# Patient Record
Sex: Female | Born: 1960 | Race: White | Hispanic: No | Marital: Married | Smoking: Never smoker
Health system: Southern US, Community
[De-identification: ages and names within clinical notes are randomized; demographics above are authoritative.]

## PROBLEM LIST (undated history)

## (undated) DIAGNOSIS — F32A Depression, unspecified: Secondary | ICD-10-CM

## (undated) DIAGNOSIS — M2040 Other hammer toe(s) (acquired), unspecified foot: Secondary | ICD-10-CM

## (undated) DIAGNOSIS — U071 COVID-19: Secondary | ICD-10-CM

## (undated) DIAGNOSIS — B019 Varicella without complication: Secondary | ICD-10-CM

## (undated) DIAGNOSIS — E119 Type 2 diabetes mellitus without complications: Secondary | ICD-10-CM

## (undated) DIAGNOSIS — Z973 Presence of spectacles and contact lenses: Secondary | ICD-10-CM

## (undated) DIAGNOSIS — F329 Major depressive disorder, single episode, unspecified: Secondary | ICD-10-CM

## (undated) DIAGNOSIS — Z8349 Family history of other endocrine, nutritional and metabolic diseases: Secondary | ICD-10-CM

## (undated) DIAGNOSIS — D649 Anemia, unspecified: Secondary | ICD-10-CM

## (undated) DIAGNOSIS — O24419 Gestational diabetes mellitus in pregnancy, unspecified control: Secondary | ICD-10-CM

## (undated) DIAGNOSIS — L439 Lichen planus, unspecified: Secondary | ICD-10-CM

## (undated) DIAGNOSIS — G5760 Lesion of plantar nerve, unspecified lower limb: Secondary | ICD-10-CM

## (undated) DIAGNOSIS — F419 Anxiety disorder, unspecified: Secondary | ICD-10-CM

## (undated) HISTORY — DX: Depression, unspecified: F32.A

## (undated) HISTORY — DX: Major depressive disorder, single episode, unspecified: F32.9

## (undated) HISTORY — DX: Lesion of plantar nerve, unspecified lower limb: G57.60

## (undated) HISTORY — DX: Anxiety disorder, unspecified: F41.9

## (undated) HISTORY — DX: Gestational diabetes mellitus in pregnancy, unspecified control: O24.419

## (undated) HISTORY — DX: Family history of other endocrine, nutritional and metabolic diseases: Z83.49

## (undated) HISTORY — DX: Varicella without complication: B01.9

## (undated) HISTORY — DX: Anemia, unspecified: D64.9

## (undated) HISTORY — DX: Other hammer toe(s) (acquired), unspecified foot: M20.40

## (undated) HISTORY — DX: Type 2 diabetes mellitus without complications: E11.9

---
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
## 1985-09-08 DIAGNOSIS — O24419 Gestational diabetes mellitus in pregnancy, unspecified control: Secondary | ICD-10-CM

## 1985-09-08 HISTORY — DX: Gestational diabetes mellitus in pregnancy, unspecified control: O24.419

## 2008-09-08 HISTORY — PX: ENDOMETRIAL ABLATION: SHX621

## 2010-10-10 LAB — HM COLONOSCOPY: HM Colonoscopy: NORMAL

## 2013-05-24 LAB — HEMOGLOBIN A1C: HEMOGLOBIN A1C: 5.6 % (ref 4.0–6.0)

## 2013-05-24 LAB — HEPATIC FUNCTION PANEL
ALT: 13 U/L (ref 7–35)
AST: 17 U/L (ref 13–35)
Alkaline Phosphatase: 57 U/L (ref 25–125)
BILIRUBIN, TOTAL: 0.4 mg/dL
Bilirubin, Direct: 0.12 mg/dL (ref 0.01–0.4)

## 2013-05-24 LAB — BASIC METABOLIC PANEL
BUN: 8 mg/dL (ref 4–21)
CREATININE: 0.6 mg/dL (ref 0.5–1.1)
Glucose: 90 mg/dL
POTASSIUM: 4.1 mmol/L (ref 3.4–5.3)
Sodium: 139 mmol/L (ref 137–147)

## 2013-05-24 LAB — LIPID PANEL
Cholesterol: 205 mg/dL — AB (ref 0–200)
HDL: 76 mg/dL — AB (ref 35–70)
LDL Cholesterol: 114 mg/dL
Triglycerides: 75 mg/dL (ref 40–160)

## 2013-05-24 LAB — CBC AND DIFFERENTIAL
HCT: 43 % (ref 36–46)
Hemoglobin: 14.2 g/dL (ref 12.0–16.0)
Platelets: 209 10*3/uL (ref 150–399)
WBC: 4.9 10*3/mL

## 2013-05-24 LAB — HM PAP SMEAR: HM Pap smear: NEGATIVE

## 2013-06-02 LAB — HM MAMMOGRAPHY: HM Mammogram: NORMAL

## 2015-04-03 ENCOUNTER — Encounter: Payer: Self-pay | Admitting: Family Medicine

## 2015-04-03 ENCOUNTER — Ambulatory Visit (INDEPENDENT_AMBULATORY_CARE_PROVIDER_SITE_OTHER): Payer: Managed Care, Other (non HMO) | Admitting: Family Medicine

## 2015-04-03 VITALS — BP 110/81 | HR 71 | Ht 60.0 in | Wt 178.0 lb

## 2015-04-03 DIAGNOSIS — M25551 Pain in right hip: Secondary | ICD-10-CM

## 2015-04-03 DIAGNOSIS — H9191 Unspecified hearing loss, right ear: Secondary | ICD-10-CM

## 2015-04-03 NOTE — Patient Instructions (Addendum)
Have record of last mammogram, colonoscopy and OBGYN records faxed to [place] at [phone]. I think it would also be helpful to have records from your time in [place]- ED visit and follow up after the ED.   We will call you within a week about your referral to ENT for right sided hearing loss and orthopedics for right hip pain. If you do not hear within 2 weeks, give [place] a call.   See me 3-6 months from now for a physical. Hopefully we will have records to review by then. Come in fasting and we will update your labs that day.   Happy to see you sooner if new issues arise

## 2015-04-03 NOTE — Progress Notes (Signed)
Brittany Reddish, MD Phone: [phone]  Subjective:  Patient presents today to establish care. Moved from [place] after living there last 1.5 years. Chief complaint-noted.   Hearing loss -Brother has otosclerosis. Patient states she has had an intermittent feeling of fullness or irritation in Left ear- has hard time explaining over 607 years but worse over last few months. R ear she notices if she is laying on the left- seems to have trouble hearing. Concerned about hearing loss in the right ear.  ROS- no headache, blurry vision  Right hip pain -1 year of difficulties, seems to be slightly worse. Issue primarily at night if lays on her right side. She tries to move around or try pillows between legs. Husband notices she moves in her sleep-may bewhen she is laying on that side. That leg twitches as if it is uncomfortable and wakes her husband up. Does also have left knee issues and history meniscal degenerative tear it sounds like.   She has pain in the daytime when she pushes on her lateral hip. No groin pain. She is still able to do yoga and gym 2-3x a week and walk or cycle another day of the week. Knows she is overweight and is workign on this.   ROS- no leg weakness, no shaking in daytime, can stop shaking if aware   The following were reviewed and entered/updated in epic: Past Medical History  Diagnosis Date  . Chicken pox   . Depression     Resolved. in 34s- meds and counseling.  . Gestational diabetes 1987    a1c q3 years  . Family history of hemochromatosis     father- screen iron  . Hammer toe    Patient Active Problem List   Diagnosis Date Noted  . Depression   . Gestational diabetes   . Family history of hemochromatosis    Past Surgical History  Procedure Laterality Date  . Endometrial ablation  2010    heavy menstrual bleeding  . Tonsillectomy and adenoidectomy  1965    Family History  Problem Relation Age of Onset  . Hemochromatosis Father   . Hearing  loss Brother     otosclerosis  . Stroke      grandparent  . Hypertension      grandparent    Medications- reviewed and updated No current outpatient prescriptions on file.   No current facility-administered medications for this visit.    Allergies-reviewed and updated Allergies  Allergen Reactions  . Sulfa Antibiotics   . Penicillins Rash    History   Social History  . Marital Status: Married    Spouse Name: N/A  . Number of Children: N/A  . Years of Education: N/A   Social History Main Topics  . Smoking status: Never Smoker   . Smokeless tobacco: Not on file  . Alcohol Use: 6.0 oz/week    10 Standard drinks or equivalent per week  . Drug Use: No  . Sexual Activity: Not on file   Other Topics Concern  . Not on file   Social History Narrative   Married. (husband [name] of Dr. Yong Channel). 2 children grown and married. Will be grandmother December 2016.    Has had 12 moves in 32 years, most recently [place] for 1.5 years, [place] 5 years prior, [place] prior      B.A. In nutrition. Masters in [employer]. In between jobs as Licensed conveyancer.           ROS--See HPI , otherwise full ROS was completed  and negative except as noted above  Objective: BP 110/81 mmHg  Pulse 71  Ht 5' (1.524 m)  Wt 178 lb (80.74 kg)  BMI 34.76 kg/m2 Gen: NAD, resting comfortably HEENT: Mucous membranes are moist. Oropharynx normal. TM normal. Eyes: sclera and lids normal, PERRLA Neck: no thyromegaly, no cervical lymphadenopathy CV: RRR no murmurs rubs or gallops Lungs: CTAB no crackles, wheeze, rhonchi Abdomen: soft/nontender/nondistended/normal bowel sounds. No rebound or guarding.  MSK: pain over right greater trochanter with deep palpation.  Ext: no edema Skin: warm, dry Neuro: 5/5 strength in upper and lower extremities, normal gait, normal reflexes   Assessment/Plan:  Hearing loss, right. No obvious abnormalities on exam but with left ear irritation, right ear  hearing loss reported, and family history of otosclerosis  - Plan: Ambulatory referral to ENT  Right hip pain - suspect right greater trochanteric bursitis. Patient is obese and discussed we could trial injection in office but thought better evaluated by orthopedics for potential injection and u/s guided if needed.   - Plan: Ambulatory referral to Orthopedic Surgery  Mammogram 06/02/13- patient to return for physical in next few months and we will get her set up.  She has endometrial ablation and not clear of menopause status (likely postmenopausal)- getting pap records though Workign on weight loss- has bachelor's in nutrition- knows what she needs to do just has to work towards it.   3-6 months for CPE  Orders Placed This Encounter  Procedures  . Ambulatory referral to Orthopedic Surgery    Referral Priority:  Routine    Referral Type:  Surgical    Referral Reason:  Specialty Services Required    Requested Specialty:  Orthopedic Surgery    Number of Visits Requested:  1  . Ambulatory referral to ENT    Referral Priority:  Routine    Referral Type:  Consultation    Referral Reason:  Specialty Services Required    Requested Specialty:  Otolaryngology    Number of Visits Requested:  1

## 2015-04-04 ENCOUNTER — Encounter: Payer: Self-pay | Admitting: Family Medicine

## 2015-04-04 DIAGNOSIS — F329 Major depressive disorder, single episode, unspecified: Secondary | ICD-10-CM | POA: Insufficient documentation

## 2015-04-04 DIAGNOSIS — Z8632 Personal history of gestational diabetes: Secondary | ICD-10-CM | POA: Insufficient documentation

## 2015-04-04 DIAGNOSIS — Z8349 Family history of other endocrine, nutritional and metabolic diseases: Secondary | ICD-10-CM | POA: Insufficient documentation

## 2015-04-04 DIAGNOSIS — F32A Depression, unspecified: Secondary | ICD-10-CM | POA: Insufficient documentation

## 2015-05-08 ENCOUNTER — Encounter: Payer: Self-pay | Admitting: Family Medicine

## 2015-06-20 ENCOUNTER — Telehealth: Payer: Self-pay | Admitting: Family Medicine

## 2015-06-20 NOTE — Telephone Encounter (Signed)
Pt said she spoke to you about her parents moving to [place] and becoming patients of yours. Your next available for Medicare patient is late Oct 2017. She said her parents will be moving to [facility] in Nov and is asking if you can see them sooner

## 2015-06-20 NOTE — Telephone Encounter (Signed)
See below

## 2015-06-21 NOTE — Telephone Encounter (Signed)
I dont remember the conversation but you can work them in within 6 months- does not have to be a Monday slot but prefer one of the 8:15, 8:45, 1:15, 1:45

## 2015-06-21 NOTE — Telephone Encounter (Signed)
See below

## 2015-06-21 NOTE — Telephone Encounter (Signed)
Daughter would like for them to come in this month but I will check your schedule and see what is available.

## 2015-07-10 ENCOUNTER — Encounter: Payer: Self-pay | Admitting: Family Medicine

## 2015-07-12 ENCOUNTER — Encounter: Payer: Self-pay | Admitting: Family Medicine

## 2015-07-31 ENCOUNTER — Other Ambulatory Visit (HOSPITAL_COMMUNITY)
Admission: RE | Admit: 2015-07-31 | Discharge: 2015-07-31 | Disposition: A | Discharge status: Home / Self Care | Payer: Managed Care, Other (non HMO) | Source: Ambulatory Visit | Attending: Family Medicine | Admitting: Family Medicine

## 2015-07-31 ENCOUNTER — Ambulatory Visit (INDEPENDENT_AMBULATORY_CARE_PROVIDER_SITE_OTHER): Payer: Managed Care, Other (non HMO) | Admitting: Family Medicine

## 2015-07-31 ENCOUNTER — Other Ambulatory Visit: Payer: Self-pay

## 2015-07-31 ENCOUNTER — Encounter: Payer: Self-pay | Admitting: Family Medicine

## 2015-07-31 VITALS — BP 120/82 | HR 87 | Temp 98.6°F | Ht 60.0 in | Wt 178.0 lb

## 2015-07-31 DIAGNOSIS — N898 Other specified noninflammatory disorders of vagina: Secondary | ICD-10-CM

## 2015-07-31 DIAGNOSIS — Z8632 Personal history of gestational diabetes: Secondary | ICD-10-CM

## 2015-07-31 DIAGNOSIS — Z0000 Encounter for general adult medical examination without abnormal findings: Secondary | ICD-10-CM

## 2015-07-31 DIAGNOSIS — Z1283 Encounter for screening for malignant neoplasm of skin: Secondary | ICD-10-CM

## 2015-07-31 DIAGNOSIS — Z01411 Encounter for gynecological examination (general) (routine) with abnormal findings: Secondary | ICD-10-CM | POA: Insufficient documentation

## 2015-07-31 DIAGNOSIS — N760 Acute vaginitis: Secondary | ICD-10-CM | POA: Insufficient documentation

## 2015-07-31 DIAGNOSIS — Z23 Encounter for immunization: Secondary | ICD-10-CM | POA: Diagnosis not present

## 2015-07-31 DIAGNOSIS — Z8349 Family history of other endocrine, nutritional and metabolic diseases: Secondary | ICD-10-CM

## 2015-07-31 DIAGNOSIS — Z1231 Encounter for screening mammogram for malignant neoplasm of breast: Secondary | ICD-10-CM

## 2015-07-31 LAB — HEMOGLOBIN A1C: HEMOGLOBIN A1C: 5.7 % (ref 4.6–6.5)

## 2015-07-31 LAB — COMPREHENSIVE METABOLIC PANEL
ALK PHOS: 55 U/L (ref 39–117)
ALT: 15 U/L (ref 0–35)
AST: 16 U/L (ref 0–37)
Albumin: 4.4 g/dL (ref 3.5–5.2)
BILIRUBIN TOTAL: 0.6 mg/dL (ref 0.2–1.2)
BUN: 12 mg/dL (ref 6–23)
CALCIUM: 9.3 mg/dL (ref 8.4–10.5)
CO2: 29 meq/L (ref 19–32)
Chloride: 100 mEq/L (ref 96–112)
Creatinine, Ser: 0.62 mg/dL (ref 0.40–1.20)
GFR: 106.12 mL/min (ref >60.00)
Glucose, Bld: 101 mg/dL — ABNORMAL HIGH (ref 70–99)
Potassium: 4.1 mEq/L (ref 3.5–5.1)
Sodium: 136 mEq/L (ref 135–145)
Total Protein: 6.9 g/dL (ref 6.0–8.3)

## 2015-07-31 LAB — CBC
HEMATOCRIT: 42.8 % (ref 36.0–46.0)
Hemoglobin: 14.3 g/dL (ref 12.0–15.0)
MCHC: 33.4 g/dL (ref 30.0–36.0)
MCV: 89.2 fl (ref 78.0–100.0)
Platelets: 175 10*3/uL (ref 150.0–400.0)
RBC: 4.8 Mil/uL (ref 3.87–5.11)
RDW: 13.6 % (ref 11.5–15.5)
WBC: 4.2 10*3/uL (ref 4.0–10.5)

## 2015-07-31 LAB — FERRITIN: Ferritin: 80.1 ng/mL (ref 10.0–291.0)

## 2015-07-31 LAB — LIPID PANEL
CHOLESTEROL: 230 mg/dL — AB (ref 0–200)
HDL: 83.2 mg/dL (ref >39.00)
LDL Cholesterol: 136 mg/dL — ABNORMAL HIGH (ref 0–99)
NONHDL: 147.13
Total CHOL/HDL Ratio: 3
Triglycerides: 54 mg/dL (ref 0.0–149.0)
VLDL: 10.8 mg/dL (ref 0.0–40.0)

## 2015-07-31 LAB — TSH: TSH: 2.41 u[IU]/mL (ref 0.35–4.50)

## 2015-07-31 NOTE — Progress Notes (Signed)
Brittany Reddish, MD Phone: [phone]  Subjective:  Patient presents today for their annual physical. Chief complaint-noted.   See problem oriented charting- ROS- full  review of systems was completed and negative including no depression, no blurry vision, shakiness, anxiety. No RUQ pain, chest pain, shortness of breath.   The following were reviewed and entered/updated in epic: Past Medical History  Diagnosis Date  . Chicken pox   . Depression     Resolved. in 62s- meds and counseling.  . Gestational diabetes 1987    a1c q3 years  . Family history of hemochromatosis     father- screen iron  . Hammer toe   . Morton's neuroma     per records   Patient Active Problem List   Diagnosis Date Noted  . Depression   . History of gestational diabetes   . Family history of hemochromatosis    Past Surgical History  Procedure Laterality Date  . Endometrial ablation  2010    heavy menstrual bleeding  . Tonsillectomy and adenoidectomy  1965    Family History  Problem Relation Age of Onset  . Hemochromatosis Father   . Hearing loss Brother     otosclerosis  . Stroke      grandparent  . Hypertension      grandparent    Medications- reviewed and updated No current outpatient prescriptions on file.   No current facility-administered medications for this visit.    Allergies-reviewed and updated Allergies  Allergen Reactions  . Sulfa Antibiotics   . Penicillins Rash    Social History   Social History  . Marital Status: Married    Spouse Name: N/A  . Number of Children: N/A  . Years of Education: N/A   Social History Main Topics  . Smoking status: Never Smoker   . Smokeless tobacco: None  . Alcohol Use: 6.0 oz/week    10 Standard drinks or equivalent per week  . Drug Use: No  . Sexual Activity: Not Asked   Other Topics Concern  . None   Social History Narrative   Married. (husband [name] of Dr. Yong Channel). 2 children grown and married. Will be  grandmother December 2016.    Has had 12 moves in 32 years, most recently [place] for 1.5 years, [place] 5 years prior, [place] prior      B.A. In nutrition. Masters in [employer]. In between jobs as Licensed conveyancer.           ROS--See HPI   Objective: BP 120/82 mmHg  Pulse 87  Temp(Src) 98.6 F (37 C)  Ht 5' (1.524 m)  Wt 178 lb (80.74 kg)  BMI 34.76 kg/m2 Gen: NAD, resting comfortably HEENT: Mucous membranes are moist. Oropharynx normal Physical Exam Breast exam normal GYN exam normal except some whitish discharge Neck: no thyromegaly CV: RRR no murmurs rubs or gallops Lungs: CTAB no crackles, wheeze, rhonchi Abdomen: soft/nontender/nondistended/normal bowel sounds. No rebound or guarding.  Ext: no edema Skin: warm, dry Neuro: grossly normal, moves all extremities, PERRLA   Assessment/Plan:  54 y.o. female presenting for annual physical.  Health Maintenance counseling: 1. Anticipatory guidance: Patient counseled regarding regular dental exams, eye exams, wearing seatbelts.  2. Risk factor reduction:  Advised patient of need for regular exercise and diet rich and fruits and vegetables to reduce risk of heart attack and stroke.  3. Immunizations/screenings/ancillary studies Health Maintenance Due  Topic Date Due  . Hepatitis C Screening - today 08[birth year]  . HIV Screening - - today 05/03/1975  .  TETANUS/TDAP - today  05/03/1979  . MAMMOGRAM - given handout 06/03/2015  4. Cervical cancer screening- pap smear today 5. Breast cancer screening-  breast exam today normal and mammogram needs to be updated 6. Colon cancer screening - 10/10/2010 with 10 year repeat 7. Skin cancer screening- would like a referral to dermatology [facility] Dr. Wilhemina Bonito  History gestational diabetes- repeat a1c today. Encouraged need for healthy eating, regular exercise, weight loss.  Lab Results  Component Value Date   HGBA1C 5.6 05/24/2013  Family history hemochromatosis Serum  transferrin saturation and ferritin along with testing for HFE gene-c282y and h63d mutation. Father is homozygous for h63d mutation  Vaginal discharge a few days- no STD testing requested. Will check BV, yeast through Pap  Orders Placed This Encounter  Procedures  . Flu Vaccine QUAD 36+ mos IM  . Tdap vaccine greater than or equal to 7yo IM  . CBC    [place]  . Comprehensive metabolic panel    [place]    Order Specific Question:  Has the patient fasted?    Answer:  No  . Lipid panel    [place]    Order Specific Question:  Has the patient fasted?    Answer:  No  . Hemoglobin A1c    [place]  . TSH    [place]  . Hemochromatosis DNA-PCR(c282y,h63d)  . Ferritin  . Transferrin Saturation  . Ambulatory referral to Dermatology    Referral Priority:  Routine    Referral Type:  Consultation    Referral Reason:  Specialty Services Required    Requested Specialty:  Dermatology    Number of Visits Requested:  1   Immunization History  Administered Date(s) Administered  . Influenza,inj,Quad PF,36+ Mos 07/31/2015  . Tdap 07/31/2015

## 2015-07-31 NOTE — Patient Instructions (Addendum)
Flu shot received today.  Labs today. Most labs will be back within 2 days. Some may take a few weeks- the hemochromatosis labs  We will call you within a week about your referral to dermatology. I vaguely remember he may be full and not accepting new patients- work around this if we have to If you do not hear within 2 weeks, give [place] a call.   Look forward to meeting your mom and dad as well as grandbaby through pictures!

## 2015-08-01 LAB — CYTOLOGY - PAP

## 2015-08-04 LAB — HEMOCHROMATOSIS DNA-PCR(C282Y,H63D)

## 2015-08-06 LAB — CERVICOVAGINAL ANCILLARY ONLY
Bacterial vaginitis: POSITIVE — AB
Candida vaginitis: NEGATIVE

## 2015-08-06 MED ORDER — METRONIDAZOLE 500 MG PO TABS: 500.0000 mg | ORAL_TABLET | Freq: Two times a day (BID) | ORAL | Status: DC

## 2015-08-06 NOTE — Addendum Note (Signed)
Addended by: Marin Olp on: 08/06/2015 05:27 PM   Modules accepted: Orders

## 2015-08-21 ENCOUNTER — Ambulatory Visit
Admission: RE | Admit: 2015-08-21 | Discharge: 2015-08-21 | Disposition: A | Discharge status: Home / Self Care | Payer: Managed Care, Other (non HMO) | Source: Ambulatory Visit

## 2015-08-21 DIAGNOSIS — Z1231 Encounter for screening mammogram for malignant neoplasm of breast: Secondary | ICD-10-CM

## 2015-10-10 ENCOUNTER — Ambulatory Visit (INDEPENDENT_AMBULATORY_CARE_PROVIDER_SITE_OTHER): Payer: Managed Care, Other (non HMO) | Admitting: Family Medicine

## 2015-10-10 ENCOUNTER — Encounter: Payer: Self-pay | Admitting: Family Medicine

## 2015-10-10 VITALS — BP 104/70 | HR 60 | Temp 98.5°F | Wt 178.0 lb

## 2015-10-10 DIAGNOSIS — J0100 Acute maxillary sinusitis, unspecified: Secondary | ICD-10-CM

## 2015-10-10 MED ORDER — AZITHROMYCIN 250 MG PO TABS: ORAL_TABLET | ORAL | Status: DC

## 2015-10-10 NOTE — Progress Notes (Signed)
PCP: Garret Reddish, MD  Subjective:  Brittany Barton is a 55 y.o. year old very pleasant female patient who presents with sinusitis symptoms including nasal congestion, sinus tenderness -other symptoms include: sore throat starting this morning, some headache. Ear pressure starting about 8 days ago -day of illness:5 -started: Ear pressure last Tuesday got worse after plane flight. On Saturday developed sinus pressure, congestion -Symptoms show no change -previous treatments: neti pot -sick contacts/travel/risks: denies flu exposure.  -Hx of: allergies  ROS-denies fever, SOB, NVD, tooth pain  Pertinent Past Medical History-  Patient Active Problem List   Diagnosis Date Noted  . Depression   . History of gestational diabetes   . Family history of hemochromatosis     Medications- reviewed , none prior to visit  Objective: BP 104/70 mmHg  Pulse 60  Temp(Src) 98.5 F (36.9 C)  Wt 178 lb (80.74 kg) Gen: NAD, resting comfortably HEENT: Turbinates erythematous with yellow drainage, TM normal, pharynx mildly erythematous with no tonsilar exudate or edema, minimal maxillary sinus tenderness CV: RRR no murmurs rubs or gallops Lungs: CTAB no crackles, wheeze, rhonchi Abdomen: soft/nontender/nondistended/normal bowel sounds. No rebound or guarding. obese Ext: no edema Skin: warm, dry, no rash Neuro: grossly normal, moves all extremities  Assessment/Plan:  Sinsusitis Viral based on <10 days, no double sickening, lack of severity of symptoms in first 3 days (fever above 101 and thick green drainage). We also discussed reasons why current illness does not meet criteria for bacterial illness and therefore no antibiotics indicated. Also educated on signs that bacterial infection may have developed on top of viral illness.   Treatment: -considered steroid: opted out due to history of gestational diabetes -other symptomatic care with plain mucinex may help -Antibiotic indicated: no, as I will  be out of town and to date symptoms have not improved, will write prescription for azithromycin to be filled on Monday if symptoms not at least 50% better  Finally, we reviewed reasons to return to care including if symptoms worsen or persist or new concerns arise.  Meds ordered this encounter  Medications  . azithromycin (ZITHROMAX) 250 MG tablet    Sig: Take 2 tabs on day 1, then 1 tab daily until finished    Dispense:  6 tablet    Refill:  0    May fill on 2/6 if symptoms not at least 50% or if febrile before that time or if symptoms improve then worsen

## 2015-10-10 NOTE — Patient Instructions (Signed)
Sinsusitis Viral based on <10 days, no double sickening, lack of severity of symptoms in first 3 days (fever above 101 and thick green drainage). We also discussed reasons why current illness does not meet criteria for bacterial illness and therefore no antibiotics indicated. Also educated on signs that bacterial infection may have developed on top of viral illness.   Treatment: -considered steroid: opted out due to history of gestational diabetes -other symptomatic care with plain mucinex may help -Antibiotic indicated: no, as I will be out of town and to date symptoms have not improved, will write prescription for azithromycin to be filled on Monday if symptoms not at least 50% better  Finally, we reviewed reasons to return to care including if symptoms worsen or persist or new concerns arise.  Meds ordered this encounter  Medications  . azithromycin (ZITHROMAX) 250 MG tablet    Sig: Take 2 tabs on day 1, then 1 tab daily until finished    Dispense:  6 tablet    Refill:  0    May fill on 2/6 if symptoms not at least 50% or if febrile before that time or if symptoms improve then worsen

## 2016-03-13 ENCOUNTER — Telehealth: Payer: Self-pay | Admitting: Family Medicine

## 2016-03-13 ENCOUNTER — Ambulatory Visit (INDEPENDENT_AMBULATORY_CARE_PROVIDER_SITE_OTHER): Payer: Managed Care, Other (non HMO) | Admitting: Family Medicine

## 2016-03-13 ENCOUNTER — Encounter: Payer: Self-pay | Admitting: Family Medicine

## 2016-03-13 VITALS — BP 132/86 | HR 60 | Temp 98.1°F | Ht 61.0 in | Wt 182.0 lb

## 2016-03-13 DIAGNOSIS — R0789 Other chest pain: Secondary | ICD-10-CM | POA: Diagnosis not present

## 2016-03-13 MED ORDER — MELOXICAM 15 MG PO TABS: 15.0000 mg | ORAL_TABLET | Freq: Every day | ORAL | Status: DC

## 2016-03-13 NOTE — Patient Instructions (Signed)
Seems you are tender at your xiphoid process- perhaps in the joint that connects it to the sternum- try to calm down with 7-10 days of antiinflammatory- can also ice area 3x a day for 15 minutes.   Follow up if continued or worsening symptoms

## 2016-03-13 NOTE — Telephone Encounter (Signed)
Patient Name: Brittany Barton  DOB: 1961/02/18    Initial Comment Caller has a pain where bra is in the center where the rib bones area. has a small lump.    Nurse Assessment  Nurse: Raphael Gibney, RN, Vanita Ingles Date/Time (Eastern Time): 03/13/2016 1:38:24 PM  Confirm and document reason for call. If symptomatic, describe symptoms. You must click the next button to save text entered. ---Caller states she has a small lump on her sternum.Marland Kitchen No pain. Has pain with lifting her grandchild. Not red. Noticed lump on Saturday when she lifted her granddaughter.  Has the patient traveled out of the country within the last 30 days? ---Not Applicable  Does the patient have any new or worsening symptoms? ---Yes  Will a triage be completed? ---Yes  Related visit to physician within the last 2 weeks? ---No  Does the PT have any chronic conditions? (i.e. diabetes, asthma, etc.) ---No  Is this a behavioral health or substance abuse call? ---No     Guidelines    Guideline Title Affirmed Question Affirmed Notes  Skin Lump or Localized Swelling [1] New-onset hernia suspected (reducible bulge in groin or abdomen; non-tender) AND [2] NO pain or vomiting    Final Disposition User   See PCP When Office is Open (within 3 days) Raphael Gibney, RN, Vanita Ingles    Comments  Appt scheduled for 4:15 pm 03/13/2016 with Dr. Garret Reddish   Referrals  REFERRED TO PCP OFFICE   Disagree/Comply: Comply

## 2016-03-13 NOTE — Progress Notes (Signed)
Subjective:  Brittany Barton is a 55 y.o. year old very pleasant female patient who presents for/with See problem oriented charting ROS- no shortness of breath, no nausea, no vomiting. Pain not worse after meals- always wtth movement.see any ROS included in HPI as well.   Past Medical History-  Patient Active Problem List   Diagnosis Date Noted  . Depression   . History of gestational diabetes   . Family history of hemochromatosis     Medications- reviewed and updated Current Outpatient Prescriptions  Medication Sig Dispense Refill  . Multiple Vitamins-Minerals (CENTRUM FLAVOR BURST ADULT) CHEW Chew by mouth daily.    . Omega-3 Fatty Acids (OMEGA 3 PO) Take by mouth daily.       Objective: BP 132/86 mmHg  Pulse 60  Temp(Src) 98.1 F (36.7 C) (Oral)  Ht 5\' 1"  (1.549 m)  Wt 182 lb (82.555 kg)  BMI 34.41 kg/m2 Gen: NAD, resting comfortably CV: RRR no murmurs rubs or gallops Lungs: CTAB no crackles, wheeze, rhonchi Abdomen: soft/nontender over entirety of abdomen with exception over xiphod process which patient eventually guards on repeat exam/nondistended/normal bowel sounds. No rebound or guarding otherwise Ext: no edema Skin: warm, dry, no rash over skin Neuro: grossly normal, moves all extremities  Assessment/Plan:  Lower chest pain S: Had some pain in upper abdomen, lower chest when lifted her grandbaby up and felt a pop. Perhaps vague discomfort for a few months including with sit ups- "feels weird there". Then worse starting on Saturday. Some mild intermittent pain- if she sits a certain way it bothers her A/P:on exam patient is very tender over xiphoid process- ? Mild inflammation at joint with sternum- will trial mobic for 7- 10 days and advised follow up if not improved. High suspicion this is MSK in origin. Consider more abdominal focused workup if above not effective vs sports medicine input. Also advised trial of ice. Doubt fracture but possible- could also consider  films  Meds ordered this encounter  Medications  . Multiple Vitamins-Minerals (CENTRUM FLAVOR BURST ADULT) CHEW    Sig: Chew by mouth daily.  . Omega-3 Fatty Acids (OMEGA 3 PO)    Sig: Take by mouth daily.  . meloxicam (MOBIC) 15 MG tablet    Sig: Take 1 tablet (15 mg total) by mouth daily.    Dispense:  10 tablet    Refill:  0    Return precautions advised.  Garret Reddish, MD

## 2016-05-19 IMAGING — MG MM DIGITAL SCREENING BILAT
4 series · 4 of 4 positions shown · non-contrast
Comparison: None.

CLINICAL DATA: Screening.

EXAM:
DIGITAL SCREENING BILATERAL MAMMOGRAM WITH CAD

[L CC]
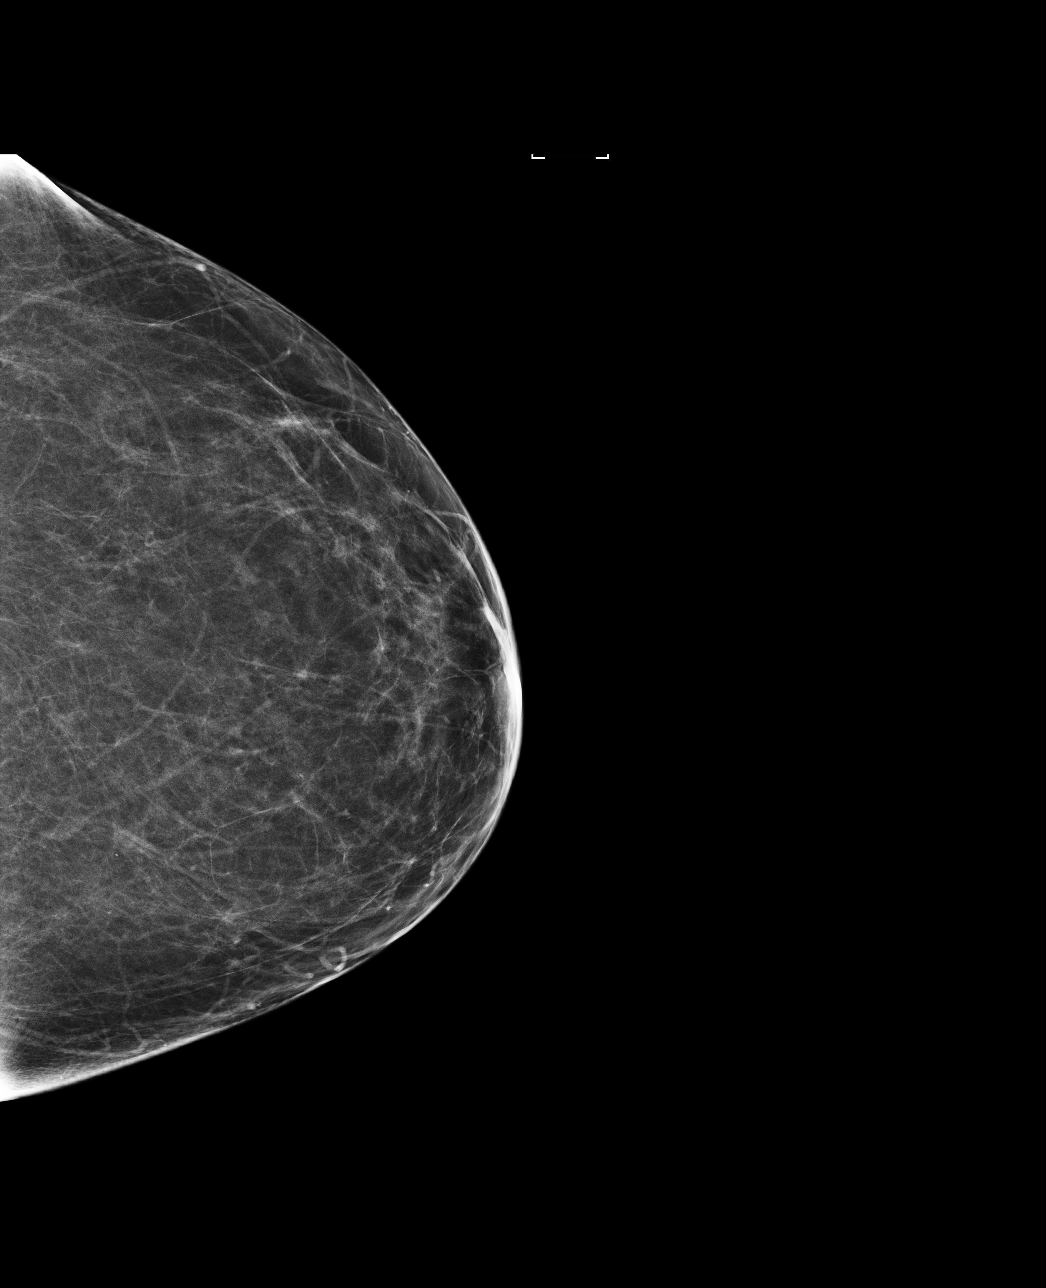

[R CC]
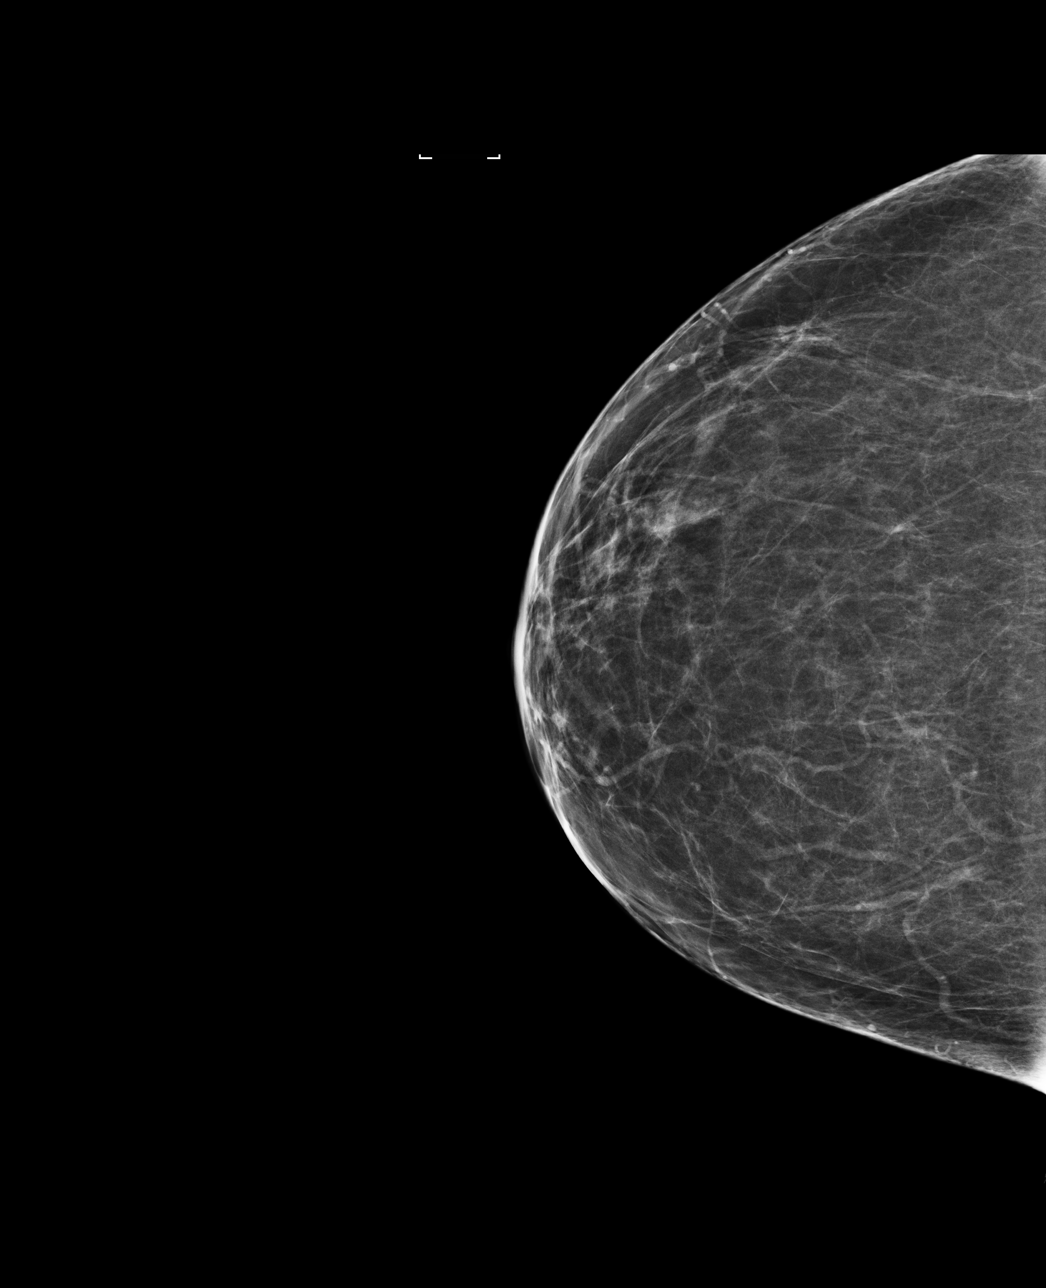

[R MLO]
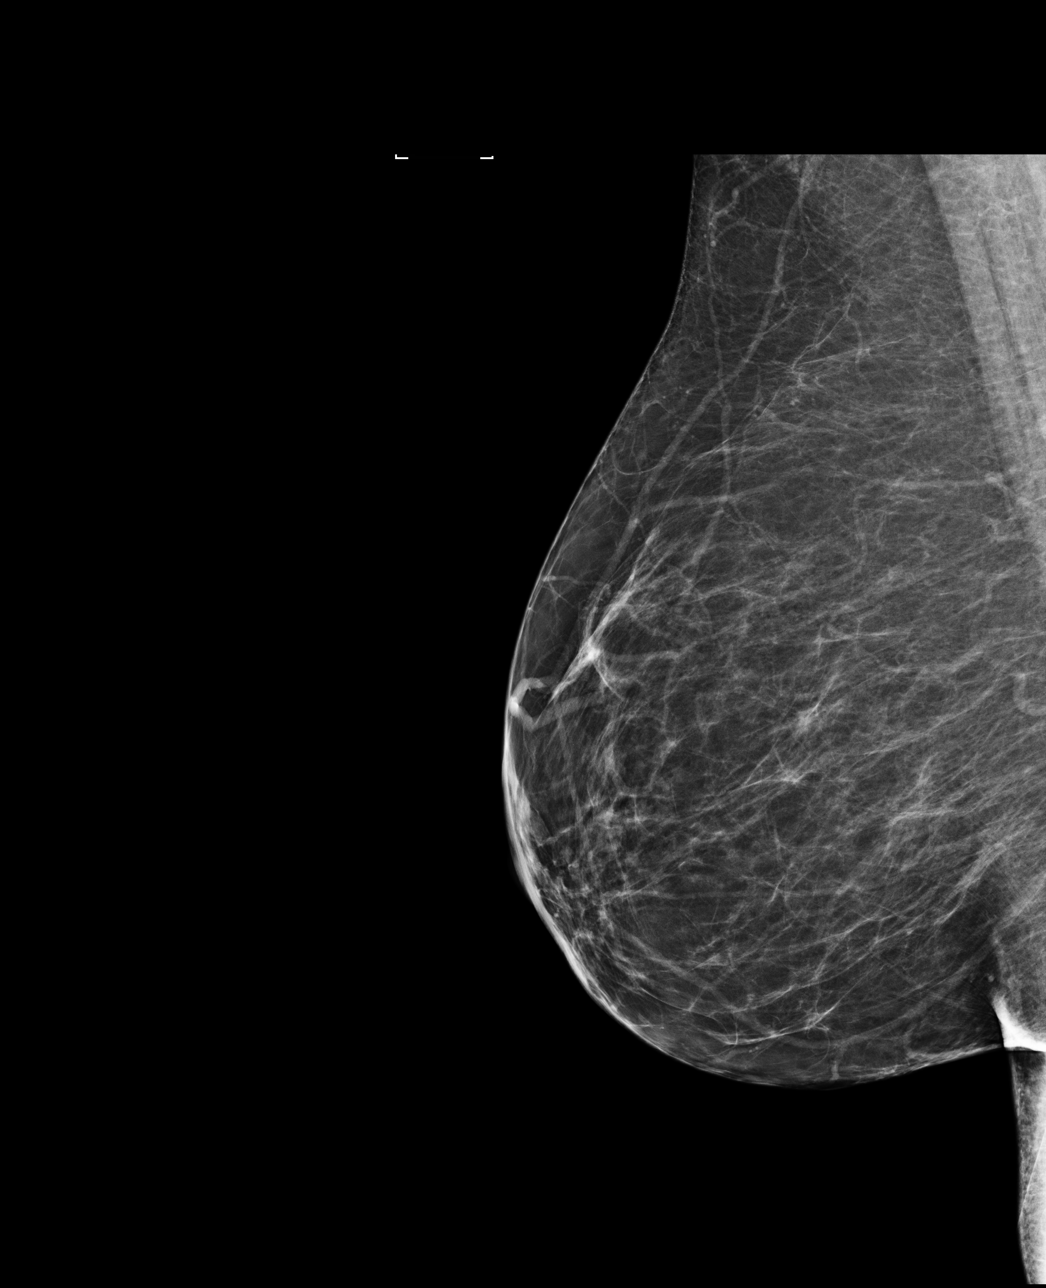

[L MLO]
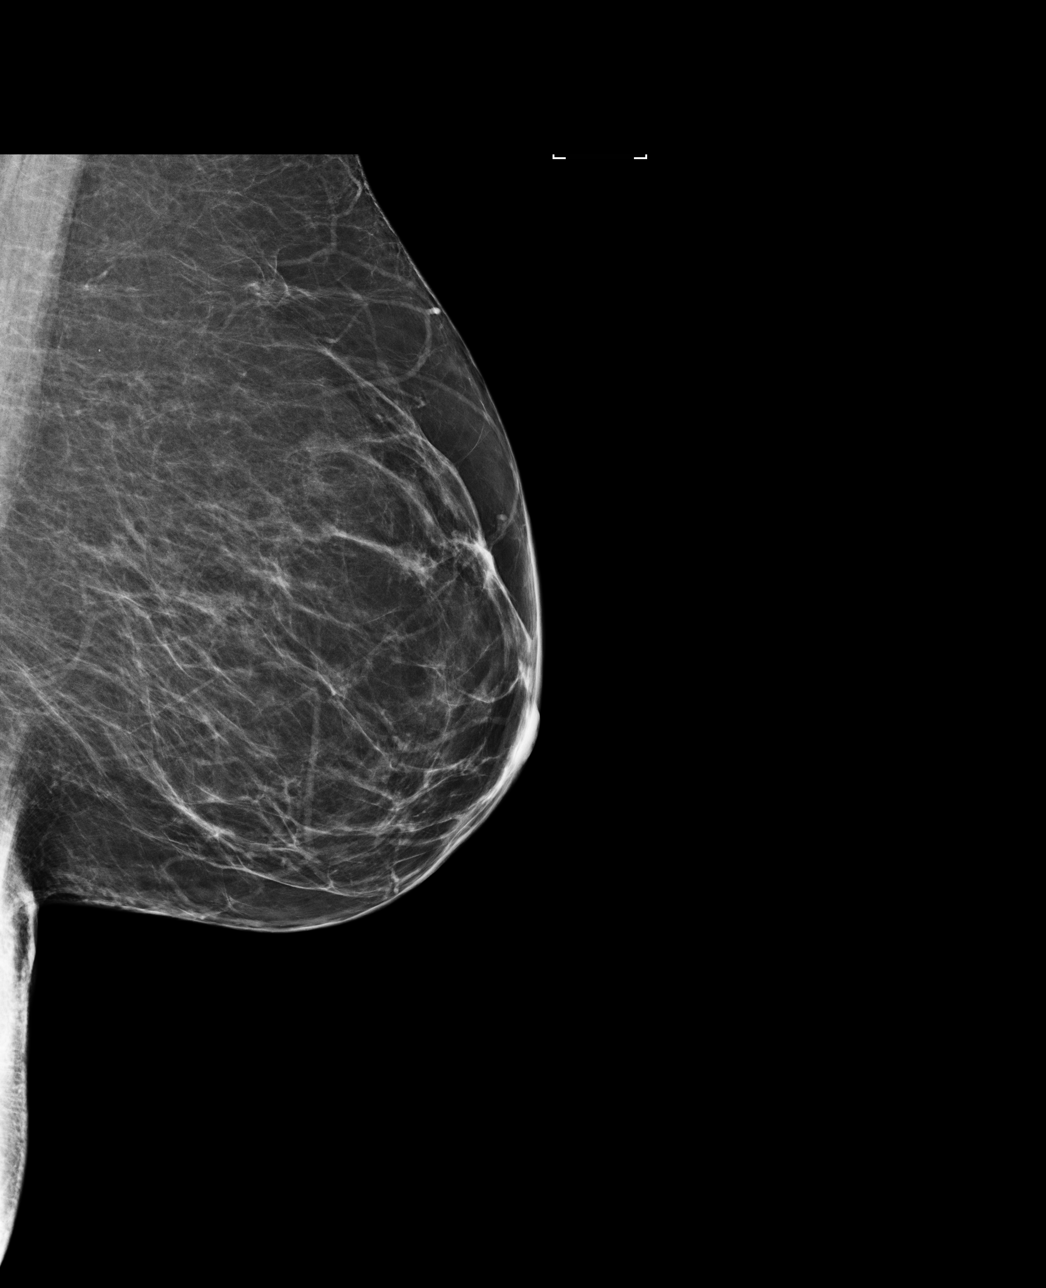

[4 of 4 positions shown; findings below may reference images not displayed]

ACR Breast Density Category b: There are scattered areas of
fibroglandular density.
FINDINGS: There are no findings suspicious for malignancy. Images were
processed with CAD.
IMPRESSION: No mammographic evidence of malignancy. A result letter of this
screening mammogram will be mailed directly to the patient.

RECOMMENDATION:
Screening mammogram in one year. (Code:[id number])

BI-RADS CATEGORY  1: Negative.

## 2016-07-08 LAB — HM DIABETES EYE EXAM

## 2016-07-29 ENCOUNTER — Other Ambulatory Visit (INDEPENDENT_AMBULATORY_CARE_PROVIDER_SITE_OTHER): Payer: Managed Care, Other (non HMO)

## 2016-07-29 DIAGNOSIS — Z0000 Encounter for general adult medical examination without abnormal findings: Secondary | ICD-10-CM | POA: Diagnosis not present

## 2016-07-29 LAB — POC URINALSYSI DIPSTICK (AUTOMATED)
Bilirubin, UA: NEGATIVE
Blood, UA: NEGATIVE
Glucose, UA: NEGATIVE
Ketones, UA: NEGATIVE
Nitrite, UA: NEGATIVE
PH UA: 7
PROTEIN UA: NEGATIVE
SPEC GRAV UA: 1.01
UROBILINOGEN UA: 0.2

## 2016-07-29 LAB — CBC WITH DIFFERENTIAL/PLATELET
BASOS PCT: 0.3 % (ref 0.0–3.0)
Basophils Absolute: 0 10*3/uL (ref 0.0–0.1)
EOS PCT: 1.9 % (ref 0.0–5.0)
Eosinophils Absolute: 0.1 10*3/uL (ref 0.0–0.7)
HEMATOCRIT: 42 % (ref 36.0–46.0)
HEMOGLOBIN: 14.6 g/dL (ref 12.0–15.0)
LYMPHS PCT: 33.4 % (ref 12.0–46.0)
Lymphs Abs: 1.4 10*3/uL (ref 0.7–4.0)
MCHC: 34.7 g/dL (ref 30.0–36.0)
MCV: 85.4 fl (ref 78.0–100.0)
MONOS PCT: 8 % (ref 3.0–12.0)
Monocytes Absolute: 0.3 10*3/uL (ref 0.1–1.0)
Neutro Abs: 2.4 10*3/uL (ref 1.4–7.7)
Neutrophils Relative %: 56.4 % (ref 43.0–77.0)
Platelets: 229 10*3/uL (ref 150.0–400.0)
RBC: 4.92 Mil/uL (ref 3.87–5.11)
RDW: 13 % (ref 11.5–15.5)
WBC: 4.2 10*3/uL (ref 4.0–10.5)

## 2016-07-29 LAB — BASIC METABOLIC PANEL
BUN: 6 mg/dL (ref 6–23)
CHLORIDE: 98 meq/L (ref 96–112)
CO2: 28 mEq/L (ref 19–32)
Calcium: 9.3 mg/dL (ref 8.4–10.5)
Creatinine, Ser: 0.59 mg/dL (ref 0.40–1.20)
GFR: 111.97 mL/min (ref >60.00)
Glucose, Bld: 131 mg/dL — ABNORMAL HIGH (ref 70–99)
POTASSIUM: 4.1 meq/L (ref 3.5–5.1)
Sodium: 134 mEq/L — ABNORMAL LOW (ref 135–145)

## 2016-07-29 LAB — HEPATIC FUNCTION PANEL
ALT: 11 U/L (ref 0–35)
AST: 13 U/L (ref 0–37)
Albumin: 4.4 g/dL (ref 3.5–5.2)
Alkaline Phosphatase: 63 U/L (ref 39–117)
Bilirubin, Direct: 0.1 mg/dL (ref 0.0–0.3)
TOTAL PROTEIN: 6.9 g/dL (ref 6.0–8.3)
Total Bilirubin: 0.6 mg/dL (ref 0.2–1.2)

## 2016-07-29 LAB — LIPID PANEL
CHOL/HDL RATIO: 3
CHOLESTEROL: 177 mg/dL (ref 0–200)
HDL: 61.9 mg/dL (ref >39.00)
LDL Cholesterol: 101 mg/dL — ABNORMAL HIGH (ref 0–99)
NonHDL: 115.56
TRIGLYCERIDES: 73 mg/dL (ref 0.0–149.0)
VLDL: 14.6 mg/dL (ref 0.0–40.0)

## 2016-07-29 LAB — TSH: TSH: 1.36 u[IU]/mL (ref 0.35–4.50)

## 2016-08-04 ENCOUNTER — Ambulatory Visit (INDEPENDENT_AMBULATORY_CARE_PROVIDER_SITE_OTHER): Payer: Managed Care, Other (non HMO) | Admitting: Family Medicine

## 2016-08-04 ENCOUNTER — Encounter: Payer: Self-pay | Admitting: Family Medicine

## 2016-08-04 VITALS — BP 108/78 | HR 66 | Temp 97.9°F | Ht 60.0 in | Wt 164.6 lb

## 2016-08-04 DIAGNOSIS — Z23 Encounter for immunization: Secondary | ICD-10-CM

## 2016-08-04 DIAGNOSIS — Z0000 Encounter for general adult medical examination without abnormal findings: Secondary | ICD-10-CM | POA: Diagnosis not present

## 2016-08-04 DIAGNOSIS — E785 Hyperlipidemia, unspecified: Secondary | ICD-10-CM | POA: Insufficient documentation

## 2016-08-04 DIAGNOSIS — R739 Hyperglycemia, unspecified: Secondary | ICD-10-CM

## 2016-08-04 DIAGNOSIS — B001 Herpesviral vesicular dermatitis: Secondary | ICD-10-CM | POA: Insufficient documentation

## 2016-08-04 LAB — POCT GLYCOSYLATED HEMOGLOBIN (HGB A1C): Hemoglobin A1C: 5.9

## 2016-08-04 MED ORDER — ACYCLOVIR 5 % EX OINT: 1.0000 "application " | TOPICAL_OINTMENT | CUTANEOUS | 1 refills | Status: DC

## 2016-08-04 NOTE — Progress Notes (Signed)
Phone: [phone]  Subjective:  Patient presents today for their annual physical. Chief complaint-noted.   See problem oriented charting- ROS- full  review of systems was completed and negative except for: occasional pain in epigastric area once every few months. No chest pain or shortness of breath with this. Weight loss but intentional. No night sweats.   The following were reviewed and entered/updated in epic: Past Medical History:  Diagnosis Date  . Chicken pox   . Depression    Resolved. in 59s- meds and counseling.  . Family history of hemochromatosis    father- screen iron  . Gestational diabetes 1987   a1c q3 years  . Hammer toe   . Morton's neuroma    per records   Patient Active Problem List   Diagnosis Date Noted  . History of gestational diabetes     Priority: Medium  . Fever blister 08/04/2016    Priority: Low  . Depression     Priority: Low  . Family history of hemochromatosis     Priority: Low  . Hyperlipidemia 08/04/2016   Past Surgical History:  Procedure Laterality Date  . ENDOMETRIAL ABLATION  2010   heavy menstrual bleeding  . TONSILLECTOMY AND ADENOIDECTOMY  1965    Family History  Problem Relation Age of Onset  . Hemochromatosis Father   . Hearing loss Brother     otosclerosis  . Stroke      grandparent  . Hypertension      grandparent    Medications- reviewed and updated Current Outpatient Prescriptions  Medication Sig Dispense Refill  . acyclovir ointment (ZOVIRAX) 5 % Apply 1 application topically every 3 (three) hours. 30 g 1   No current facility-administered medications for this visit.     Allergies-reviewed and updated Allergies  Allergen Reactions  . Sulfa Antibiotics   . Penicillins Rash    Social History   Social History  . Marital status: Married    Spouse name: N/A  . Number of children: N/A  . Years of education: N/A   Social History Main Topics  . Smoking status: Never Smoker  . Smokeless tobacco:  None  . Alcohol use 6.0 oz/week    10 Standard drinks or equivalent per week  . Drug use: No  . Sexual activity: Not Asked   Other Topics Concern  . None   Social History Narrative   Married. (husband [name] of Dr. Yong Channel). 2 children grown and married. Will be grandmother December 2016.    Has had 12 moves in 32 years, most recently [place] for 1.5 years, [place] 5 years prior, [place] prior      B.A. In nutrition. Masters in [employer]. In between jobs as Licensed conveyancer.           Objective: BP 108/78 (BP Location: Left Arm, Patient Position: Sitting, Cuff Size: Normal)   Pulse 66   Temp 97.9 F (36.6 C) (Oral)   Ht 5' (1.524 m)   Wt 164 lb 9.6 oz (74.7 kg)   SpO2 99%   BMI 32.15 kg/m  Gen: NAD, resting comfortably HEENT: Mucous membranes are moist. Oropharynx normal Neck: no thyromegaly CV: RRR no murmurs rubs or gallops Lungs: CTAB no crackles, wheeze, rhonchi Abdomen: soft/nontender/nondistended/normal bowel sounds. No rebound or guarding.  Ext: no edema Skin: warm, dry Neuro: grossly normal, moves all extremities, PERRLA  Assessment/Plan:  55 y.o. female presenting for annual physical.  Health Maintenance counseling: 1. Anticipatory guidance: Patient counseled regarding regular dental exams, eye exams, wearing seatbelts.  2. Risk factor reduction:  Advised patient of need for regular exercise and diet rich and fruits and vegetables to reduce risk of heart attack and stroke.  3. Immunizations/screenings/ancillary studies Immunization History  Administered Date(s) Administered  . Influenza,inj,Quad PF,36+ Mos 07/31/2015  . Tdap 07/31/2015   Health Maintenance Due  Topic Date Due  . Hepatitis C Screening - declined Nov 26, 1960  . HIV Screening -- declined 05/03/1975  . INFLUENZA VACCINE - today 04/08/2016   4. Cervical cancer screening- pap smear 2016- discussed every 3 year recommendation 5. Breast cancer screening-  breast exam (defers until  next gyn exam) and mammogram 08/21/15 normal 6. Colon cancer screening - 10/10/2010 with 10 year repeat 7. Skin cancer screening- saw dermatology last year but was told did not need regular follow up.   Status of chronic or acute concerns  fever blisters- used zovirax in past. About to run out- refilled today  History of gestational diabetes History gestational diabetes- a1c q3 years. Then a1c 5.7 in 2016 and cbg 131 2017 (may not have been fasting completely though).  Get a1c today. Has lost over 15 lbs by going to Essentially vegan since July 6th. Drastic improvement in lipids with bad cholesterol down to 101 from 136. Total down from 230 to 177. Good cholesterol down some as well. Down 17.5 pounds with lifestyle change. Less achy. Had been low on meats but now cut out cheese/all dairy and eggs which did a fair amount of.   Hyperlipidemia See hyperglycemia- weight down 15- primarily vegan.  Lipids look much better- continue current efforts alone- no meds for now  1 year physical  Meds ordered this encounter  Medications  . acyclovir ointment (ZOVIRAX) 5 %    Sig: Apply 1 application topically every 3 (three) hours.    Dispense:  30 g    Refill:  1   Return precautions advised.   Garret Reddish, MD

## 2016-08-04 NOTE — Patient Instructions (Addendum)
Flu shot today.   Great job with weight loss and healthier eating- cholesterol looks a lot better! Wonder if sugar was affected by communion because your a1c remains in at risk range at 5.9. Up slightly- likely would have been worse without your changes to lifestyle. Keep it up Lab Results  Component Value Date   HGBA1C 5.7 07/31/2015   Can check in 6 months if you would like for repeat

## 2016-08-04 NOTE — Assessment & Plan Note (Signed)
History gestational diabetes- a1c q3 years. Then a1c 5.7 in 2016 and cbg 131 2017 (may not have been fasting completely though).  Get a1c today. Has lost over 15 lbs by going to Essentially vegan since July 6th. Drastic improvement in lipids with bad cholesterol down to 101 from 136. Total down from 230 to 177. Good cholesterol down some as well. Down 17.5 pounds with lifestyle change. Less achy. Had been low on meats but now cut out cheese/all dairy and eggs which did a fair amount of.

## 2016-08-04 NOTE — Progress Notes (Signed)
Pre visit review using our clinic review tool, if applicable. No additional management support is needed unless otherwise documented below in the visit note. 

## 2016-08-04 NOTE — Assessment & Plan Note (Signed)
See hyperglycemia- weight down 15- primarily vegan.  Lipids look much better- continue current efforts alone- no meds for now

## 2017-08-05 ENCOUNTER — Encounter: Payer: Self-pay | Admitting: Family Medicine

## 2017-08-05 ENCOUNTER — Ambulatory Visit (INDEPENDENT_AMBULATORY_CARE_PROVIDER_SITE_OTHER): Payer: Managed Care, Other (non HMO) | Admitting: Family Medicine

## 2017-08-05 ENCOUNTER — Other Ambulatory Visit (HOSPITAL_COMMUNITY)
Admission: RE | Admit: 2017-08-05 | Discharge: 2017-08-05 | Disposition: A | Discharge status: Home / Self Care | Payer: Managed Care, Other (non HMO) | Source: Ambulatory Visit | Attending: Family Medicine | Admitting: Family Medicine

## 2017-08-05 VITALS — BP 94/76 | HR 62 | Temp 98.1°F | Ht 60.0 in | Wt 146.0 lb

## 2017-08-05 DIAGNOSIS — B9689 Other specified bacterial agents as the cause of diseases classified elsewhere: Secondary | ICD-10-CM | POA: Insufficient documentation

## 2017-08-05 DIAGNOSIS — Z0000 Encounter for general adult medical examination without abnormal findings: Secondary | ICD-10-CM

## 2017-08-05 DIAGNOSIS — R739 Hyperglycemia, unspecified: Secondary | ICD-10-CM

## 2017-08-05 DIAGNOSIS — E785 Hyperlipidemia, unspecified: Secondary | ICD-10-CM | POA: Diagnosis not present

## 2017-08-05 DIAGNOSIS — Z23 Encounter for immunization: Secondary | ICD-10-CM

## 2017-08-05 DIAGNOSIS — N898 Other specified noninflammatory disorders of vagina: Secondary | ICD-10-CM | POA: Diagnosis present

## 2017-08-05 DIAGNOSIS — Z124 Encounter for screening for malignant neoplasm of cervix: Secondary | ICD-10-CM

## 2017-08-05 MED ORDER — ACYCLOVIR 5 % EX OINT: 1.0000 "application " | TOPICAL_OINTMENT | CUTANEOUS | 1 refills | Status: AC

## 2017-08-05 NOTE — Patient Instructions (Addendum)
Schedule a lab visit at the check out desk within 2 weeks. Return for future fasting labs meaning nothing but water after midnight please. Ok to take your medications with water.   Great job with weight loss! Go spread your knowledge Wt Readings from Last 3 Encounters:  08/05/17 146 lb (66.2 kg)  08/04/16 164 lb 9.6 oz (74.7 kg)  03/13/16 182 lb (82.6 kg)   Update your mammogram

## 2017-08-05 NOTE — Progress Notes (Signed)
Phone: [phone]  Subjective:  Patient presents today for their annual physical. Chief complaint-noted.   See problem oriented charting- ROS- full  review of systems was completed and negative including No chest pain or shortness of breath. No headache or blurry vision. Does have one fever blister from around thanksgiving  The following were reviewed and entered/updated in epic: Past Medical History:  Diagnosis Date  . Chicken pox   . Depression    Resolved. in 23s- meds and counseling.  . Family history of hemochromatosis    father- screen iron  . Gestational diabetes 1987   a1c q3 years  . Hammer toe   . Morton's neuroma    per records   Patient Active Problem List   Diagnosis Date Noted  . History of gestational diabetes     Priority: Medium  . Fever blister 08/04/2016    Priority: Low  . Depression     Priority: Low  . Family history of hemochromatosis     Priority: Low  . Hyperlipidemia 08/04/2016   Past Surgical History:  Procedure Laterality Date  . ENDOMETRIAL ABLATION  2010   heavy menstrual bleeding  . TONSILLECTOMY AND ADENOIDECTOMY  1965    Family History  Problem Relation Age of Onset  . Hemochromatosis Father   . Hearing loss Brother        otosclerosis  . Stroke Unknown        grandparent  . Hypertension Unknown        grandparent    Medications- reviewed and updated Current Outpatient Medications  Medication Sig Dispense Refill  . acyclovir ointment (ZOVIRAX) 5 % Apply 1 application topically every 3 (three) hours. 30 g 1   No current facility-administered medications for this visit.     Allergies-reviewed and updated Allergies  Allergen Reactions  . Sulfa Antibiotics   . Penicillins Rash    Social History   Socioeconomic History  . Marital status: Married    Spouse name: None  . Number of children: None  . Years of education: None  . Highest education level: None  Social Needs  . Financial resource strain: None  . Food  insecurity - worry: None  . Food insecurity - inability: None  . Transportation needs - medical: None  . Transportation needs - non-medical: None  Occupational History  . None  Tobacco Use  . Smoking status: Never Smoker  . Smokeless tobacco: Never Used  Substance and Sexual Activity  . Alcohol use: Yes    Alcohol/week: 6.0 oz    Types: 10 Standard drinks or equivalent per week  . Drug use: No  . Sexual activity: Yes  Other Topics Concern  . None  Social History Narrative   Married. (husband [name] of Dr. Yong Channel). 2 children grown and married. Will be grandmother December 2016.    Has had 12 moves in 32 years, most recently [place] for 1.5 years, [place] 5 years prior, [place] prior      B.A. In nutrition. Masters in [employer]. In between jobs as Licensed conveyancer.        Objective: BP 94/76 (BP Location: Left Arm, Patient Position: Sitting, Cuff Size: Large)   Pulse 62   Temp 98.1 F (36.7 C) (Oral)   Ht 5' (1.524 m)   Wt 146 lb (66.2 kg)   SpO2 97%   BMI 28.51 kg/m  Gen: NAD, resting comfortably HEENT: Mucous membranes are moist. Oropharynx normal Neck: no thyromegaly CV: RRR no murmurs rubs or gallops Lungs: CTAB no  crackles, wheeze, rhonchi Abdomen: soft/nontender/nondistended/normal bowel sounds. No rebound or guarding.  Ext: no edema Skin: warm, dry Neuro: grossly normal, moves all extremities, PERRLA  Breasts: normal appearance, no masses or tenderness Pelvic: cervix normal in appearance, external genitalia normal, no adnexal masses or tenderness, no cervical motion tenderness, uterus normal size, shape, and consistency and vagina normal without discharge   Assessment/Plan:  56 y.o. female presenting for annual physical.  Health Maintenance counseling: 1. Anticipatory guidance: Patient counseled regarding regular dental exams q6 months, eye exams , wearing seatbelts.  2. Risk factor reduction:  Advised patient of need for regular exercise and  diet rich and fruits and vegetables to reduce risk of heart attack and stroke. Exercise- regular exercise. Diet-primarily plant based nonprocessed foods.  Wt Readings from Last 3 Encounters:  08/05/17 146 lb (66.2 kg)  08/04/16 164 lb 9.6 oz (74.7 kg)  03/13/16 182 lb (82.6 kg)   3. Immunizations/screenings/ancillary studies. Discuss shingrix next year if we have available.  Immunization History  Administered Date(s) Administered  . Influenza,inj,Quad PF,6+ Mos 07/31/2015, 08/04/2016, 08/05/2017  . Tdap 07/31/2015   4. Cervical cancer screening- 07/31/15 last pap smear, wants repeat this year 5. Breast cancer screening-  breast exam today normal and mammogram  08/21/15 on record- she will get repeat this year. May move to every year scheduling 6. Colon cancer screening - 10/2010 with 10 year repeat 7. Skin cancer screening- advised regular sunscreen use. Denies worrisome, changing, or new skin lesions. Looked at one area on thigh - benign on right thigh 8. Osteoporosis screening at 24- will plan on that  Status of chronic or acute concerns   Zovirax prn for fever blisters  Hyperglycemia last year- weight was 164 at that time and has lost an additional 18 lbs in addition to 18 lbs last year. Discussed option of repeating a1c - Brother diagnosed with diabetes and she wants to check Lab Results  Component Value Date   HGBA1C 5.9 08/04/2016   Hyperlipidemia- drastic improvement last year- repeat again now. Primarily vegan diet- plant based non processed foods.   1 year CPE  Orders Placed This Encounter  Procedures  . Flu Vaccine QUAD 36+ mos IM  . Comprehensive metabolic panel    [place]    Standing Status:   Future    Standing Expiration Date:   08/05/2018  . CBC    Standing Status:   Future    Standing Expiration Date:   08/05/2018  . Lipid panel    Standing Status:   Future    Standing Expiration Date:   08/05/2018  . Hemoglobin A1c    [place]    Standing Status:   Future      Standing Expiration Date:   08/05/2018    Meds ordered this encounter  Medications  . acyclovir ointment (ZOVIRAX) 5 %    Sig: Apply 1 application topically every 3 (three) hours.    Dispense:  30 g    Refill:  1    Return precautions advised.  Garret Reddish, MD

## 2017-08-06 ENCOUNTER — Other Ambulatory Visit (INDEPENDENT_AMBULATORY_CARE_PROVIDER_SITE_OTHER): Payer: Managed Care, Other (non HMO)

## 2017-08-06 DIAGNOSIS — E785 Hyperlipidemia, unspecified: Secondary | ICD-10-CM

## 2017-08-06 DIAGNOSIS — Z0000 Encounter for general adult medical examination without abnormal findings: Secondary | ICD-10-CM | POA: Diagnosis not present

## 2017-08-06 DIAGNOSIS — R739 Hyperglycemia, unspecified: Secondary | ICD-10-CM | POA: Diagnosis not present

## 2017-08-06 LAB — COMPREHENSIVE METABOLIC PANEL
ALT: 10 U/L (ref 0–35)
AST: 15 U/L (ref 0–37)
Albumin: 4.3 g/dL (ref 3.5–5.2)
Alkaline Phosphatase: 63 U/L (ref 39–117)
BUN: 5 mg/dL — AB (ref 6–23)
CO2: 29 mEq/L (ref 19–32)
CREATININE: 0.54 mg/dL (ref 0.40–1.20)
Calcium: 9.3 mg/dL (ref 8.4–10.5)
Chloride: 100 mEq/L (ref 96–112)
GFR: 123.56 mL/min (ref >60.00)
GLUCOSE: 145 mg/dL — AB (ref 70–99)
POTASSIUM: 4 meq/L (ref 3.5–5.1)
SODIUM: 134 meq/L — AB (ref 135–145)
Total Bilirubin: 0.7 mg/dL (ref 0.2–1.2)
Total Protein: 6.9 g/dL (ref 6.0–8.3)

## 2017-08-06 LAB — LIPID PANEL
CHOLESTEROL: 167 mg/dL (ref 0–200)
HDL: 63.9 mg/dL (ref >39.00)
LDL Cholesterol: 91 mg/dL (ref 0–99)
NONHDL: 103.53
Total CHOL/HDL Ratio: 3
Triglycerides: 63 mg/dL (ref 0.0–149.0)
VLDL: 12.6 mg/dL (ref 0.0–40.0)

## 2017-08-06 LAB — CYTOLOGY - PAP: Diagnosis: NEGATIVE

## 2017-08-06 LAB — CBC
HCT: 42.5 % (ref 36.0–46.0)
HEMOGLOBIN: 14.3 g/dL (ref 12.0–15.0)
MCHC: 33.7 g/dL (ref 30.0–36.0)
MCV: 89.7 fl (ref 78.0–100.0)
Platelets: 170 10*3/uL (ref 150.0–400.0)
RBC: 4.74 Mil/uL (ref 3.87–5.11)
RDW: 12.8 % (ref 11.5–15.5)
WBC: 4.3 10*3/uL (ref 4.0–10.5)

## 2017-08-06 LAB — HEMOGLOBIN A1C: Hgb A1c MFr Bld: 6.6 % — ABNORMAL HIGH (ref 4.6–6.5)

## 2017-12-03 ENCOUNTER — Encounter: Payer: Self-pay | Admitting: Family Medicine

## 2017-12-03 ENCOUNTER — Ambulatory Visit (INDEPENDENT_AMBULATORY_CARE_PROVIDER_SITE_OTHER): Payer: Managed Care, Other (non HMO) | Admitting: Family Medicine

## 2017-12-03 VITALS — BP 92/64 | HR 58 | Temp 98.4°F | Ht 60.0 in | Wt 132.2 lb

## 2017-12-03 DIAGNOSIS — E1165 Type 2 diabetes mellitus with hyperglycemia: Secondary | ICD-10-CM

## 2017-12-03 DIAGNOSIS — R739 Hyperglycemia, unspecified: Secondary | ICD-10-CM

## 2017-12-03 LAB — POCT GLYCOSYLATED HEMOGLOBIN (HGB A1C): Hemoglobin A1C: 7.6

## 2017-12-03 NOTE — Patient Instructions (Addendum)
Lets try 30 minutes of daily exercise  Try smaller more frequent meals  Check blood sugar every other morning with goal 70-130.  If you happen to check 2 hours after meals would like for blood sugars to be below 180.   If you are seeing #s above this and not coming down over time- call us and I will call in metformin. Could start out even with just 567m once a day (goes up to [id number] a day)  Check blood sugar anytime you think it may be low as well.   See me beginning of July (preferably not July 5th as I often take that day off)     Hypoglycemia Hypoglycemia is when the sugar (glucose) level in the blood is too low. Symptoms of low blood sugar may include:  Feeling: ? Hungry. ? Worried or nervous (anxious). ? Sweaty and clammy. ? Confused. ? Dizzy. ? Sleepy. ? Sick to your stomach (nauseous).  Having: ? A fast heartbeat. ? A headache. ? A change in your vision. ? Jerky movements that you cannot control (seizure). ? Nightmares. ? Tingling or no feeling (numbness) around the mouth, lips, or tongue.  Having trouble with: ? Talking. ? Paying attention (concentrating). ? Moving (coordination). ? Sleeping.  Shaking.  Passing out (fainting).  Getting upset easily (irritability).  Low blood sugar can happen to people who have diabetes and people who do not have diabetes. Low blood sugar can happen quickly, and it can be an emergency. Treating Low Blood Sugar Low blood sugar is often treated by eating or drinking something sugary right away. If you can think clearly and swallow safely, follow the 15:15 rule:  Take 15 grams of a fast-acting carb (carbohydrate). Some fast-acting carbs are: ? 1 tube of glucose gel. ? 3 sugar tablets (glucose pills). ? 6-8 pieces of hard candy. ? 4 oz (120 mL) of fruit juice. ? 4 oz (120 mL) of regular (not diet) soda.  Check your blood sugar 15 minutes after you take the carb.  If your blood sugar is still at or below 70 mg/dL  (3.9 mmol/L), take 15 grams of a carb again.  If your blood sugar does not go above 70 mg/dL (3.9 mmol/L) after 3 tries, get help right away.  After your blood sugar goes back to normal, eat a meal or a snack within 1 hour.  Treating Very Low Blood Sugar If your blood sugar is at or below 54 mg/dL (3 mmol/L), you have very low blood sugar (severe hypoglycemia). This is an emergency. Do not wait to see if the symptoms will go away. Get medical help right away. Call your local emergency services (911 in the [place] Do not drive yourself to the hospital. If you have very low blood sugar and you cannot eat or drink, you may need a glucagon shot (injection). A family member or friend should learn how to check your blood sugar and how to give you a glucagon shot. Ask your doctor if you need to have a glucagon shot kit at home. Follow these instructions at home: General instructions  Avoid any diets that cause you to not eat enough food. Talk with your doctor before you start any new diet.  Take over-the-counter and prescription medicines only as told by your doctor.  Limit alcohol to no more than 1 drink per day for nonpregnant women and 2 drinks per day for men. One drink equals 12 oz of beer, 5 oz of wine, or 1 oz  of hard liquor.  Keep all follow-up visits as told by your doctor. This is important. If You Have Diabetes:   Make sure you know the symptoms of low blood sugar.  Always keep a source of sugar with you, such as: ? Sugar. ? Sugar tablets. ? Glucose gel. ? Fruit juice. ? Regular soda (not diet soda). ? Milk. ? Hard candy. ? Honey.  Take your medicines as told.  Follow your exercise and meal plan. ? Eat on time. Do not skip meals. ? Follow your sick day plan when you cannot eat or drink normally. Make this plan ahead of time with your doctor.  Check your blood sugar as often as told by your doctor. Always check before and after exercise.  Share your diabetes care plan  with: ? Your work or school. ? People you live with.  Check your pee (urine) for ketones: ? When you are sick. ? As told by your doctor.  Carry a card or wear jewelry that says you have diabetes. If You Have Low Blood Sugar From Other Causes:   Check your blood sugar as often as told by your doctor.  Follow instructions from your doctor about what you cannot eat or drink. Contact a doctor if:  You have trouble keeping your blood sugar in your target range.  You have low blood sugar often. Get help right away if:  You still have symptoms after you eat or drink something sugary.  Your blood sugar is at or below 54 mg/dL (3 mmol/L).  You have jerky movements that you cannot control.  You pass out. These symptoms may be an emergency. Do not wait to see if the symptoms will go away. Get medical help right away. Call your local emergency services (911 in the [place] Do not drive yourself to the hospital. This information is not intended to replace advice given to you by your health care provider. Make sure you discuss any questions you have with your health care provider. Document Released: 11/19/2009 Document Revised: 01/31/2016 Document Reviewed: 09/28/2015 Elsevier Interactive Patient Education  [employer].

## 2017-12-03 NOTE — Progress Notes (Signed)
Subjective:  Brittany Barton is a 57 y.o. year old very pleasant female patient who presents for/with See problem oriented charting ROS- occasionally feels slightly shaky after long walks. Some polyuria if eats really sugary foods- rare. No shortness of breath. Occasional cold sores/fever blisters   Past Medical History-  Patient Active Problem List   Diagnosis Date Noted  . Poorly controlled type 2 diabetes mellitus ([place]) 12/03/2017    Priority: High  . Hyperlipidemia 08/04/2016    Priority: Medium  . History of gestational diabetes     Priority: Medium  . Fever blister 08/04/2016    Priority: Low  . Depression     Priority: Low  . Family history of hemochromatosis     Priority: Low    Medications- reviewed and updated Current Outpatient Medications  Medication Sig Dispense Refill  . acyclovir ointment (ZOVIRAX) 5 % Apply 1 application topically every 3 (three) hours. 30 g 1  . Cholecalciferol (D3 HIGH POTENCY) 2000 units CAPS Take 1 capsule by mouth daily.    . Omega-3 Fatty Acids (OMEGA-3 CF PO) Take 250 mg by mouth daily.    . vitamin B-12 (CYANOCOBALAMIN) 250 MCG tablet Take 250 mcg by mouth daily.     No current facility-administered medications for this visit.     Objective: BP 92/64 (BP Location: Left Arm, Patient Position: Sitting, Cuff Size: Normal)   Pulse (!) 58   Temp 98.4 F (36.9 C) (Oral)   Ht 5' (1.524 m)   Wt 132 lb 3.2 oz (60 kg)   SpO2 98%   BMI 25.82 kg/m  Gen: NAD, resting comfortably. She is upset about the diabetes diagnosis which is understandable.   Assessment/Plan:  Poorly controlled type 2 diabetes mellitus ([place]) S: hyperglycemia history in 2017 with weight at 164. She lost 36 lbs over 16 month period! Primarily vegan diet- plant based non processed foods.  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 She was concerned because brother has diabetes.   Even with this drastic changes- she had a fasting CBG of 145 and a1c of 6.6. She was going to further  tighten diet and present for follow up. We discussed metformin option at that time which she declined .  Lab Results  Component Value Date   HGBA1C 7.6 12/03/2017  A/P: Patient now down 50 lbs from peak and a1c still trending up. I told her I was concerned about "pancreas burn out". Therefore asked her to start checking sugars and if trending up would start metformin- she prefers to not start at this time. Follow up 3 months and 1 day  From avs "Lets try 30 minutes of daily exercise  Try smaller more frequent meals  Check blood sugar every other morning with goal 70-130.  If you happen to check 2 hours after meals would like for blood sugars to be below 180.   If you are seeing #s above this and not coming down over time- call us and I will call in metformin. Could start out even with just 500mg  once a day (goes up to 1000mg  twice a day)  Check blood sugar anytime you think it may be low as well.   See me beginning of July (preferably not July 5th as I often take that day off) "  3 months and 1 day Lab/Order associations: Poorly controlled type 2 diabetes mellitus ([place])  Hyperglycemia - Plan: POCT glycosylated hemoglobin (Hb A1C)  Time Stamp The duration of face-to-face time during this visit was greater than 15  minutes. Greater than 50% of this time was spent in counseling, explanation of diagnosis, planning of further management, and/or coordination of care including discussion of new diabetes diagnosis, blood sguar levels and correlation with a1c, options for diabetes control.    Return precautions advised.  Garret Reddish, MD

## 2017-12-03 NOTE — Assessment & Plan Note (Signed)
S: hyperglycemia history in 2017 with weight at 164. She lost 36 lbs over 16 month period! Primarily vegan diet- plant based non processed foods.  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 She was concerned because brother has diabetes.   Even with this drastic changes- she had a fasting CBG of 145 and a1c of 6.6. She was going to further tighten diet and present for follow up. We discussed metformin option at that time which she declined .  Lab Results  Component Value Date   HGBA1C 7.6 12/03/2017  A/P: Patient now down 50 lbs from peak and a1c still trending up. I told her I was concerned about "pancreas burn out". Therefore asked her to start checking sugars and if trending up would start metformin- she prefers to not start at this time. Follow up 3 months and 1 day  From avs "Lets try 30 minutes of daily exercise  Try smaller more frequent meals  Check blood sugar every other morning with goal 70-130.  If you happen to check 2 hours after meals would like for blood sugars to be below 180.   If you are seeing #s above this and not coming down over time- call us and I will call in metformin. Could start out even with just 500mg  once a day (goes up to 1000mg  twice a day)  Check blood sugar anytime you think it may be low as well.   See me beginning of July (preferably not July 5th as I often take that day off) "

## 2017-12-07 ENCOUNTER — Encounter: Payer: Self-pay | Admitting: Family Medicine

## 2017-12-07 ENCOUNTER — Other Ambulatory Visit: Payer: Self-pay

## 2017-12-07 DIAGNOSIS — R739 Hyperglycemia, unspecified: Secondary | ICD-10-CM

## 2017-12-07 MED ORDER — GLUCOSE BLOOD VI STRP: ORAL_STRIP | 3 refills | Status: DC

## 2017-12-07 MED ORDER — ACCU-CHEK SOFT TOUCH LANCETS MISC
3 refills | Status: DC
Start: 2017-12-07 — End: 2020-10-16

## 2018-03-09 ENCOUNTER — Ambulatory Visit (INDEPENDENT_AMBULATORY_CARE_PROVIDER_SITE_OTHER): Payer: Managed Care, Other (non HMO) | Admitting: Family Medicine

## 2018-03-09 ENCOUNTER — Encounter: Payer: Self-pay | Admitting: Family Medicine

## 2018-03-09 VITALS — BP 98/70 | HR 50 | Temp 97.8°F | Ht 60.0 in | Wt 119.2 lb

## 2018-03-09 DIAGNOSIS — E785 Hyperlipidemia, unspecified: Secondary | ICD-10-CM | POA: Diagnosis not present

## 2018-03-09 DIAGNOSIS — Z23 Encounter for immunization: Secondary | ICD-10-CM | POA: Diagnosis not present

## 2018-03-09 DIAGNOSIS — E119 Type 2 diabetes mellitus without complications: Secondary | ICD-10-CM

## 2018-03-09 LAB — POCT GLYCOSYLATED HEMOGLOBIN (HGB A1C): HEMOGLOBIN A1C: 5.4 % (ref 4.0–5.6)

## 2018-03-09 NOTE — Patient Instructions (Addendum)
Health Maintenance Due  Topic Date Due  . PNEUMOCOCCAL POLYSACCHARIDE VACCINE (1)-completed today 05/02/1962  . FOOT EXAM -completed today 05/02/1970  . OPHTHALMOLOGY EXAM -please call and schedule 05/02/1970  . URINE MICROALBUMIN -completed today 05/02/1970  . MAMMOGRAM -please call and schedule 08/20/2017   Please stop by lab before you go  See you in December- cant wait to see the path God leads you down to help others

## 2018-03-09 NOTE — Assessment & Plan Note (Signed)
S: suspect improved control from last lipids- has lost significant weight and eating plant based diet Lab Results  Component Value Date   CHOL 167 08/06/2017   HDL 63.90 08/06/2017   LDLCALC 91 08/06/2017   TRIG 63.0 08/06/2017   CHOLHDL 3 08/06/2017   A/P: will update lipids before next visit- suspect we will see improvements. Unless LDL over 100 would not consider statin even though diabetic given how well a1c is now controlled and optimal lifestyle she is living. With that being said- would love to see LDL under 70

## 2018-03-09 NOTE — Progress Notes (Signed)
Subjective:  Brittany Barton is a 57 y.o. year old very pleasant female patient who presents for/with See problem oriented charting ROS- No chest pain or shortness of breath. No headache or blurry vision.  Has had intentional weight loss- she feels great overall   Past Medical History-  Patient Active Problem List   Diagnosis Date Noted  . Poorly controlled type 2 diabetes mellitus ([place]) 12/03/2017    Priority: High  . Hyperlipidemia 08/04/2016    Priority: Medium  . History of gestational diabetes     Priority: Medium  . Fever blister 08/04/2016    Priority: Low  . Depression     Priority: Low  . Family history of hemochromatosis     Priority: Low    Medications- reviewed and updated Current Outpatient Medications  Medication Sig Dispense Refill  . acyclovir ointment (ZOVIRAX) 5 % Apply 1 application topically every 3 (three) hours. 30 g 1  . Cholecalciferol (D3 HIGH POTENCY) 2000 units CAPS Take 1 capsule by mouth daily.    Marland Kitchen glucose blood test strip Use to test blood sugar daily 100 each 3  . Lancets (ACCU-CHEK SOFT TOUCH) lancets Use to test blood sugar daily 100 each 3  . Omega-3 Fatty Acids (OMEGA-3 CF PO) Take 250 mg by mouth daily.    . vitamin B-12 (CYANOCOBALAMIN) 250 MCG tablet Take 250 mcg by mouth daily.     No current facility-administered medications for this visit.     Objective: BP 98/70 (BP Location: Left Arm, Patient Position: Sitting, Cuff Size: Normal)   Pulse (!) 50   Temp 97.8 F (36.6 C) (Oral)   Ht 5' (1.524 m)   Wt 119 lb 3.2 oz (54.1 kg)   SpO2 97%   BMI 23.28 kg/m  Gen: NAD, resting comfortably Notable weight loss  Assessment/Plan:  Weight down 63 lbs from her peak a year ago!!!  Poorly controlled type 2 diabetes mellitus ([place]) S: VERY well controlled on no rx- weight now down to ideal BMI and a1c outside of diabetes range. She is down another 13 lbs since last viist.   3 big things- DAILY  exercise as prescription in her mind, strong  avoidance of high glycemic foods using whole food plant based, portion control Lab Results  Component Value Date   HGBA1C 5.4 03/09/2018   HGBA1C 7.6 12/03/2017   HGBA1C 6.6 (H) 08/06/2017   A/P: thrilled with patient improvement as she is. Continue lifestyle changes.   Patient has done such a phenomenal job and seems passionate about helping others. I encouraged her to consider finding a way to help others such as a blog or perhaps finishing her RD and then doing diabetes education.  Hyperlipidemia S: suspect improved control from last lipids- has lost significant weight and eating plant based diet Lab Results  Component Value Date   CHOL 167 08/06/2017   HDL 63.90 08/06/2017   LDLCALC 91 08/06/2017   TRIG 63.0 08/06/2017   CHOLHDL 3 08/06/2017   A/P: will update lipids before next visit- suspect we will see improvements. Unless LDL over 100 would not consider statin even though diabetic given how well a1c is now controlled and optimal lifestyle she is living. With that being said- would love to see LDL under 70     Future Appointments  Date Time Provider [place]  08/04/2018  8:15 AM [facility]  08/11/2018  8:30 AM Marin Olp, MD [facility]   Lab/Order associations: Type II diabetes mellitus, well  controlled ([place]) - Plan: POCT glycosylated hemoglobin (Hb A1C), Hemoglobin A1c, CBC, Comprehensive metabolic panel, Lipid panel, Urinalysis, Microalbumin / creatinine urine ratio, CANCELED: Microalbumin / creatinine urine ratio  Need for prophylactic vaccination against Streptococcus pneumoniae (pneumococcus) - Plan: Pneumococcal polysaccharide vaccine 23-valent greater than or equal to 2yo subcutaneous/IM  Need for prophylactic vaccination and inoculation against varicella - Plan: Varicella-zoster vaccine IM  Hyperlipidemia, unspecified hyperlipidemia type - Plan: CBC, Comprehensive metabolic panel, Lipid panel  Return precautions advised.  Garret Reddish, MD

## 2018-03-09 NOTE — Assessment & Plan Note (Signed)
S: VERY well controlled on no rx- weight now down to ideal BMI and a1c outside of diabetes range. She is down another 13 lbs since last viist.   3 big things- DAILY  exercise as prescription in her mind, strong avoidance of high glycemic foods using whole food plant based, portion control Lab Results  Component Value Date   HGBA1C 5.4 03/09/2018   HGBA1C 7.6 12/03/2017   HGBA1C 6.6 (H) 08/06/2017   A/P: thrilled with patient improvement as she is. Continue lifestyle changes.   Patient has done such a phenomenal job and seems passionate about helping others. I encouraged her to consider finding a way to help others such as a blog or perhaps finishing her RD and then doing diabetes education.

## 2018-03-30 LAB — HM DIABETES EYE EXAM

## 2018-08-04 ENCOUNTER — Other Ambulatory Visit (INDEPENDENT_AMBULATORY_CARE_PROVIDER_SITE_OTHER): Payer: Managed Care, Other (non HMO)

## 2018-08-04 DIAGNOSIS — E119 Type 2 diabetes mellitus without complications: Secondary | ICD-10-CM

## 2018-08-04 DIAGNOSIS — E785 Hyperlipidemia, unspecified: Secondary | ICD-10-CM

## 2018-08-04 LAB — COMPREHENSIVE METABOLIC PANEL
ALBUMIN: 4.5 g/dL (ref 3.5–5.2)
ALT: 13 U/L (ref 0–35)
AST: 16 U/L (ref 0–37)
Alkaline Phosphatase: 56 U/L (ref 39–117)
BILIRUBIN TOTAL: 0.8 mg/dL (ref 0.2–1.2)
BUN: 8 mg/dL (ref 6–23)
CALCIUM: 8.9 mg/dL (ref 8.4–10.5)
CHLORIDE: 95 meq/L — AB (ref 96–112)
CO2: 28 mEq/L (ref 19–32)
Creatinine, Ser: 0.5 mg/dL (ref 0.40–1.20)
GFR: 134.57 mL/min (ref >60.00)
Glucose, Bld: 114 mg/dL — ABNORMAL HIGH (ref 70–99)
Potassium: 3.8 mEq/L (ref 3.5–5.1)
SODIUM: 129 meq/L — AB (ref 135–145)
Total Protein: 6.4 g/dL (ref 6.0–8.3)

## 2018-08-04 LAB — LIPID PANEL
CHOL/HDL RATIO: 2
Cholesterol: 149 mg/dL (ref 0–200)
HDL: 71.7 mg/dL (ref >39.00)
LDL Cholesterol: 69 mg/dL (ref 0–99)
NONHDL: 76.92
Triglycerides: 38 mg/dL (ref 0.0–149.0)
VLDL: 7.6 mg/dL (ref 0.0–40.0)

## 2018-08-04 LAB — MICROALBUMIN / CREATININE URINE RATIO
CREATININE, U: 22.7 mg/dL
MICROALB/CREAT RATIO: 3.1 mg/g (ref 0.0–30.0)
Microalb, Ur: <0.7 mg/dL (ref 0.0–1.9)

## 2018-08-04 LAB — URINALYSIS, ROUTINE W REFLEX MICROSCOPIC
Bilirubin Urine: NEGATIVE
Ketones, ur: NEGATIVE
NITRITE: NEGATIVE
RBC / HPF: NONE SEEN (ref 0–?)
Specific Gravity, Urine: <=1.005 — AB (ref 1.000–1.030)
Total Protein, Urine: NEGATIVE
Urine Glucose: NEGATIVE
Urobilinogen, UA: 0.2 (ref 0.0–1.0)
pH: 7.5 (ref 5.0–8.0)

## 2018-08-04 LAB — CBC
HCT: 40.8 % (ref 36.0–46.0)
Hemoglobin: 14.1 g/dL (ref 12.0–15.0)
MCHC: 34.4 g/dL (ref 30.0–36.0)
MCV: 89.4 fl (ref 78.0–100.0)
PLATELETS: 157 10*3/uL (ref 150.0–400.0)
RBC: 4.57 Mil/uL (ref 3.87–5.11)
RDW: 13.2 % (ref 11.5–15.5)
WBC: 2.5 10*3/uL — ABNORMAL LOW (ref 4.0–10.5)

## 2018-08-04 LAB — HEMOGLOBIN A1C: Hgb A1c MFr Bld: 6 % (ref 4.6–6.5)

## 2018-08-11 ENCOUNTER — Encounter: Payer: Self-pay | Admitting: Obstetrics and Gynecology

## 2018-08-11 ENCOUNTER — Ambulatory Visit (INDEPENDENT_AMBULATORY_CARE_PROVIDER_SITE_OTHER): Payer: Managed Care, Other (non HMO) | Admitting: Family Medicine

## 2018-08-11 ENCOUNTER — Encounter: Payer: Self-pay | Admitting: Family Medicine

## 2018-08-11 ENCOUNTER — Other Ambulatory Visit: Payer: Self-pay | Admitting: Family Medicine

## 2018-08-11 ENCOUNTER — Ambulatory Visit
Admission: RE | Admit: 2018-08-11 | Discharge: 2018-08-11 | Disposition: A | Discharge status: Home / Self Care | Payer: Managed Care, Other (non HMO) | Source: Ambulatory Visit | Attending: Family Medicine | Admitting: Family Medicine

## 2018-08-11 VITALS — BP 98/70 | HR 57 | Temp 98.2°F | Ht 60.0 in | Wt 112.8 lb

## 2018-08-11 DIAGNOSIS — Z23 Encounter for immunization: Secondary | ICD-10-CM | POA: Diagnosis not present

## 2018-08-11 DIAGNOSIS — F325 Major depressive disorder, single episode, in full remission: Secondary | ICD-10-CM

## 2018-08-11 DIAGNOSIS — Z0000 Encounter for general adult medical examination without abnormal findings: Secondary | ICD-10-CM | POA: Diagnosis not present

## 2018-08-11 DIAGNOSIS — Z1231 Encounter for screening mammogram for malignant neoplasm of breast: Secondary | ICD-10-CM

## 2018-08-11 DIAGNOSIS — E119 Type 2 diabetes mellitus without complications: Secondary | ICD-10-CM | POA: Diagnosis not present

## 2018-08-11 DIAGNOSIS — N814 Uterovaginal prolapse, unspecified: Secondary | ICD-10-CM | POA: Diagnosis not present

## 2018-08-11 NOTE — Progress Notes (Signed)
Phone: [phone]  Subjective:  Patient presents today for their annual physical. Chief complaint-noted.   See problem oriented charting- ROS- full  review of systems was completed and negative except for: prolapsed tissue through vagina- able to reduce this  The following were reviewed and entered/updated in epic: Past Medical History:  Diagnosis Date  . Chicken pox   . Depression    Resolved. in 50s- meds and counseling.  . Family history of hemochromatosis    father- screen iron  . Gestational diabetes 1987   a1c q3 years  . Hammer toe   . Morton's neuroma    per records   Patient Active Problem List   Diagnosis Date Noted  . Diabetes mellitus type II, controlled ([place]) 12/03/2017    Priority: High  . Hyperlipidemia 08/04/2016    Priority: Medium  . History of gestational diabetes     Priority: Medium  . Fever blister 08/04/2016    Priority: Low  . Depression     Priority: Low  . Family history of hemochromatosis     Priority: Low   Past Surgical History:  Procedure Laterality Date  . ENDOMETRIAL ABLATION  2010   heavy menstrual bleeding  . TONSILLECTOMY AND ADENOIDECTOMY  1965    Family History  Problem Relation Age of Onset  . Hemochromatosis Father   . Alcoholism Father   . Hearing loss Brother        otosclerosis  . Hypertension Brother   . Hyperlipidemia Brother   . Stroke Unknown        grandparent  . Hypertension Unknown        grandparent  . Osteoporosis Mother   . Depression Mother        dealing with some memory issues  . Glaucoma Mother   . Diabetes Brother        ? PE as well- she will verify  . Hyperlipidemia Brother     Medications- reviewed and updated Current Outpatient Medications  Medication Sig Dispense Refill  . acyclovir ointment (ZOVIRAX) 5 % Apply 1 application topically every 3 (three) hours. 30 g 1  . Cholecalciferol (D3 HIGH POTENCY) 2000 units CAPS Take 1 capsule by mouth daily.    Marland Kitchen glucose blood test strip Use to  test blood sugar daily 100 each 3  . Lancets (ACCU-CHEK SOFT TOUCH) lancets Use to test blood sugar daily 100 each 3  . Omega-3 Fatty Acids (OMEGA-3 CF PO) Take 250 mg by mouth daily.    . vitamin B-12 (CYANOCOBALAMIN) 250 MCG tablet Take 250 mcg by mouth daily.     No current facility-administered medications for this visit.     Allergies-reviewed and updated Allergies  Allergen Reactions  . Sulfa Antibiotics   . Penicillins Rash    Social History   Social History Narrative   Married. (husband [name] of Dr. Yong Channel). 2 children grown and married. Will be grandmother December 2016.    Has had 12 moves in 32 years, most recently [place] for 1.5 years, [place] 5 years prior, [place] prior      B.A. In nutrition. Masters in [employer]. In between jobs as Licensed conveyancer.        Objective: BP 98/70 (BP Location: Left Arm, Patient Position: Sitting, Cuff Size: Large)   Pulse (!) 57   Temp 98.2 F (36.8 C) (Oral)   Ht 5' (1.524 m)   Wt 112 lb 12.8 oz (51.2 kg)   SpO2 96%   BMI 22.03 kg/m  Gen: NAD, resting  comfortably HEENT: Mucous membranes are moist. Oropharynx normal Neck: no thyromegaly CV: RRR no murmurs rubs or gallops Lungs: CTAB no crackles, wheeze, rhonchi Abdomen: soft/nontender/nondistended/normal bowel sounds. No rebound or guarding.  Ext: no edema Skin: warm, dry Neuro: grossly normal, moves all extremities, PERRLA  Diabetic Foot Exam - Simple   Simple Foot Form Diabetic Foot exam was performed with the following findings:  Yes 08/11/2018  8:56 AM  Visual Inspection No deformities, no ulcerations, no other skin breakdown bilaterally:  Yes Sensation Testing Intact to touch and monofilament testing bilaterally:  Yes Pulse Check Posterior Tibialis and Dorsalis pulse intact bilaterally:  Yes Comments    Assessment/Plan:  57 y.o. female presenting for annual physical.  Health Maintenance counseling: 1. Anticipatory guidance: Patient counseled  regarding regular dental exams -q6 months, eye exams - yearly,  avoiding smoking and second hand smoke , limiting alcohol to 1 beverage per day .   2. Risk factor reduction:  Advised patient of need for regular exercise and diet rich and fruits and vegetables to reduce risk of heart attack and stroke. Exercise- almost every day. Diet-last visit patient's weight was down 63 pounds from her peak at 89- has lost 7 more lbs- not trying to lose any more weight- just trying to eat better and exercise.  Wt Readings from Last 3 Encounters:  08/11/18 112 lb 12.8 oz (51.2 kg)  03/09/18 119 lb 3.2 oz (54.1 kg)  12/03/17 132 lb 3.2 oz (60 kg)  3. Immunizations/screenings/ancillary studies-flu shot and final Shingrix today  Immunization History  Administered Date(s) Administered  . Influenza,inj,Quad PF,6+ Mos 07/31/2015, 08/04/2016, 08/05/2017  . Pneumococcal Polysaccharide-23 03/09/2018  . Tdap 07/31/2015  . Zoster Recombinat (Shingrix) 03/09/2018   Health Maintenance Due  Topic Date Due  . OPHTHALMOLOGY EXAM  05/02/1970  . MAMMOGRAM  08/20/2017  . INFLUENZA VACCINE  04/08/2018   4. Cervical cancer screening- repeat Pap smear 2018 reassuring-3-year repeat.  5. Breast cancer screening-  breast exam today - declined- and mammogram -last on file from 2016- she agrees again this year to get this done 6. Colon cancer screening - 10/2010 with 10 year repeat planned 7. Skin cancer screening-sees Dr. Wilhemina Bonito- sees yearly. advised regular sunscreen use. Denies worrisome, changing, or new skin lesions.   8. Birth control/STD check- postmenopausal/monogomous 9. Osteoporosis screening at 79- we will plan on this 10.  Never smoker  Status of chronic or acute concerns   Diabetes has been very well controlled without medication- she continues her daily exercise, strong avoidance of high glycemic foods and using plant-based diet as well as portion control -Need copy of most recent diabetic eye exam - Foot exam  updated today  Lab Results  Component Value Date   HGBA1C 6.0 08/04/2018    Hyperlipidemia- patient is diabetic and we discussed role of statin and diabetes even if just once a week-her numbers honestly have looked excellent with LDL under 70-she declined statin Lab Results  Component Value Date   CHOL 149 08/04/2018   HDL 71.70 08/04/2018   LDLCALC 69 08/04/2018   TRIG 38.0 08/04/2018   CHOLHDL 2 08/04/2018   Zovirax-PRN for fever blisters. Generic hasnt been as helpful- will send in DAW next year  Leukopenia on last labs- offered t CBC with differential today to make sure not trending down.  We will also get repeat BMP given low sodium-presume overhydration related--she would like to defer both of these until 23-month visit  Possible uterine prolapse Practicing Kegel's but feeling tissue  dropping down - when pushes back up no urinary urge. Wonders if its her uterus- wants gynecology referral. Worse since amazing weight loss  Depression remains in full remission without medication-has stressors including mom who is not doing as well with her memory and father who is drinking alcohol heavily  Return in about 6 months (around 02/10/2019) for follow up- or sooner if needed.  Lab/Order associations:Already had labs for physical- problem-oriented labs ordered Preventative health care  Uterine prolapse - Plan: Ambulatory referral to Gynecology  Controlled type 2 diabetes mellitus without complication, without long-term current use of insulin ([place]) - Plan: Hemoglobin B3A, Basic metabolic panel, CBC with Differential/Platelet  Return precautions advised.  Garret Reddish, MD

## 2018-08-11 NOTE — Patient Instructions (Addendum)
Health Maintenance Due  Topic Date Due  . FOOT EXAM -completed today 05/02/1970  . OPHTHALMOLOGY EXAM -called to get report faxed 05/02/1970  . MAMMOGRAM - please get the mammogram done 08/20/2017  . INFLUENZA VACCINE -completed today 04/08/2018   We will call you within two weeks about your referral to gynecology. If you do not hear within 3 weeks, give [place] a call.   Schedule labs a few days before your 6 month visit

## 2018-08-11 NOTE — Addendum Note (Signed)
Addended by: Lyndle Herrlich on: 08/11/2018 09:31 AM   Modules accepted: Orders

## 2018-08-19 ENCOUNTER — Other Ambulatory Visit: Payer: Self-pay

## 2018-08-19 ENCOUNTER — Encounter: Payer: Self-pay | Admitting: Obstetrics and Gynecology

## 2018-08-19 ENCOUNTER — Other Ambulatory Visit (HOSPITAL_COMMUNITY)
Admission: RE | Admit: 2018-08-19 | Discharge: 2018-08-19 | Disposition: A | Discharge status: Home / Self Care | Payer: Managed Care, Other (non HMO) | Source: Ambulatory Visit | Attending: Obstetrics and Gynecology | Admitting: Obstetrics and Gynecology

## 2018-08-19 ENCOUNTER — Ambulatory Visit (INDEPENDENT_AMBULATORY_CARE_PROVIDER_SITE_OTHER): Payer: Managed Care, Other (non HMO) | Admitting: Obstetrics and Gynecology

## 2018-08-19 VITALS — BP 104/70 | HR 72 | Resp 14 | Ht 60.0 in | Wt 112.0 lb

## 2018-08-19 DIAGNOSIS — Z01419 Encounter for gynecological examination (general) (routine) without abnormal findings: Secondary | ICD-10-CM | POA: Diagnosis present

## 2018-08-19 DIAGNOSIS — N812 Incomplete uterovaginal prolapse: Secondary | ICD-10-CM

## 2018-08-19 DIAGNOSIS — N816 Rectocele: Secondary | ICD-10-CM | POA: Insufficient documentation

## 2018-08-19 NOTE — Patient Instructions (Signed)

## 2018-08-19 NOTE — Progress Notes (Signed)
57 y.o. G2P2 Married Caucasian female here for new patient exam.    Lost 70 pounds in the last 2 years to try to prevent diabetes. She is controlling through diet and exercise.   Feels something coming out of the vagina for about one year, and she pushes it back up.  Does Kegel's. Some urgency, starting today.  States she can hold her urine.  DF - Voids every 3 - 4 hours.  Up twice a night to void.  No leak with cough, sneeze, or laugh.  States she does not feel she empties well. Denies constipation.  Eats a lot of fiber.  No fecal soiling.   2 vaginal deliveries.  No operative delivery.  Largest - 9 pounds, 4 ounces. Had GDM.   Patient is vegan.  She is trained as a Automotive engineer.   Cares for grandchildren and has twin grandchildren on the way.   Referred by her PCP.   PCP:   Garret Reddish, MD   No LMP recorded. Patient is postmenopausal.           Sexually active: Yes.    The current method of family planning is post menopausal status.    Exercising: Yes.    walking, stairs, spin, weight resistance, stretching Smoker:  no  Health Maintenance: Pap:  08-05-17 negative           11-22016 negative  History of abnormal Pap:  no MMG:  08-11-18 density B/BIRADS 1 negative  Colonoscopy:  10-10-10 normal, f/u 10 years  BMD:   n/a  Result  n/a TDaP:  07-31-15  Gardasil:   n/a HIV: donated blood in the past Hep C: donated blood in the past  Screening Labs:  Hb today: PCP, Urine today: unable to void at this time Flu vaccine:  Done.  Shingrix:  Completed.    reports that she has never smoked. She has never used smokeless tobacco. She reports current alcohol use of about 10.0 standard drinks of alcohol per week. She reports that she does not use drugs.  Past Medical History:  Diagnosis Date  . Chicken pox   . Depression    Resolved. in 16s- meds and counseling.  . Family history of hemochromatosis    father- screen iron  . Gestational diabetes 1987   a1c q3 years  .  Hammer toe   . Morton's neuroma    per records    Past Surgical History:  Procedure Laterality Date  . ENDOMETRIAL ABLATION  2010   heavy menstrual bleeding  . TONSILLECTOMY AND ADENOIDECTOMY  1965    Current Outpatient Medications  Medication Sig Dispense Refill  . acyclovir ointment (ZOVIRAX) 5 % Apply 1 application topically every 3 (three) hours. 30 g 1  . Cholecalciferol (D3 HIGH POTENCY) 2000 units CAPS Take 1 capsule by mouth daily.    Marland Kitchen glucose blood test strip Use to test blood sugar daily 100 each 3  . Lancets (ACCU-CHEK SOFT TOUCH) lancets Use to test blood sugar daily 100 each 3  . Omega-3 Fatty Acids (OMEGA-3 CF PO) Take 250 mg by mouth daily.    . vitamin B-12 (CYANOCOBALAMIN) 250 MCG tablet Take 250 mcg by mouth daily.     No current facility-administered medications for this visit.     Family History  Problem Relation Age of Onset  . Hemochromatosis Father   . Alcoholism Father   . Hearing loss Brother        otosclerosis  . Hypertension Brother   . Hyperlipidemia  Brother   . Stroke Other        grandparent  . Hypertension Other        grandparent  . Osteoporosis Mother   . Depression Mother        dealing with some memory issues  . Glaucoma Mother   . Diabetes Brother        ? PE as well- she will verify  . Hyperlipidemia Brother     Review of Systems  Constitutional: Negative.   HENT: Negative.   Eyes: Negative.   Respiratory: Negative.   Cardiovascular: Negative.   Gastrointestinal: Negative.   Endocrine: Negative.   Genitourinary:       Prolapse  Musculoskeletal: Negative.   Skin: Negative.   Allergic/Immunologic: Negative.   Neurological: Negative.   Hematological: Negative.   Psychiatric/Behavioral: Negative.     Exam:   BP 104/70 (BP Location: Right Arm, Patient Position: Sitting, Cuff Size: Normal)   Pulse 72   Resp 14   Ht 5' (1.524 m)   Wt 112 lb (50.8 kg)   BMI 21.87 kg/m     General appearance: alert, cooperative and  appears stated age Head: Normocephalic, without obvious abnormality, atraumatic Neck: no adenopathy, supple, symmetrical, trachea midline and thyroid normal to inspection and palpation Lungs: clear to auscultation bilaterally Breasts: normal appearance, no masses or tenderness, No nipple retraction or dimpling, No nipple discharge or bleeding, No axillary or supraclavicular adenopathy Heart: regular rate and rhythm Abdomen: soft, non-tender; no masses, no organomegaly Extremities: extremities normal, atraumatic, no cyanosis or edema Skin: Skin color, texture, turgor normal. No rashes or lesions Lymph nodes: Cervical, supraclavicular, and axillary nodes normal. No abnormal inguinal nodes palpated Neurologic: Grossly normal  Pelvic: External genitalia:  no lesions              Urethra:  normal appearing urethra with no masses, tenderness or lesions              Bartholins and Skenes: normal                 Vagina: normal appearing vagina with normal color and discharge, no lesions.  Third degree cystocele, second degree uterine prolapse, and almost first degree rectocele.  Patient examined in lithotomy and standing positions.              Cervix: no lesions              Pap taken: Yes.   Bimanual Exam:  Uterus:  normal size, contour, position, consistency, mobility, non-tender              Adnexa: no mass, fullness, tenderness              Rectal exam: Yes.  .  Confirms.              Anus:  normal sphincter tone, no lesions  Chaperone was present for exam.  Assessment:   Well woman visit with normal exam. Hx endometrial ablation 2010. Pelvic organ prolapse.  Incomplete uterovaginal prolapse. Hx anemia.   Plan: Mammogram screening. Recommended self breast awareness. Pap and HR HPV as above. Guidelines for Calcium, Vitamin D, regular exercise program including cardiovascular and weight bearing exercise.   Follow up annually and prn.   Additional counseling given.  Yes.   . _30______ minutes face to face time of which over 50% was spent in counseling.  I have had a comprehensive discussion with the patient regarding pelvic organ prolapse including risk factors, treatment option  and recurrence rates following surgical treatment.   I have provided reading materials from ACOG regarding prolapse and incontinence in general as well as medical and surgical treatment for these conditions.   We talked about observational management also.  Medical treatments may include physical therapy, pessary use.  We discussed surgical care options for hysterectomy combined with an anterior and posterior colporrhaphy, TVT Exact midurethral sling and cystoscopy.  I have discussed surgical expectations regarding the procedures and success rates, outcomes, and recovery.     I would proceed with urodynamic testing with reduction of the prolapse prior to doing surgery.   She wants to consider her options at this time and may return for further discussion.   After visit summary provided.

## 2018-08-23 ENCOUNTER — Telehealth: Payer: Self-pay | Admitting: Obstetrics and Gynecology

## 2018-08-23 NOTE — Telephone Encounter (Signed)
Patient says after thinking over treatment options, she would like to proceed with a pessary fitting.

## 2018-08-23 NOTE — Telephone Encounter (Signed)
Pessary fitting scheduled with Dr. Quincy Simmonds.  Patient agreeable.  Encounter closed.

## 2018-08-24 LAB — CYTOLOGY - PAP
Diagnosis: NEGATIVE
HPV: NOT DETECTED

## 2018-08-26 NOTE — Progress Notes (Signed)
GYNECOLOGY  VISIT   HPI: 57 y.o.   Married  Caucasian  female   G2P0 with Patient's last menstrual period was 09/09/2007 (approximate).   here for pessary fitting.     Third degree cystocele, second degree uterine prolapse, and almost first degree rectocele.   GYNECOLOGIC HISTORY: Patient's last menstrual period was 09/09/2007 (approximate). Contraception: Postmenopausal Menopausal hormone therapy: none Last mammogram: 08-11-18 density B/BIRADS 1 negative  Last pap smear: 08-19-18 Neg:Neg HR HPV                             08-06-27 Neg         OB History    Gravida  2   Para      Term      Preterm      AB      Living  2     SAB      TAB      Ectopic      Multiple      Live Births                 Patient Active Problem List   Diagnosis Date Noted  . Diabetes mellitus type II, controlled ([place]) 12/03/2017  . Fever blister 08/04/2016  . Hyperlipidemia 08/04/2016  . Depression   . History of gestational diabetes   . Family history of hemochromatosis     Past Medical History:  Diagnosis Date  . Chicken pox   . Depression    Resolved. in 76s- meds and counseling.  . Family history of hemochromatosis    father- screen iron  . Gestational diabetes 1987   a1c q3 years  . Hammer toe   . Morton's neuroma    per records    Past Surgical History:  Procedure Laterality Date  . ENDOMETRIAL ABLATION  2010   heavy menstrual bleeding  . TONSILLECTOMY AND ADENOIDECTOMY  1965    Current Outpatient Medications  Medication Sig Dispense Refill  . acyclovir ointment (ZOVIRAX) 5 % Apply 1 application topically every 3 (three) hours. 30 g 1  . Cholecalciferol (D3 HIGH POTENCY) 2000 units CAPS Take 1 capsule by mouth daily.    Marland Kitchen glucose blood test strip Use to test blood sugar daily 100 each 3  . Lancets (ACCU-CHEK SOFT TOUCH) lancets Use to test blood sugar daily 100 each 3  . Omega-3 Fatty Acids (OMEGA-3 CF PO) Take 250 mg by mouth daily.    . vitamin B-12  (CYANOCOBALAMIN) 250 MCG tablet Take 250 mcg by mouth daily.     No current facility-administered medications for this visit.      ALLERGIES: Sulfa antibiotics and Penicillins  Family History  Problem Relation Age of Onset  . Hemochromatosis Father   . Alcoholism Father   . Hearing loss Brother        otosclerosis  . Hypertension Brother   . Hyperlipidemia Brother   . Stroke Other        grandparent  . Hypertension Other        grandparent  . Osteoporosis Mother   . Depression Mother        dealing with some memory issues  . Glaucoma Mother   . Diabetes Brother        ? PE as well- she will verify  . Hyperlipidemia Brother     Social History   Socioeconomic History  . Marital status: Married    Spouse name: Not  on file  . Number of children: Not on file  . Years of education: Not on file  . Highest education level: Not on file  Occupational History  . Not on file  Social Needs  . Financial resource strain: Not on file  . Food insecurity:    Worry: Not on file    Inability: Not on file  . Transportation needs:    Medical: Not on file    Non-medical: Not on file  Tobacco Use  . Smoking status: Never Smoker  . Smokeless tobacco: Never Used  Substance and Sexual Activity  . Alcohol use: Yes    Alcohol/week: 10.0 standard drinks    Types: 10 Standard drinks or equivalent per week  . Drug use: No  . Sexual activity: Yes  Lifestyle  . Physical activity:    Days per week: Not on file    Minutes per session: Not on file  . Stress: Not on file  Relationships  . Social connections:    Talks on phone: Not on file    Gets together: Not on file    Attends religious service: Not on file    Active member of club or organization: Not on file    Attends meetings of clubs or organizations: Not on file    Relationship status: Not on file  . Intimate partner violence:    Fear of current or ex partner: Not on file    Emotionally abused: Not on file    Physically  abused: Not on file    Forced sexual activity: Not on file  Other Topics Concern  . Not on file  Social History Narrative   Married. (husband [name] of Dr. Yong Channel). 2 children grown and married. Will be grandmother December 2016.    Has had 12 moves in 32 years, most recently [place] for 1.5 years, [place] 5 years prior, [place] prior      B.A. In nutrition. Masters in [employer]. In between jobs as Licensed conveyancer.        Review of Systems  All other systems reviewed and are negative.   PHYSICAL EXAMINATION:    BP 118/64 (BP Location: Right Arm, Patient Position: Sitting, Cuff Size: Normal)   Pulse 60   Ht 5' (1.524 m)   Wt 112 lb (50.8 kg)   LMP 09/09/2007 (Approximate) Comment: this was with her endometrial ablation  BMI 21.87 kg/m     General appearance: alert, cooperative and appears stated age    Pelvic: External genitalia:  no lesions              Urethra:  normal appearing urethra with no masses, tenderness or lesions              Bartholin's glands:  Normal.                Bimanual Exam:  Uterus:  normal size, contour, position, consistency, mobility, non-tender              Adnexa: no mass, fullness, tenderness   Pessary ring with support - #4 - lot number [id number], exp -  03/08/2027 to patient.  Able to do maneuvers and maintain pessary.  Pessary is comfortable. She is able to place and remove pessary.   #5 ring with support not as comfortable for the patient.   Chaperone was present for exam.  ASSESSMENT  Incomplete uterovaginal prolapse.   PLAN  Pessary fitting and care reviewed with the patient.  FU  1 - 2 weeks  for pessary check.    An After Visit Summary was printed and given to the patient.  _15_____ minutes face to face time of which over 50% was spent in counseling.

## 2018-08-27 ENCOUNTER — Ambulatory Visit (INDEPENDENT_AMBULATORY_CARE_PROVIDER_SITE_OTHER): Payer: Managed Care, Other (non HMO) | Admitting: Obstetrics and Gynecology

## 2018-08-27 ENCOUNTER — Encounter: Payer: Self-pay | Admitting: Obstetrics and Gynecology

## 2018-08-27 ENCOUNTER — Other Ambulatory Visit: Payer: Self-pay

## 2018-08-27 VITALS — BP 118/64 | HR 60 | Ht 60.0 in | Wt 112.0 lb

## 2018-08-27 DIAGNOSIS — N812 Incomplete uterovaginal prolapse: Secondary | ICD-10-CM | POA: Diagnosis not present

## 2018-08-27 NOTE — Patient Instructions (Signed)

## 2018-09-06 ENCOUNTER — Other Ambulatory Visit: Payer: Self-pay

## 2018-09-06 ENCOUNTER — Ambulatory Visit (INDEPENDENT_AMBULATORY_CARE_PROVIDER_SITE_OTHER): Payer: Managed Care, Other (non HMO) | Admitting: Obstetrics and Gynecology

## 2018-09-06 ENCOUNTER — Encounter: Payer: Self-pay | Admitting: Obstetrics and Gynecology

## 2018-09-06 VITALS — BP 100/62 | HR 56 | Ht 60.0 in | Wt 112.4 lb

## 2018-09-06 DIAGNOSIS — Z4689 Encounter for fitting and adjustment of other specified devices: Secondary | ICD-10-CM | POA: Diagnosis not present

## 2018-09-06 DIAGNOSIS — N812 Incomplete uterovaginal prolapse: Secondary | ICD-10-CM | POA: Diagnosis not present

## 2018-09-06 NOTE — Progress Notes (Signed)
GYNECOLOGY  VISIT   HPI: 57 y.o.   Married  Caucasian  female   G2P0 with Patient's last menstrual period was 09/09/2007 (approximate).   here for pessary check.   Patient states doing well with pessary but has noticed some milky white discharge lately. She is having hot flashes/night sweats. Denies vaginal itching, burning or odor.   Denies leakage of urine.   Feels like the pessary can almost fall out with BMs.   Able to place and remove it.   Had a litlte bit of bleeding today.   Using KY jelly with use of pessary.   GYNECOLOGIC HISTORY: Patient's last menstrual period was 09/09/2007 (approximate). Contraception:  Postmenopausal Menopausal hormone therapy:  none Last mammogram:  08-11-18 density B/BIRADS 1 negative Last pap smear:  08-19-18 Neg:Neg HR HPV                             08-06-27 Neg         OB History    Gravida  2   Para      Term      Preterm      AB      Living  2     SAB      TAB      Ectopic      Multiple      Live Births                 Patient Active Problem List   Diagnosis Date Noted  . Diabetes mellitus type II, controlled ([place]) 12/03/2017  . Fever blister 08/04/2016  . Hyperlipidemia 08/04/2016  . Depression   . History of gestational diabetes   . Family history of hemochromatosis     Past Medical History:  Diagnosis Date  . Chicken pox   . Depression    Resolved. in 55s- meds and counseling.  . Family history of hemochromatosis    father- screen iron  . Gestational diabetes 1987   a1c q3 years  . Hammer toe   . Morton's neuroma    per records    Past Surgical History:  Procedure Laterality Date  . ENDOMETRIAL ABLATION  2010   heavy menstrual bleeding  . TONSILLECTOMY AND ADENOIDECTOMY  1965    Current Outpatient Medications  Medication Sig Dispense Refill  . acyclovir ointment (ZOVIRAX) 5 % Apply 1 application topically every 3 (three) hours. 30 g 1  . Cholecalciferol (D3 HIGH POTENCY) 2000 units  CAPS Take 1 capsule by mouth daily.    Marland Kitchen glucose blood test strip Use to test blood sugar daily 100 each 3  . Lancets (ACCU-CHEK SOFT TOUCH) lancets Use to test blood sugar daily 100 each 3  . Omega-3 Fatty Acids (OMEGA-3 CF PO) Take 250 mg by mouth daily.    . vitamin B-12 (CYANOCOBALAMIN) 250 MCG tablet Take 250 mcg by mouth daily.     No current facility-administered medications for this visit.      ALLERGIES: Sulfa antibiotics and Penicillins  Family History  Problem Relation Age of Onset  . Hemochromatosis Father   . Alcoholism Father   . Hearing loss Brother        otosclerosis  . Hypertension Brother   . Hyperlipidemia Brother   . Stroke Other        grandparent  . Hypertension Other        grandparent  . Osteoporosis Mother   . Depression Mother  dealing with some memory issues  . Glaucoma Mother   . Diabetes Brother        ? PE as well- she will verify  . Hyperlipidemia Brother     Social History   Socioeconomic History  . Marital status: Married    Spouse name: Not on file  . Number of children: Not on file  . Years of education: Not on file  . Highest education level: Not on file  Occupational History  . Not on file  Social Needs  . Financial resource strain: Not on file  . Food insecurity:    Worry: Not on file    Inability: Not on file  . Transportation needs:    Medical: Not on file    Non-medical: Not on file  Tobacco Use  . Smoking status: Never Smoker  . Smokeless tobacco: Never Used  Substance and Sexual Activity  . Alcohol use: Yes    Alcohol/week: 10.0 standard drinks    Types: 10 Standard drinks or equivalent per week  . Drug use: No  . Sexual activity: Yes  Lifestyle  . Physical activity:    Days per week: Not on file    Minutes per session: Not on file  . Stress: Not on file  Relationships  . Social connections:    Talks on phone: Not on file    Gets together: Not on file    Attends religious service: Not on file     Active member of club or organization: Not on file    Attends meetings of clubs or organizations: Not on file    Relationship status: Not on file  . Intimate partner violence:    Fear of current or ex partner: Not on file    Emotionally abused: Not on file    Physically abused: Not on file    Forced sexual activity: Not on file  Other Topics Concern  . Not on file  Social History Narrative   Married. (husband [name] of Dr. Yong Channel). 2 children grown and married. Will be grandmother December 2016.    Has had 12 moves in 32 years, most recently [place] for 1.5 years, [place] 5 years prior, [place] prior      B.A. In nutrition. Masters in [employer]. In between jobs as Licensed conveyancer.        Review of Systems  All other systems reviewed and are negative.   PHYSICAL EXAMINATION:    BP 100/62 (BP Location: Right Arm, Patient Position: Sitting, Cuff Size: Normal)   Pulse (!) 56   Ht 5' (1.524 m)   Wt 112 lb 6.4 oz (51 kg)   LMP 09/09/2007 (Approximate) Comment: this was with her endometrial ablation  BMI 21.95 kg/m     General appearance: alert, cooperative and appears stated age Head: Normocephalic, without obvious abnormality, atraumatic N  Pelvic: External genitalia:  no lesions              Urethra:  normal appearing urethra with no masses, tenderness or lesions              Bartholins and Skenes: normal                 Vagina: normal appearing vagina with normal color and discharge, no lesions              Cervix: no lesions                Bimanual Exam:  Uterus:  normal size, contour,  position, consistency, mobility, non-tender              Adnexa: no mass, fullness, tenderness     Pessary removed, cleansed and replaced.    Chaperone was present for exam.  ASSESSMENT  Incomplete uterovaginal prolapse.  Pessary maintenance.  Doing well.  PLAN  Continue pessary usage. No vaginitis tx or vaginal estrogen needed.  Fu for annual exam and prn.    An  After Visit Summary was printed and given to the patient.  __15___ minutes face to face time of which over 50% was spent in counseling.

## 2018-11-08 ENCOUNTER — Ambulatory Visit (INDEPENDENT_AMBULATORY_CARE_PROVIDER_SITE_OTHER): Payer: Managed Care, Other (non HMO) | Admitting: Family Medicine

## 2018-11-08 ENCOUNTER — Encounter: Payer: Self-pay | Admitting: Family Medicine

## 2018-11-08 VITALS — BP 100/60 | HR 47 | Temp 97.9°F | Ht 60.0 in | Wt 105.6 lb

## 2018-11-08 DIAGNOSIS — E785 Hyperlipidemia, unspecified: Secondary | ICD-10-CM

## 2018-11-08 DIAGNOSIS — R739 Hyperglycemia, unspecified: Secondary | ICD-10-CM

## 2018-11-08 DIAGNOSIS — D72819 Decreased white blood cell count, unspecified: Secondary | ICD-10-CM

## 2018-11-08 DIAGNOSIS — E119 Type 2 diabetes mellitus without complications: Secondary | ICD-10-CM | POA: Diagnosis not present

## 2018-11-08 DIAGNOSIS — R634 Abnormal weight loss: Secondary | ICD-10-CM

## 2018-11-08 LAB — CBC WITH DIFFERENTIAL/PLATELET
Basophils Absolute: 0 10*3/uL (ref 0.0–0.1)
Basophils Relative: 0.9 % (ref 0.0–3.0)
EOS PCT: 0.5 % (ref 0.0–5.0)
Eosinophils Absolute: 0 10*3/uL (ref 0.0–0.7)
HCT: 40.4 % (ref 36.0–46.0)
Hemoglobin: 13.8 g/dL (ref 12.0–15.0)
Lymphocytes Relative: 35.2 % (ref 12.0–46.0)
Lymphs Abs: 0.8 10*3/uL (ref 0.7–4.0)
MCHC: 34.2 g/dL (ref 30.0–36.0)
MCV: 90.6 fl (ref 78.0–100.0)
MONO ABS: 0.2 10*3/uL (ref 0.1–1.0)
Monocytes Relative: 8.4 % (ref 3.0–12.0)
Neutro Abs: 1.3 10*3/uL — ABNORMAL LOW (ref 1.4–7.7)
Neutrophils Relative %: 55 % (ref 43.0–77.0)
Platelets: 153 10*3/uL (ref 150.0–400.0)
RBC: 4.46 Mil/uL (ref 3.87–5.11)
RDW: 13.2 % (ref 11.5–15.5)
WBC: 2.3 10*3/uL — ABNORMAL LOW (ref 4.0–10.5)

## 2018-11-08 LAB — BASIC METABOLIC PANEL
BUN: 6 mg/dL (ref 6–23)
CALCIUM: 9.6 mg/dL (ref 8.4–10.5)
CO2: 30 mEq/L (ref 19–32)
Chloride: 98 mEq/L (ref 96–112)
Creatinine, Ser: 0.56 mg/dL (ref 0.40–1.20)
GFR: 110.99 mL/min (ref >60.00)
Glucose, Bld: 130 mg/dL — ABNORMAL HIGH (ref 70–99)
Potassium: 5.7 mEq/L — ABNORMAL HIGH (ref 3.5–5.1)
Sodium: 133 mEq/L — ABNORMAL LOW (ref 135–145)

## 2018-11-08 LAB — TSH: TSH: 1.07 u[IU]/mL (ref 0.35–4.50)

## 2018-11-08 LAB — HEMOGLOBIN A1C: Hgb A1c MFr Bld: 6.6 % — ABNORMAL HIGH (ref 4.6–6.5)

## 2018-11-08 NOTE — Progress Notes (Signed)
Phone [phone]   Subjective:  Brittany Barton is a 58 y.o. year old very pleasant female patient who presents for/with See problem oriented charting ROS-   No chest pain or shortness of breath. No headache or blurry vision. Has had some tinnitus- worsened last week then improved. No hearing loss.    Past Medical History-  Patient Active Problem List   Diagnosis Date Noted  . Diabetes mellitus type II, controlled ([place]) 12/03/2017    Priority: High  . Hyperlipidemia 08/04/2016    Priority: Medium  . History of gestational diabetes     Priority: Medium  . Fever blister 08/04/2016    Priority: Low  . Depression     Priority: Low  . Family history of hemochromatosis     Priority: Low    Medications- reviewed and updated Current Outpatient Medications  Medication Sig Dispense Refill  . acyclovir ointment (ZOVIRAX) 5 % Apply 1 application topically every 3 (three) hours. 30 g 1  . Cholecalciferol (D3 HIGH POTENCY) 2000 units CAPS Take 1 capsule by mouth daily.    Marland Kitchen glucose blood test strip Use to test blood sugar daily 100 each 3  . Lancets (ACCU-CHEK SOFT TOUCH) lancets Use to test blood sugar daily 100 each 3  . vitamin B-12 (CYANOCOBALAMIN) 250 MCG tablet Take 250 mcg by mouth daily.       Objective:  Wt 105 lb 9.6 oz (47.9 kg)   LMP 09/09/2007 (Approximate) Comment: this was with her endometrial ablation  BMI 20.62 kg/m  Gen: NAD, resting comfortably CV: RRR no murmurs rubs or gallops Lungs: CTAB no crackles, wheeze, rhonchi Abdomen: soft/nontender Ext: no edema Skin: warm, dry Neuro: Gait and speech normal Psych: Some anxiety about diabetes status and tests      Assessment and Plan   #Diabetes S: Has been well controlled without medication.  Regular exercise has been a key for her.  She avoids high glycemic foods and uses a plant-based diet as well.  She is concerned about type 1.5 diabetes. Over last 2 weeks- eating 75% of her food from carbs, <15% fat,  15% protein. Fasting glucose has been over 130 several times. Sometimes higher when gets up in AM than when she goes to bed at night.   She has had low level tinnitus but last Monday was louder (got better with prayer). raynauds seems to have returned.   Has not some slowed wound healing. Has noted some periodontal disease A/P: suspect good control - she is reading a book "mastering diabetes" that she has found helpful but they request further blood work information. -Patient requests work-up for potential autoimmune cause of diabetes/diabetes 1.5-have ordered below -Patient with some anxiety about doing these labs due to potential cost-I told her I was not aware of the full cost of these labs -She knows needs C-peptide but the other labs were referred to his diabetes antibody test- I essentially ordered what I would typically use for type 1 diabetes work-up in children -Still need to get a copy of last diabetic eye exam  #Hyperlipidemia associated with diabetes S: We have previously discussed the role of statin in patients with diabetes even just once a week.  Her lipids have looked reasonable with LDL under 70 but we discussed there is still a role.   A/P: Ideally would be on statin due to diabetes-she declined statin again today-wants to update lipid panel in November  Other notes: 1.  Will need updated PHQ 9 next visit- has been  just under 4 months with this check 2.  We will plan to check on her daughter who apparently is in the middle of a high risk twin pregnancy  No problem-specific Assessment & Plan notes found for this encounter.   Future Appointments  Date Time Provider [place]  02/07/2019  8:30 AM [facility]  02/10/2019  8:20 AM Marin Olp, MD [facility]  08/15/2019  1:20 PM Marin Olp, MD [facility]   Can cancel lab visit June 1-order labs at June 4 visit  Lab/Order associations: Controlled type 2 diabetes mellitus without  complication, without long-term current use of insulin ([place]) - Plan: C-peptide, Anti-islet cell antibody, Glutamic acid decarboxylase auto abs, Insulin antibodies, blood, Tissue transglutaminase, IgA, Endomysial IgA Antibody, Hemoglobin A1c  Hyperlipidemia, unspecified hyperlipidemia type  Hyperglycemia - Plan: C-peptide, Anti-islet cell antibody, Glutamic acid decarboxylase auto abs, Insulin antibodies, blood, Tissue transglutaminase, IgA, Endomysial IgA Antibody  Weight loss - Plan: TSH   Time Stamp The duration of face-to-face time during this visit was greater than 25 minutes. Greater than 50% of this time was spent in counseling, explanation of diagnosis, planning of further management, and/or coordination of care including discussing different diabetes antibody test, discussing patient concerned about cost, discussing role of statin.    Return precautions advised.  Garret Reddish, MD

## 2018-11-08 NOTE — Addendum Note (Signed)
Addended by: Francis Dowse T on: 11/08/2018 09:54 AM   Modules accepted: Orders

## 2018-11-08 NOTE — Addendum Note (Signed)
Addended by: Marin Olp on: 11/08/2018 09:48 AM   Modules accepted: Orders

## 2018-11-08 NOTE — Patient Instructions (Addendum)
Health Maintenance Due  Topic Date Due  . OPHTHALMOLOGY EXAM  Please sign a release of information at the front desk 05/02/1971   Please stop by lab before you go If you do not have mychart- we will call you about results within 5 business days of [place] receiving them.  If you have mychart- we will send your results within 3 business days of [place] receiving them.  If abnormal or we want to clarify a result, we will call or mychart you to make sure you receive the message.  If you have questions or concerns or don't hear within 5-7 days, please send [place] a message or call us.    Try to avoid further weight loss  Cancel your lab visit on 02/07/2019. Keep visit on 02/10/2019

## 2018-11-10 NOTE — Addendum Note (Signed)
Addended by: Gwenyth Ober R on: 11/10/2018 03:34 PM   Modules accepted: Orders

## 2018-11-11 ENCOUNTER — Telehealth: Payer: Self-pay | Admitting: Family Medicine

## 2018-11-11 NOTE — Telephone Encounter (Signed)
See note

## 2018-11-11 NOTE — Telephone Encounter (Signed)
Pt called to speak with Kendra/

## 2018-11-11 NOTE — Telephone Encounter (Signed)
Pt returning Kendra's call

## 2018-11-11 NOTE — Telephone Encounter (Signed)
Copied from [place] [phone]. Topic: General - Other >> Nov 11, 2018  8:30 AM Carolyn Stare wrote:  Pt return [place] call and said she will be in and out of meeting today and will try and answer the call

## 2018-11-11 NOTE — Telephone Encounter (Signed)
Pt returned call to [facility]. Pt asked if Brittany Barton could contacted her first thing in the morning (if possible). Pt also stated that she would like to have a consult with Endo.

## 2018-11-11 NOTE — Telephone Encounter (Signed)
Left message to return phone call.

## 2018-11-12 NOTE — Telephone Encounter (Signed)
See note

## 2018-11-12 NOTE — Addendum Note (Signed)
Addended by: Gwenyth Ober R on: 11/12/2018 09:23 AM   Modules accepted: Orders

## 2018-11-14 LAB — GLUTAMIC ACID DECARBOXYLASE AUTO ABS: Glutamic Acid Decarb Ab: >250 IU/mL — ABNORMAL HIGH (ref <5)

## 2018-11-14 LAB — C-PEPTIDE: C-Peptide: 0.55 ng/mL — ABNORMAL LOW (ref 0.80–3.85)

## 2018-11-14 LAB — TISSUE TRANSGLUTAMINASE, IGA: (tTG) Ab, IgA: 1 U/mL

## 2018-11-14 LAB — INSULIN ANTIBODIES, BLOOD: Insulin Antibodies, Human: 3.6 U/mL — ABNORMAL HIGH (ref <0.4)

## 2018-11-15 NOTE — Telephone Encounter (Signed)
This has already been taking care of in another encounter

## 2018-11-23 ENCOUNTER — Other Ambulatory Visit: Payer: Self-pay

## 2018-11-23 ENCOUNTER — Encounter: Payer: Self-pay | Admitting: Internal Medicine

## 2018-11-23 ENCOUNTER — Encounter: Payer: Self-pay | Admitting: Family Medicine

## 2018-11-23 ENCOUNTER — Ambulatory Visit (INDEPENDENT_AMBULATORY_CARE_PROVIDER_SITE_OTHER): Payer: Managed Care, Other (non HMO) | Admitting: Internal Medicine

## 2018-11-23 VITALS — BP 122/70 | HR 57 | Ht 60.0 in | Wt 107.0 lb

## 2018-11-23 DIAGNOSIS — E875 Hyperkalemia: Secondary | ICD-10-CM | POA: Diagnosis not present

## 2018-11-23 DIAGNOSIS — E139 Other specified diabetes mellitus without complications: Secondary | ICD-10-CM

## 2018-11-23 MED ORDER — FREESTYLE LIBRE 14 DAY READER DEVI: 1.0000 | Freq: Once | 1 refills | Status: AC

## 2018-11-23 MED ORDER — FREESTYLE LIBRE 14 DAY SENSOR MISC: 1.0000 | 11 refills | Status: DC

## 2018-11-23 NOTE — Progress Notes (Signed)
Patient ID: Brittany Barton, female   DOB: 1961/07/16, 58 y.o.   MRN: [id number]   HPI: Brittany Barton is a 58 y.o.-year-old female, referred by her PCP, Dr. 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  In 02/2015 >> she started a plant-based diet. She lost 80 lbs since then.  Her sugars initially improved, but her HbA1c has increased in the last 8 months despite a fairly strict diet.  She also lost several pounds.  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  Reviewed latest HbA1c levels: Lab Results  Component Value Date   HGBA1C 6.6 (H) 11/08/2018   HGBA1C 6.0 08/04/2018   HGBA1C 5.4 03/09/2018   HGBA1C 7.6 12/03/2017   HGBA1C 6.6 (H) 08/06/2017   HGBA1C 5.9 08/04/2016   HGBA1C 5.7 07/31/2015   HGBA1C 5.6 05/24/2013   Of note, she had a low C-peptide and high GAD Abs: Component     Latest Ref Rng & Units 11/08/2018  Glucose     70 - 99 mg/dL 130 (H)  C-Peptide     0.80 - 3.85 ng/mL 0.55 (L)  Glutamic Acid Decarb Ab     <5 IU/mL >250 (H)  Insulin Antibodies, Human     <0.4 U/mL 3.6 (H)   Interestingly, she also had a high potassium and a low sodium: Component     Latest Ref Rng & Units 11/08/2018  Sodium     135 - 145 mEq/L 133 (L)  Potassium     3.5 - 5.1 mEq/L 5.7 (H)   Pt is not on any medicines for her DM, but is on a plant-based diet, avoids high Glycemic Index foods and exercises consistently.  Pt checks her sugars 1-3 a day and they are: - am: 111-142 - 2h after b'fast: 87-133 - before lunch: 65, 82 - 2h after lunch: 155, 166 - before dinner: n/c - 2h after dinner: 111-140 - bedtime: n/c - nighttime: n/c Lowest sugar was 65; she has hypoglycemia awareness at 70.  Highest sugar was 166.  Glucometer: Accuchek  Pt's meals are: - Breakfast: Raw/soaked oatmeal, blueberries or other fruit, ground  flaxseed, walnuts (2 tablespoons), soy milk - Lunch: Beans, whole grains, leafy greens, other vegetables and fruit - Dinner: Salad with beans/peas or vegetable-based soup. - Snacks: 0-2-fruit, veggies, homemade bean dip  - no CKD, last BUN/creatinine:  Lab Results  Component Value Date   BUN 6 11/08/2018   BUN 8 08/04/2018   CREATININE 0.56 11/08/2018   CREATININE 0.50 08/04/2018  Not on an ACEI/ARB.  - No HL; last set of lipids: Lab Results  Component Value Date   CHOL 149 08/04/2018   HDL 71.70 08/04/2018   LDLCALC 69 08/04/2018   TRIG 38.0 08/04/2018   CHOLHDL 2 08/04/2018  Not on a statin.  - last eye exam was in summer 2019. No DR.   - no numbness and tingling in her feet.  Pt has FH of DM in [place].  Brother with otosclerosis.  ROS: Constitutional: no weight gain, + weight loss, no fatigue, no subjective hyperthermia, + subjective hypothermia, +4-5x nocturia Eyes: no blurry vision, no xerophthalmia ENT: no sore throat, no nodules palpated in neck, no dysphagia, no odynophagia, no hoarseness, + tinnitus, + hypoacusis Cardiovascular: no CP, no SOB, no palpitations, no leg swelling Respiratory: no cough, no SOB, no wheezing Gastrointestinal: no N, no V, no D,  no C, no acid reflux Musculoskeletal: no muscle, no joint aches Skin: no rash, no hair loss, + easy bruising Neurological: no tremors, no numbness or tingling/no dizziness/no HAs Psychiatric: + Depression, no anxiety + Low libido  Past Medical History:  Diagnosis Date  . Chicken pox   . Depression    Resolved. in 59s- meds and counseling.  . Family history of hemochromatosis    father- screen iron  . Gestational diabetes 1987   a1c q3 years  . Hammer toe   . Morton's neuroma    per records   Past Surgical History:  Procedure Laterality Date  . ENDOMETRIAL ABLATION  2010   heavy menstrual bleeding  . TONSILLECTOMY AND ADENOIDECTOMY  1965   Social History   Socioeconomic History  . Marital status:  Married    Spouse name: Not on file  . Number of children: 2  . Years of education: Not on file  . Highest education level: Not on file  Occupational History  .  Librarian  Social Needs  . Financial resource strain: Not on file  . Food insecurity:    Worry: Not on file    Inability: Not on file  . Transportation needs:    Medical: Not on file    Non-medical: Not on file  Tobacco Use  . Smoking status: Never Smoker  . Smokeless tobacco: Never Used  Substance and Sexual Activity  . Alcohol use: Yes    Alcohol/week:  2-4 drinks per week    Types:  Wine/beer  . Drug use: No  . Sexual activity: Yes  Lifestyle  . Physical activity: Walk/hike/spin class/weight resistance/stretching    Days per week: 7    Minutes per session: Not on file  Relationships  Social History Narrative   Married. (husband Brittany Barton of Dr. Yong Barton). 2 children grown and married. Will be grandmother December 2016.    Has had 12 moves in 32 years, most recently [place] for 1.5 years, [place] 5 years prior, [place] prior      B.A. In nutrition. Masters in [employer]. In between jobs as Licensed conveyancer.       Current Outpatient Medications on File Prior to Visit  Medication Sig Dispense Refill  . acyclovir ointment (ZOVIRAX) 5 % Apply 1 application topically every 3 (three) hours. 30 g 1  . Cholecalciferol (D3 HIGH POTENCY) 2000 units CAPS Take 1 capsule by mouth daily.    Marland Kitchen glucose blood test strip Use to test blood sugar daily 100 each 3  . Lancets (ACCU-CHEK SOFT TOUCH) lancets Use to test blood sugar daily 100 each 3  . vitamin B-12 (CYANOCOBALAMIN) 250 MCG tablet Take 250 mcg by mouth daily.     No current facility-administered medications on file prior to visit.    Allergies  Allergen Reactions  . Sulfa Antibiotics   . Penicillins Rash   Family History  Problem Relation Age of Onset  . Hemochromatosis Father   . Alcoholism Father   . Hearing loss Brother        otosclerosis  .  Hypertension Brother   . Hyperlipidemia Brother   . Stroke Other        grandparent  . Hypertension Other        grandparent  . Osteoporosis Mother   . Depression Mother        dealing with some memory issues  . Glaucoma Mother   . Diabetes Brother        ? PE as well- she will verify  .  Hyperlipidemia Brother      PE: BP 122/70   Pulse (!) 57   Ht 5' (1.524 m)   Wt 107 lb (48.5 kg)   LMP 09/09/2007 (Approximate) Comment: this was with her endometrial ablation  SpO2 99%   BMI 20.90 kg/m   Wt Readings from Last 3 Encounters:  11/23/18 107 lb (48.5 kg)  11/08/18 105 lb 9.6 oz (47.9 kg)  09/06/18 112 lb 6.4 oz (51 kg)   Constitutional: normal weight, in NAD Eyes: PERRLA, EOMI, no exophthalmos ENT: moist mucous membranes, no thyromegaly, no cervical lymphadenopathy Cardiovascular: RRR, No MRG Respiratory: CTA B Gastrointestinal: abdomen soft, NT, ND, BS+ Musculoskeletal: no deformities, strength intact in all 4 Skin: moist, warm, no rashes Neurological: no tremor with outstretched hands, DTR normal in all 4  ASSESSMENT: 1. LADA, non-insulin-dependent, controlled, without long-term complications but with occasional hyperglycemia  2. Hyperkalemia and hyponatremia  PLAN:  1. Patient with several years of controlled diabetes on a plant-based diet.  She also keeps a strict exercise routine, exercising every day.  I think she is doing a great job with her diet but due to the weight loss, we discussed about introducing some higher calorie foods into the diet.  We do not want to increase her insulin resistance, but I believe that not limiting too much foods like unsalted nuts or avocado would help her. -We also reviewed her recent labs checked by PCP.  She had anti-pancreatic antibodies (both GAD and insulin).  She also had a low peptide in the setting of a slightly high glucose.  I explained that her insulin production is likely low, but based on her sugars, we do not absolutely  need to start insulin for now.  I explained the dichotomy in the field between the support for early in insulin introduction to protect her beta cells and help with weight gain versus later insulin introduction when sugars start to increase.  For now, we decided together to hold off insulin at least until next visit.  If we do start insulin at that time, she will probably require few units of a long-acting insulin.  I explained what would prompt [place] to start insulin before next visit: Increased variability in her blood sugars, slowly increasing CBGs. -Otherwise, I would not suggest any medical treatment for now -I will have her back for the rest of the anti-pancreatic antibodies, but I explained that these would likely not change our management. - Strongly advised her to start checking sugars at different times of the day - check 1-2x a day, rotating checks - discussed about CBG targets for treatment: 80-130 mg/dL before meals and <180 mg/dL after meals; target HbA1c <7%. - given sugar log and advised how to fill it and to bring it at next appt  - given foot care handout and explained the principles  - given instructions for hypoglycemia management "15-15 rule"  - advised for yearly eye exams  - Return to clinic in 3 mo with sugar log   2. Hyperkalemia and hyponatremia -Reviewed together her latest labs from the beginning of the month.  She had a slightly high glucose, a low sodium and high potassium. -We discussed that she does have an autoimmune disease (LADA) and will need to check her for adrenal insufficiency especially in the setting of her weight loss.  She also noticed some delayed wound healing and darkening of her scars.  No abdominal pain, headaches. -We will have her back for a cosyntropin stimulation test  Patient  Instructions  Please come back for labs at 8-9 am, fasting. Plan to be here for 1 hour.  Please come back in 3 months.  Orders Placed This Encounter  Procedures  .  Anti-islet cell antibody  . ZNT8 Antibodies  . Cortisol  . ACTH  . Cortisol  . Cortisol   Philemon Kingdom, MD PhD [facility]

## 2018-11-23 NOTE — Patient Instructions (Signed)
Please come back for labs at 8-9 am, fasting. Plan to be here for 1 hour.  Please come back in 3 months.  PATIENT INSTRUCTIONS FOR TYPE 2 DIABETES:  **Please join MyChart!** - see attached instructions about how to join if you have not done so already.  DIET AND EXERCISE Diet and exercise is an important part of diabetic treatment.  We recommended aerobic exercise in the form of brisk walking (working between 40-60% of maximal aerobic capacity, similar to brisk walking) for 150 minutes per week (such as 30 minutes five days per week) along with 3 times per week performing 'resistance' training (using various gauge rubber tubes with handles) 5-10 exercises involving the major muscle groups (upper body, lower body and core) performing 10-15 repetitions (or near fatigue) each exercise. Start at half the above goal but build slowly to reach the above goals. If limited by weight, joint pain, or disability, we recommend daily walking in a swimming pool with water up to waist to reduce pressure from joints while allow for adequate exercise.    BLOOD GLUCOSES Monitoring your blood glucoses is important for continued management of your diabetes. Please check your blood glucoses 2-4 times a day: fasting, before meals and at bedtime (you can rotate these measurements - e.g. one day check before the 3 meals, the next day check before 2 of the meals and before bedtime, etc.).   HYPOGLYCEMIA (low blood sugar) Hypoglycemia is usually a reaction to not eating, exercising, or taking too much insulin/ other diabetes drugs.  Symptoms include tremors, sweating, hunger, confusion, headache, etc. Treat IMMEDIATELY with 15 grams of Carbs: . 4 glucose tablets .  cup regular juice/soda . 2 tablespoons raisins . 4 teaspoons sugar . 1 tablespoon honey Recheck blood glucose in 15 mins and repeat above if still symptomatic/blood glucose <100.  RECOMMENDATIONS TO REDUCE YOUR RISK OF DIABETIC COMPLICATIONS: * Take your  prescribed MEDICATION(S) * Follow a DIABETIC diet: Complex carbs, fiber rich foods, (monounsaturated and polyunsaturated) fats * AVOID saturated/trans fats, high fat foods, >2,300 mg salt per day. * EXERCISE at least 5 times a week for 30 minutes or preferably daily.  * DO NOT SMOKE OR DRINK more than 1 drink a day. * Check your FEET every day. Do not wear tightfitting shoes. Contact us if you develop an ulcer * See your EYE doctor once a year or more if needed * Get a FLU shot once a year * Get a PNEUMONIA vaccine once before and once after age 56 years  GOALS:  * Your Hemoglobin A1c of <7%  * fasting sugars need to be <130 * after meals sugars need to be <180 (2h after you start eating) * Your Systolic BP should be 562 or lower  * Your Diastolic BP should be 80 or lower  * Your HDL (Good Cholesterol) should be 40 or higher  * Your LDL (Bad Cholesterol) should be 100 or lower. * Your Triglycerides should be 150 or lower  * Your Urine microalbumin (kidney function) should be <30 * Your Body Mass Index should be 25 or lower    Please consider the following ways to cut down carbs and fat and increase fiber and micronutrients in your diet: - substitute whole grain for white bread or pasta - substitute brown rice for white rice - substitute 90-calorie flat bread pieces for slices of bread when possible - substitute sweet potatoes or yams for white potatoes - substitute humus for margarine - substitute tofu for cheese  when possible - substitute almond or rice milk for regular milk (would not drink soy milk daily due to concern for soy estrogen influence on breast cancer risk) - substitute dark chocolate for other sweets when possible - substitute water - can add lemon or orange slices for taste - for diet sodas (artificial sweeteners will trick your body that you can eat sweets without getting calories and will lead you to overeating and weight gain in the long run) - do not skip  breakfast or other meals (this will slow down the metabolism and will result in more weight gain over time)  - can try smoothies made from fruit and almond/rice milk in am instead of regular breakfast - can also try old-fashioned (not instant) oatmeal made with almond/rice milk in am - order the dressing on the side when eating salad at a restaurant (pour less than half of the dressing on the salad) - eat as little meat as possible - can try juicing, but should not forget that juicing will get rid of the fiber, so would alternate with eating raw veg./fruits or drinking smoothies - use as little oil as possible, even when using olive oil - can dress a salad with a mix of balsamic vinegar and lemon juice, for e.g. - use agave nectar, stevia sugar, or regular sugar rather than artificial sweateners - steam or broil/roast veggies  - snack on veggies/fruit/nuts (unsalted, preferably) when possible, rather than processed foods - reduce or eliminate aspartame in diet (it is in diet sodas, chewing gum, etc) Read the labels!  Try to read Dr. Janene Harvey book: "Program for Reversing Diabetes" for other ideas for healthy eating.

## 2018-11-26 ENCOUNTER — Other Ambulatory Visit: Payer: Self-pay

## 2018-11-29 ENCOUNTER — Other Ambulatory Visit: Payer: Managed Care, Other (non HMO)

## 2018-11-30 ENCOUNTER — Ambulatory Visit (INDEPENDENT_AMBULATORY_CARE_PROVIDER_SITE_OTHER): Payer: Managed Care, Other (non HMO)

## 2018-11-30 ENCOUNTER — Other Ambulatory Visit (INDEPENDENT_AMBULATORY_CARE_PROVIDER_SITE_OTHER): Payer: Managed Care, Other (non HMO)

## 2018-11-30 ENCOUNTER — Other Ambulatory Visit: Payer: Self-pay

## 2018-11-30 DIAGNOSIS — E875 Hyperkalemia: Secondary | ICD-10-CM

## 2018-11-30 DIAGNOSIS — D72819 Decreased white blood cell count, unspecified: Secondary | ICD-10-CM | POA: Diagnosis not present

## 2018-11-30 DIAGNOSIS — E139 Other specified diabetes mellitus without complications: Secondary | ICD-10-CM

## 2018-11-30 LAB — CBC WITH DIFFERENTIAL/PLATELET
BASOS ABS: 0 10*3/uL (ref 0.0–0.1)
Basophils Relative: 0.8 % (ref 0.0–3.0)
Eosinophils Absolute: 0 10*3/uL (ref 0.0–0.7)
Eosinophils Relative: 0.9 % (ref 0.0–5.0)
HCT: 40 % (ref 36.0–46.0)
Hemoglobin: 13.8 g/dL (ref 12.0–15.0)
LYMPHS PCT: 36.3 % (ref 12.0–46.0)
Lymphs Abs: 1 10*3/uL (ref 0.7–4.0)
MCHC: 34.5 g/dL (ref 30.0–36.0)
MCV: 91 fl (ref 78.0–100.0)
MONOS PCT: 9.1 % (ref 3.0–12.0)
Monocytes Absolute: 0.2 10*3/uL (ref 0.1–1.0)
Neutro Abs: 1.4 10*3/uL (ref 1.4–7.7)
Neutrophils Relative %: 52.9 % (ref 43.0–77.0)
Platelets: 150 10*3/uL (ref 150.0–400.0)
RBC: 4.4 Mil/uL (ref 3.87–5.11)
RDW: 13.5 % (ref 11.5–15.5)
WBC: 2.6 10*3/uL — ABNORMAL LOW (ref 4.0–10.5)

## 2018-11-30 LAB — CORTISOL
Cortisol, Plasma: 16.8 ug/dL
Cortisol, Plasma: 24.2 ug/dL
Cortisol, Plasma: 28 ug/dL

## 2018-11-30 MED ORDER — COSYNTROPIN 0.25 MG IJ SOLR
0.2500 mg | Freq: Once | INTRAMUSCULAR | Status: AC
  Administered 2018-11-30: 0.25 mg via INTRAVENOUS

## 2018-11-30 NOTE — Progress Notes (Signed)
Per orders of Philemon Kingdom, MD injection of Cortrosyn given today by Lenna Sciara, Physiological scientist. Patient tolerated injection well.

## 2018-12-01 ENCOUNTER — Other Ambulatory Visit: Payer: Self-pay

## 2018-12-01 DIAGNOSIS — D72819 Decreased white blood cell count, unspecified: Secondary | ICD-10-CM

## 2018-12-03 LAB — ACTH: C206 ACTH: 15 pg/mL (ref 6–50)

## 2018-12-04 LAB — ZNT8 ANTIBODIES: ZNT8 Antibodies: <15 U/mL

## 2018-12-04 LAB — ANTI-ISLET CELL ANTIBODY: Islet Cell Ab: NEGATIVE

## 2019-02-01 ENCOUNTER — Telehealth: Payer: Self-pay | Admitting: Family Medicine

## 2019-02-01 NOTE — Telephone Encounter (Signed)
Pt cancelled 6/4 appt because she stated she is seeing endo for diabetes and pt stated she has a continuous glucose monitor as well. Pt stated if Dr. Yong Channel sees that she needs to come in then she will but otherwise she has cancelled.

## 2019-02-01 NOTE — Telephone Encounter (Signed)
Very reasonable. 

## 2019-02-01 NOTE — Telephone Encounter (Signed)
Noted  

## 2019-02-07 ENCOUNTER — Other Ambulatory Visit: Payer: Managed Care, Other (non HMO)

## 2019-02-10 ENCOUNTER — Ambulatory Visit: Payer: Managed Care, Other (non HMO) | Admitting: Family Medicine

## 2019-02-14 ENCOUNTER — Telehealth: Payer: Self-pay | Admitting: Internal Medicine

## 2019-02-14 ENCOUNTER — Telehealth: Payer: Self-pay

## 2019-02-14 NOTE — Telephone Encounter (Signed)
We only received the fax on Friday AFTER the clinic was closed, so today is the first I have heard of it.

## 2019-02-14 NOTE — Telephone Encounter (Signed)
Per patient a prior authorization request was sent by her insurance on 02/12/2019 for [employer].  Patient is following up on that PA.  Per patient her Rx needs to be refilled this week.  Please call her at [phone].

## 2019-02-14 NOTE — Telephone Encounter (Signed)
PA forms for patient have been faxed.

## 2019-02-15 ENCOUNTER — Telehealth: Payer: Self-pay

## 2019-02-15 NOTE — Telephone Encounter (Signed)
PA for Freestyle Libre CGM has been denied by insurance due to patient not taking insulin.

## 2019-03-15 ENCOUNTER — Other Ambulatory Visit: Payer: Self-pay

## 2019-03-17 ENCOUNTER — Other Ambulatory Visit: Payer: Self-pay

## 2019-03-17 ENCOUNTER — Other Ambulatory Visit: Payer: Self-pay | Admitting: Internal Medicine

## 2019-03-17 ENCOUNTER — Ambulatory Visit (INDEPENDENT_AMBULATORY_CARE_PROVIDER_SITE_OTHER): Payer: Managed Care, Other (non HMO) | Admitting: Internal Medicine

## 2019-03-17 ENCOUNTER — Encounter: Payer: Self-pay | Admitting: Internal Medicine

## 2019-03-17 ENCOUNTER — Other Ambulatory Visit (INDEPENDENT_AMBULATORY_CARE_PROVIDER_SITE_OTHER): Payer: Managed Care, Other (non HMO)

## 2019-03-17 ENCOUNTER — Telehealth: Payer: Self-pay | Admitting: Internal Medicine

## 2019-03-17 VITALS — BP 100/60 | HR 58 | Ht 60.0 in | Wt 107.0 lb

## 2019-03-17 DIAGNOSIS — R739 Hyperglycemia, unspecified: Secondary | ICD-10-CM

## 2019-03-17 DIAGNOSIS — E875 Hyperkalemia: Secondary | ICD-10-CM

## 2019-03-17 DIAGNOSIS — E139 Other specified diabetes mellitus without complications: Secondary | ICD-10-CM | POA: Diagnosis not present

## 2019-03-17 LAB — POCT GLYCOSYLATED HEMOGLOBIN (HGB A1C): Hemoglobin A1C: 7.3 % — AB (ref 4.0–5.6)

## 2019-03-17 MED ORDER — GLUCAGON EMERGENCY 1 MG/ML IJ SOLR: 1.0000 mL | Freq: Once | INTRAMUSCULAR | 11 refills | Status: AC

## 2019-03-17 MED ORDER — INSULIN PEN NEEDLE 32G X 4 MM MISC: 3 refills | Status: DC

## 2019-03-17 MED ORDER — NOVOLOG FLEXPEN 100 UNIT/ML ~~LOC~~ SOPN: 2.0000 [IU] | PEN_INJECTOR | Freq: Three times a day (TID) | SUBCUTANEOUS | 11 refills | Status: DC

## 2019-03-17 MED ORDER — GLUCOSE BLOOD VI STRP: ORAL_STRIP | 3 refills | Status: DC

## 2019-03-17 MED ORDER — GLUCAGON 3 MG/DOSE NA POWD: 3.0000 mg | Freq: Once | NASAL | 11 refills | Status: AC

## 2019-03-17 MED ORDER — INSULIN LISPRO (1 UNIT DIAL) 100 UNIT/ML (KWIKPEN): 2.0000 [IU] | PEN_INJECTOR | Freq: Three times a day (TID) | SUBCUTANEOUS | 11 refills | Status: DC

## 2019-03-17 NOTE — Telephone Encounter (Signed)
Caller stated she was given a new prescription for Novolog, not covered by insurance, pharmacy has requested authorization to switch to the covered insulin, pharmacy hasn't gotten that permission yet. Will also need the Freestyle continuous glucose monitoring system reevaluated, needs to have the form re-sent. Will also need the test strips that match the meter she has, will need new prescription. Please call when you're able.

## 2019-03-17 NOTE — Patient Instructions (Signed)
Please start: - NovoLog 2-3 units 15 min before the 3 meals  Get the Glucagon from the pharmacy.  When injecting insulin:  Inject in the abdomen  Rotate the injection sites around the belly button  Change needle for each injection  Keep needle in for 6 sec after last unit of insulin in  Keep the insulin in use out of the fridge  Please return in 3 months.

## 2019-03-17 NOTE — Telephone Encounter (Signed)
Strips have been sent.  Patient still does not qualify for Elenor Legato because she is not on BOTH long and rapid acting insulin. Dr. Cruzita Lederer suggest she call her insurance and ask about the Dexcom.  As for the Novolog not being covered, we notified this afternoon but seeing patients so had not been able to send to Dr. Cruzita Lederer.  Ok to switch to Humalog per Dr. Cruzita Lederer so I have sent that in.

## 2019-03-17 NOTE — Progress Notes (Signed)
Patient ID: Brittany Barton, female   DOB: [birth year], 58 y.o.   MRN: [id number]   HPI: Brittany Barton is a 58 y.o.-year-old female, initially referred by her PCP, Dr. 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  Last visit 4 months ago.  In 02/2015 she started a plant-based diet and she lost more than 80 pounds since then.  Sugars initially improved but then her HbA1c started to increase despite a fairly strict diet.  She was diagnosed with LADA after he anti-pancreatic antibodies returned positive.  At last visit, since sugars were excellent, we did not have to start her on insulin, but at this visit, they are higher, especially after meals.  Reviewed latest HbA1c levels: Lab Results  Component Value Date   HGBA1C 6.6 (H) 11/08/2018   HGBA1C 6.0 08/04/2018   HGBA1C 5.4 03/09/2018   HGBA1C 7.6 12/03/2017   HGBA1C 6.6 (H) 08/06/2017   HGBA1C 5.9 08/04/2016   HGBA1C 5.7 07/31/2015   HGBA1C 5.6 05/24/2013   She had a low C-peptide and high GAD antibodies: Component     Latest Ref Rng & Units 11/08/2018  Glucose     70 - 99 mg/dL 130 (H)  C-Peptide     0.80 - 3.85 ng/mL 0.55 (L)  Glutamic Acid Decarb Ab     <5 IU/mL >250 (H)  Insulin Antibodies, Human     <0.4 U/mL 3.6 (H)   Component     Latest Ref Rng & Units 11/30/2018  ZNT8 Antibodies     U/mL <15  Islet Cell Ab     Neg:<1:1 Negative   She had a high potassium and low sodium at last check: Component     Latest Ref Rng & Units 11/08/2018  Sodium     135 - 145 mEq/L 133 (L)  Potassium     3.5 - 5.1 mEq/L 5.7 (H)   After last visit, we checked a cosyntropin stimulation test which ruled out adrenal insufficiency: Component     Latest Ref Rng & Units 11/30/2018 11/30/2018 11/30/2018         8:15 AM  8:59 AM  9:26 AM  Cortisol, Plasma     ug/dL 16.8 24.2 28.0  C206 ACTH     6 - 50 pg/mL 15      She is not on any medications for her diabetes but controls this by diet: Plant-based diet, without high glycemic index foods.  Pt checks her sugars >4x times a day with her freestyle libre CGM:Pt.checks her sugars more than 4 times a day with his CGM.  Freestyle libre CGM parameters: - Average: 168 - % active CGM time: 92% of the time - Glucose variability 23.3 (target < or = to 36%) - time in range:  - very low (<54): 0% - low (54-69): 0% - normal range (70-180): 65% - high sugars (181-250): 33% - very high sugars (>250): 2%    At last visit: - am: 111-142 - 2h after b'fast: 87-133 - before lunch: 65, 82 - 2h after lunch: 155, 166 - before dinner: n/c - 2h after dinner: 111-140 - bedtime: n/c - nighttime: n/c Lowest sugar was 65 >> 60s x1; she has hypoglycemia awareness at 70.  Highest sugar was 166 >> 280.  Glucometer: Accuchek  Pt's meals are: - Breakfast: Raw/soaked oatmeal, blueberries or other fruit, ground flaxseed, walnuts (2 tablespoons), soy milk - Lunch: Beans,  whole grains, leafy greens, other vegetables and fruit - Dinner: Salad with beans/peas or vegetable-based soup. - Snacks: 0-2-fruit, veggies, homemade bean dip  - No CKD, last BUN/creatinine:  Lab Results  Component Value Date   BUN 6 11/08/2018   BUN 8 08/04/2018   CREATININE 0.56 11/08/2018   CREATININE 0.50 08/04/2018  Not on an ACE inhibitor/ARB  -No HL; last set of lipids: Lab Results  Component Value Date   CHOL 149 08/04/2018   HDL 71.70 08/04/2018   LDLCALC 69 08/04/2018   TRIG 38.0 08/04/2018   CHOLHDL 2 08/04/2018  Not on a statin.  - last eye exam was in summer 2019: No DR  -No numbness and tingling in her feet.  Pt has FH of DM in MGM.  Brother with otosclerosis.  ROS: Constitutional: no weight gain/no weight loss, no fatigue, + subjective hyperthermia, no subjective hypothermia Eyes: no blurry vision, no xerophthalmia ENT: no sore throat, no nodules palpated in  neck, no dysphagia, no odynophagia, no hoarseness Cardiovascular: no CP/no SOB/no palpitations/no leg swelling Respiratory: no cough/no SOB/no wheezing Gastrointestinal: no N/no V/no D/no C/no acid reflux Musculoskeletal: no muscle aches/no joint aches Skin: no rashes, no hair loss Neurological: no tremors/+ numbness - legs/no tingling/no dizziness  I reviewed pt's medications, allergies, PMH, social hx, family hx, and changes were documented in the history of present illness. Otherwise, unchanged from my initial visit note.  Past Medical History:  Diagnosis Date  . Chicken pox   . Depression    Resolved. in 26s- meds and counseling.  . Family history of hemochromatosis    father- screen iron  . Gestational diabetes 1987   a1c q3 years  . Hammer toe   . Morton's neuroma    per records   Past Surgical History:  Procedure Laterality Date  . ENDOMETRIAL ABLATION  2010   heavy menstrual bleeding  . TONSILLECTOMY AND ADENOIDECTOMY  1965   Social History   Socioeconomic History  . Marital status: Married    Spouse name: Not on file  . Number of children: 2  . Years of education: Not on file  . Highest education level: Not on file  Occupational History  .  Librarian  Social Needs  . Financial resource strain: Not on file  . Food insecurity:    Worry: Not on file    Inability: Not on file  . Transportation needs:    Medical: Not on file    Non-medical: Not on file  Tobacco Use  . Smoking status: Never Smoker  . Smokeless tobacco: Never Used  Substance and Sexual Activity  . Alcohol use: Yes    Alcohol/week:  2-4 drinks per week    Types:  Wine/beer  . Drug use: No  . Sexual activity: Yes  Lifestyle  . Physical activity: Walk/hike/spin class/weight resistance/stretching    Days per week: 7    Minutes per session: Not on file  Relationships  Social History Narrative   Married. (husband [name] of Dr. Yong Channel). 2 children grown and married. Will be grandmother  December 2016.    Has had 12 moves in 32 years, most recently [place] for 1.5 years, [place] 5 years prior, [place] prior      B.A. In nutrition. Masters in [employer]. In between jobs as Licensed conveyancer.       Current Outpatient Medications on File Prior to Visit  Medication Sig Dispense Refill  . acyclovir ointment (ZOVIRAX) 5 % Apply 1 application topically every 3 (three) hours. [place]  g 1  . Cholecalciferol (D3 HIGH POTENCY) 2000 units CAPS Take 1 capsule by mouth daily.    . Continuous Blood Gluc Sensor (FREESTYLE LIBRE 14 DAY SENSOR) MISC 1 each by Does not apply route every 14 (fourteen) days. Change every 2 weeks 2 each 11  . glucose blood test strip Use to test blood sugar daily 100 each 3  . Lancets (ACCU-CHEK SOFT TOUCH) lancets Use to test blood sugar daily 100 each 3  . vitamin B-12 (CYANOCOBALAMIN) 250 MCG tablet Take 250 mcg by mouth daily.     No current facility-administered medications on file prior to visit.    Allergies  Allergen Reactions  . Sulfa Antibiotics   . Penicillins Rash   Family History  Problem Relation Age of Onset  . Hemochromatosis Father   . Alcoholism Father   . Hearing loss Brother        otosclerosis  . Hypertension Brother   . Hyperlipidemia Brother   . Stroke Other        grandparent  . Hypertension Other        grandparent  . Osteoporosis Mother   . Depression Mother        dealing with some memory issues  . Glaucoma Mother   . Diabetes Brother        ? PE as well- she will verify  . Hyperlipidemia Brother      PE: BP 100/60   Pulse (!) 58   Ht 5' (1.524 m)   Wt 107 lb (48.5 kg)   LMP 09/09/2007 (Approximate) Comment: this was with her endometrial ablation  SpO2 99%   BMI 20.90 kg/m  Wt Readings from Last 3 Encounters:  03/17/19 107 lb (48.5 kg)  11/23/18 107 lb (48.5 kg)  11/08/18 105 lb 9.6 oz (47.9 kg)   Constitutional: normal weight, in NAD Eyes: PERRLA, EOMI, no exophthalmos ENT: moist mucous membranes, no  thyromegaly, no cervical lymphadenopathy Cardiovascular: RRR, No MRG Respiratory: CTA B Gastrointestinal: abdomen soft, NT, ND, BS+ Musculoskeletal: no deformities, strength intact in all 4 Skin: moist, warm, no rashes Neurological: no tremor with outstretched hands, DTR normal in all 4  ASSESSMENT: 1. LADA, non-insulin-dependent, controlled, without long-term complications but with occasional hyperglycemia  2. Hyperkalemia and hyponatremia  PLAN:  1. Patient with several years of controlled diabetes on a plant-based diet.  She also keeps a strict exercise routine, exercising every day.  She does have a positive GAD and anti-insulin antibody titer, qualifying her for a diagnosis of LADA.  However, due to the strict exercise and diet routine, we did not have to start her on any medications.  We did discuss at last visit about her weight loss and I suggested to introduce some higher calorie foods, and given specific examples.  We are trying to avoid insulin resistance. -We also discussed at last visit about the controversy in the field about starting insulin in patients with good blood sugars in the presence of anti-pancreatic antibodies.  At that time we decided to hold off on his and continue to follow her with frequent blood sugar checks.  I advised her to get in touch with me if she starts seeing increased variability of her blood sugars or slowly increasing CBGs.  -At this visit, she describes that her sugars did increase since her previous visit and they are higher after meals.  She is trying to exercise (by recumbent bike) after every meal, but she finds it hard to do so.  She exercises  at least once a day now.  However, reviewing her CGM downloads (we will scan these), her sugars increase after every meal, less likely after dinner, which is around the time when she exercises.  At this point, we discussed about adding rapid acting insulin.  I reviewed with her insulin pen use and correct  injection techniques.  I also explained when to take the insulin the relationship with a meal.  We will start with a low dose, which I advised her how to adjust based on activity after the meal and the size of the meal.  I explained that this is an unusual regimen since it does not contain long-acting insulin, however, per review of her CGM downloads, she does not require this for now. -We also discussed about situations in which she may need glucagon and how to use the spray or the injection.  I sent both to her pharmacy and advised her to get the spray if this is covered by her insurance. - At this visit, we will recheck her glucose and C-peptide - we checked her HbA1c: 7.3% (higher - advised to check sugars at different times of the day - 4x a day, rotating check times - advised for yearly eye exams >> she is UTD - return to clinic in 3 months  2. Hyperkalemia and hyponatremia -After last visit, we ruled out adrenal insufficiency by a cosyntropin stimulation test -We will repeat a BMP today  Philemon Kingdom, MD PhD [facility] Endocr[facility]

## 2019-03-17 NOTE — Addendum Note (Signed)
Addended by: Cardell Peach I on: 03/17/2019 10:23 AM   Modules accepted: Orders

## 2019-03-18 LAB — BASIC METABOLIC PANEL WITH GFR
BUN: 7 mg/dL (ref 7–25)
CO2: 27 mmol/L (ref 20–32)
Calcium: 9.3 mg/dL (ref 8.6–10.4)
Chloride: 94 mmol/L — ABNORMAL LOW (ref 98–110)
Creat: 0.55 mg/dL (ref 0.50–1.05)
GFR, Est African American: 120 mL/min/{1.73_m2} (ref >=60)
GFR, Est Non African American: 103 mL/min/{1.73_m2} (ref >=60)
Glucose, Bld: 192 mg/dL — ABNORMAL HIGH (ref 65–99)
Potassium: 4.2 mmol/L (ref 3.5–5.3)
Sodium: 130 mmol/L — ABNORMAL LOW (ref 135–146)

## 2019-03-18 LAB — C-PEPTIDE: C-Peptide: 0.55 ng/mL — ABNORMAL LOW (ref 0.80–3.85)

## 2019-06-20 ENCOUNTER — Encounter: Payer: Self-pay | Admitting: Internal Medicine

## 2019-06-20 ENCOUNTER — Ambulatory Visit (INDEPENDENT_AMBULATORY_CARE_PROVIDER_SITE_OTHER): Payer: Managed Care, Other (non HMO) | Admitting: Internal Medicine

## 2019-06-20 ENCOUNTER — Other Ambulatory Visit: Payer: Self-pay

## 2019-06-20 VITALS — BP 98/70 | HR 75 | Ht 60.25 in | Wt 111.0 lb

## 2019-06-20 DIAGNOSIS — Z23 Encounter for immunization: Secondary | ICD-10-CM

## 2019-06-20 DIAGNOSIS — E139 Other specified diabetes mellitus without complications: Secondary | ICD-10-CM

## 2019-06-20 LAB — POCT GLYCOSYLATED HEMOGLOBIN (HGB A1C): Hemoglobin A1C: 5.6 % (ref 4.0–5.6)

## 2019-06-20 NOTE — Progress Notes (Signed)
Patient ID: Brittany Barton, female   DOB: 1960-11-09, 58 y.o.   MRN: [id number]   HPI: Brittany Barton is a 58 y.o.-year-old female, initially referred by her PCP, Dr. 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  Last visit 3 months ago.  At last visit, her postprandial blood sugars were high so we added mealtime insulin.  Reviewed HbA1c levels: Lab Results  Component Value Date   HGBA1C 7.3 (A) 03/17/2019   HGBA1C 6.6 (H) 11/08/2018   HGBA1C 6.0 08/04/2018   HGBA1C 5.4 03/09/2018   HGBA1C 7.6 12/03/2017   HGBA1C 6.6 (H) 08/06/2017   HGBA1C 5.9 08/04/2016   HGBA1C 5.7 07/31/2015   HGBA1C 5.6 05/24/2013   She had a low C-peptide and high GAD antibodies: Component     Latest Ref Rng & Units 03/17/2019  Glucose     65 - 99 mg/dL 192 (H)  C-Peptide     0.80 - 3.85 ng/mL 0.55 (L)   Component     Latest Ref Rng & Units 11/08/2018  Glucose     70 - 99 mg/dL 130 (H)  C-Peptide     0.80 - 3.85 ng/mL 0.55 (L)  Glutamic Acid Decarb Ab     <5 IU/mL >250 (H)  Insulin Antibodies, Human     <0.4 U/mL 3.6 (H)   Component     Latest Ref Rng & Units 11/30/2018  ZNT8 Antibodies     U/mL <15  Islet Cell Ab     Neg:<1:1 Negative   She is on: - Novolog 2-5 units before meals:  B: 4-5 units L: 3-4 units D: 2-4 units  Pt.checks her sugars more than 4 times a day with his CGM.  Freestyle libre CGM parameters: - Average: 116 - time in range:  - Below target: 7% - normal range (70-180): 77% - Above target: 16%    She has a history of a high potassium and low sodium: Component     Latest Ref Rng & Units 11/08/2018  Sodium     135 - 145 mEq/L 133 (L)  Potassium     3.5 - 5.1 mEq/L 5.7 (H)   We will rule out adrenal insufficiency by a cosyntropin stimulation test: Component     Latest Ref Rng & Units 11/30/2018 11/30/2018 11/30/2018         8:15 AM   8:59 AM  9:26 AM  Cortisol, Plasma     ug/dL 16.8 24.2 28.0  C206 ACTH     6 - 50 pg/mL 15     She continues a plant-based diet, without high glycemic index foods. In 02/2015 she started a plant-based diet and lost more than 80 pounds.  Sugars initially improved but then her HbA1c started to increase despite a fairly strict diet.  She checks her sugars more than 4 times a day with her freestyle libre CGM - we could not download this: - am: 120-130 - 2h after b'fast: up to 180 - lunch: 70-130 - 2h after lunch: 130-150 - dinner: 70-130 - 2h after dinner: Up to 170, rarely 180  Prev.: Freestyle libre CGM parameters: - Average: 168 - % active CGM time: 92% of the time - Glucose variability 23.3 (target < or = to 36%) - time in range:  - very low (<54): 0% - low (54-69): 0% - normal range (70-180): 65% - high sugars (181-250): 33% - very  high sugars (>250): 2%    Lowest sugar was 65 >> 60s x1 >> 40; she has hypoglycemia awareness at 70.  Highest sugar was 166 >> 280 >> 230.  Glucometer: Accuchek  Pt's meals are: - Breakfast: Raw/soaked oatmeal, blueberries or other fruit, ground flaxseed, walnuts (2 tablespoons), soy milk - Lunch: Beans, whole grains, leafy greens, other vegetables and fruit - Dinner: Salad with beans/peas or vegetable-based soup. - Snacks: 0-2-fruit, veggies, homemade bean dip  -No CKD, last BUN/creatinine:  Lab Results  Component Value Date   BUN 7 03/17/2019   BUN 6 11/08/2018   CREATININE 0.55 03/17/2019   CREATININE 0.56 11/08/2018  Not on an ACE inhibitor/ARB  -+ History of HL; last set of lipids: Lab Results  Component Value Date   CHOL 149 08/04/2018   HDL 71.70 08/04/2018   LDLCALC 69 08/04/2018   TRIG 38.0 08/04/2018   CHOLHDL 2 08/04/2018  She is not on a statin.  - last eye exam was in 03/2018: No DR  -Denies numbness and tingling in her feet.  Pt has FH of DM in [place].  Brother with otosclerosis.  ROS: Constitutional: no  weight gain/no weight loss, no fatigue, no subjective hyperthermia, no subjective hypothermia Eyes: no blurry vision, no xerophthalmia ENT: no sore throat, no nodules palpated in neck, no dysphagia, no odynophagia, no hoarseness Cardiovascular: no CP/no SOB/no palpitations/no leg swelling Respiratory: no cough/no SOB/no wheezing Gastrointestinal: no N/no V/no D/no C/no acid reflux Musculoskeletal: no muscle aches/no joint aches Skin: no rashes, no hair loss Neurological: no tremors/no numbness/no tingling/no dizziness  I reviewed pt's medications, allergies, PMH, social hx, family hx, and changes were documented in the history of present illness. Otherwise, unchanged from my initial visit note.  Past Medical History:  Diagnosis Date  . Chicken pox   . Depression    Resolved. in 71s- meds and counseling.  . Family history of hemochromatosis    father- screen iron  . Gestational diabetes 1987   a1c q3 years  . Hammer toe   . Morton's neuroma    per records   Past Surgical History:  Procedure Laterality Date  . ENDOMETRIAL ABLATION  2010   heavy menstrual bleeding  . TONSILLECTOMY AND ADENOIDECTOMY  1965   Social History   Socioeconomic History  . Marital status: Married    Spouse name: Not on file  . Number of children: 2  . Years of education: Not on file  . Highest education level: Not on file  Occupational History  .  Librarian  Social Needs  . Financial resource strain: Not on file  . Food insecurity:    Worry: Not on file    Inability: Not on file  . Transportation needs:    Medical: Not on file    Non-medical: Not on file  Tobacco Use  . Smoking status: Never Smoker  . Smokeless tobacco: Never Used  Substance and Sexual Activity  . Alcohol use: Yes    Alcohol/week:  2-4 drinks per week    Types:  Wine/beer  . Drug use: No  . Sexual activity: Yes  Lifestyle  . Physical activity: Walk/hike/spin class/weight resistance/stretching    Days per week: 7     Minutes per session: Not on file  Relationships  Social History Narrative   Married. (husband [name] of Dr. Yong Channel). 2 children grown and married. Will be grandmother December 2016.    Has had 12 moves in 32 years, most recently [place] for 1.5 years, [place] 5  years prior, [place] prior      B.A. In nutrition. Masters in [employer]. In between jobs as Licensed conveyancer.       Current Outpatient Medications on File Prior to Visit  Medication Sig Dispense Refill  . acyclovir ointment (ZOVIRAX) 5 % Apply 1 application topically every 3 (three) hours. 30 g 1  . Cholecalciferol (D3 HIGH POTENCY) 2000 units CAPS Take 1 capsule by mouth daily.    . Continuous Blood Gluc Sensor (FREESTYLE LIBRE 14 DAY SENSOR) MISC 1 each by Does not apply route every 14 (fourteen) days. Change every 2 weeks 2 each 11  . glucose blood test strip Use to test blood sugar daily 100 each 3  . insulin lispro (HUMALOG KWIKPEN) 100 UNIT/ML KwikPen Inject 0.02-0.04 mLs (2-4 Units total) into the skin 3 (three) times daily with meals. 15 mL 11  . Insulin Pen Needle 32G X 4 MM MISC Use 3x a day - with insulin 100 each 3  . Lancets (ACCU-CHEK SOFT TOUCH) lancets Use to test blood sugar daily 100 each 3  . vitamin B-12 (CYANOCOBALAMIN) 250 MCG tablet Take 250 mcg by mouth daily.     No current facility-administered medications on file prior to visit.    Allergies  Allergen Reactions  . Sulfa Antibiotics   . Penicillins Rash   Family History  Problem Relation Age of Onset  . Hemochromatosis Father   . Alcoholism Father   . Hearing loss Brother        otosclerosis  . Hypertension Brother   . Hyperlipidemia Brother   . Stroke Other        grandparent  . Hypertension Other        grandparent  . Osteoporosis Mother   . Depression Mother        dealing with some memory issues  . Glaucoma Mother   . Diabetes Brother        ? PE as well- she will verify  . Hyperlipidemia Brother    PE: BP 98/70   Pulse  75   Ht 5' 0.25" (1.53 m) Comment: measured today without shoes  Wt 111 lb (50.3 kg)   LMP 09/09/2007 (Approximate) Comment: this was with her endometrial ablation  SpO2 98%   BMI 21.50 kg/m  Wt Readings from Last 3 Encounters:  06/20/19 111 lb (50.3 kg)  03/17/19 107 lb (48.5 kg)  11/23/18 107 lb (48.5 kg)   Constitutional: normal weight, in NAD Eyes: PERRLA, EOMI, no exophthalmos ENT: moist mucous membranes, no thyromegaly, no cervical lymphadenopathy Cardiovascular: RRR, No MRG Respiratory: CTA B Gastrointestinal: abdomen soft, NT, ND, BS+ Musculoskeletal: no deformities, strength intact in all 4 Skin: moist, warm, no rashes Neurological: no tremor with outstretched hands, DTR normal in all 4  ASSESSMENT: 1. LADA, now insulin-dependent, controlled, without long-term complications but with occasional hyperglycemia  2. HL  PLAN:  1. Patient with several years of controlled diabetes on a plant-based diet.  She also keeps a strict exercise routine, exercising every day.  She has positive GAD antibodies, qualifying her for a diagnosis of LADA.  Due to the strict exercise and diet routine, she could avoid starting insulin for longer periods of time, however, at last visit, her sugars were higher after every meal, up to 250 despite using her recumbent bike after almost every meal, and I suggested to add rapid acting insulin.  I advised her how to adjust the doses of insulin based on the composition of her meal and also the  proper timing between the insulin injection and the meal.  Since sugars were controlled between meals, I did not feel that she needed to add long-acting insulin at that time.  We also discussed at last visit about situations in which she may need glucagon and how to use the spray/injection.  I sent both to her pharmacy and advised her to get the spray if this was covered by her insurance. - At this visit, sugars are much better, almost all at goal. They are slightly  higher: 120-130 in am (dawn phenomenon) and she may take the insulin before b'fast 30 min-1h before the meal to allow the CBG to decrease before she eats. - she is adjusting the insulin dose based on the nr of carbs and does a great job with this  - no need to change this - also, for now, she does not appear to need long acting insulin - we checked her HbA1c: 5.6% (better) - advised to check sugars at different times of the day - 3x a day, rotating check times - advised for yearly eye exams >> she is UTD - + flu shot today - return to clinic in 3-4 months  2. HL - Reviewed latest lipid panel from 07/2018: All fractions at goal Lab Results  Component Value Date   CHOL 149 08/04/2018   HDL 71.70 08/04/2018   LDLCALC 69 08/04/2018   TRIG 38.0 08/04/2018   CHOLHDL 2 08/04/2018  - not on a statin - Continues a plant-based diet, which is likely helping tremendously improving her lipid fractions - will recheck at next OV  - time spent with the patient: 30 min, of which >50% was spent in reviewing her CGM downloads, discussing her hypo- and hyper-glycemic episodes, reviewing previous labs and insulin doses, and developing a plan to avoid hypo- and hyper-glycemia.    Philemon Kingdom, MD PhD [facility] End[facility]

## 2019-06-20 NOTE — Patient Instructions (Signed)
Please continue: - NovoLog 2-5 units 15 min before the 3 meals  Please return in 3-4 months.

## 2019-06-23 ENCOUNTER — Telehealth: Payer: Self-pay | Admitting: Obstetrics and Gynecology

## 2019-06-23 NOTE — Telephone Encounter (Signed)
Patient discussed having surgery with Dr.Silva at last aex 08/2018.  She would like to discuss this further. She has forgotten then details that they had discussed.

## 2019-06-23 NOTE — Telephone Encounter (Signed)
Spoke with patient. Seen in office on 09/06/18 for pessary f/u. Patient states surgery was discussed, would potentially like to proceed with surgery. Patient is requesting to review benefits.   Reviewed 08/2018 OV notes, "We discussed surgical care options for hysterectomy combined with an anterior and posterior colporrhaphy, TVT Exact midurethral sling and cystoscopy".   Advised OV needed for consult with Dr. Quincy Simmonds to proceed with surgery. Patient declines OV at this point, she is in her open enrollment and is requesting to review benefits with current plan and for future planning. Advised I will forward to Dr. Quincy Simmonds and the business office for return call, patient agreeable.    Dr. Quincy Simmonds -please review.   Cc: Lerry Liner

## 2019-06-26 NOTE — Telephone Encounter (Signed)
I do recommend an office visit as I have not seen the patient in almost one year. I want to be sure that I can make a proper recommendation for the type of surgical care that would be appropriate at this time.

## 2019-06-27 DIAGNOSIS — H9201 Otalgia, right ear: Secondary | ICD-10-CM | POA: Insufficient documentation

## 2019-06-27 NOTE — Telephone Encounter (Signed)
Spoke with patient. Patient scheduled for OV pn 10/26 at 1:30pm for surgical consult. Patient verbalizes understanding and is agreeable.   Routing to provider for final review. Patient is agreeable to disposition. Will close encounter.  Cc: Lerry Liner

## 2019-06-30 ENCOUNTER — Other Ambulatory Visit: Payer: Self-pay

## 2019-07-04 ENCOUNTER — Ambulatory Visit (INDEPENDENT_AMBULATORY_CARE_PROVIDER_SITE_OTHER): Payer: Managed Care, Other (non HMO) | Admitting: Obstetrics and Gynecology

## 2019-07-04 ENCOUNTER — Other Ambulatory Visit: Payer: Self-pay

## 2019-07-04 ENCOUNTER — Encounter: Payer: Self-pay | Admitting: Obstetrics and Gynecology

## 2019-07-04 ENCOUNTER — Other Ambulatory Visit: Payer: Self-pay | Admitting: Internal Medicine

## 2019-07-04 VITALS — BP 100/60 | HR 54 | Temp 97.0°F | Ht 60.0 in | Wt 110.0 lb

## 2019-07-04 DIAGNOSIS — N812 Incomplete uterovaginal prolapse: Secondary | ICD-10-CM

## 2019-07-04 NOTE — Progress Notes (Signed)
GYNECOLOGY  VISIT   HPI: 58 y.o.   Married  Caucasian  female   G2P0 with Patient's last menstrual period was 09/09/2007 (approximate).   here for surgical consult. Patient wants to discuss surgery for uterine prolapse.   Patient has a pessary for uterovaginal prolapse. She can have prolapse even with using the pessary. The prolapse comes down with standing. She has a third degree cystocele, second degree uterine prolapse, and almost first degree rectocele noted at prior office visit last year.  She has some urinary urgency and fecal urgency.   Difficulty with internal vaginal climax.  Some difficulty with prolapse during sex. "Something is in the way.'  No leakage with cough, laugh, or sneeze either with the pessary in or out.  Has urinary urgency.  Feels like she does not empty her bladder well, whether or not the pessary is in or out.  No fecal incontinence.  Some first morning fecal urgency.   Removes the pessary once weekly.   A1C 5.6 on 06/20/19.  She is taking insulin now.   Husband working from home for [employer].   GYNECOLOGIC HISTORY: Patient's last menstrual period was 09/09/2007 (approximate). Contraception: Postmenopausal Menopausal hormone therapy:  none Last mammogram: 08-11-18 density B/BIRADS 1 negative Last pap smear: 08-19-18 Neg:Neg HR HPV 08-06-27 Neg        No hx of abnormal paps.   OB History    Gravida  2   Para      Term      Preterm      AB      Living  2     SAB      TAB      Ectopic      Multiple      Live Births                 Patient Active Problem List   Diagnosis Date Noted  . LADA (latent autoimmune diabetes of adulthood) ([place]) 11/23/2018  . Fever blister 08/04/2016  . Hyperlipidemia 08/04/2016  . Depression   . History of gestational diabetes   . Family history of hemochromatosis     Past Medical History:  Diagnosis Date  . Chicken pox   . Depression    Resolved. in  12s- meds and counseling.  . Family history of hemochromatosis    father- screen iron  . Gestational diabetes 1987   a1c q3 years  . Hammer toe   . Morton's neuroma    per records    Past Surgical History:  Procedure Laterality Date  . ENDOMETRIAL ABLATION  2010   heavy menstrual bleeding  . TONSILLECTOMY AND ADENOIDECTOMY  1965    Current Outpatient Medications  Medication Sig Dispense Refill  . acyclovir ointment (ZOVIRAX) 5 % Apply 1 application topically every 3 (three) hours. 30 g 1  . BAQSIMI TWO PACK 3 MG/DOSE POWD PLACE 3 MG INTO THE NOSE ONCE FOR 1 DOSE.    Marland Kitchen BD PEN NEEDLE NANO 2ND GEN 32G X 4 MM MISC USE 3 TIMES A DAY WITH INSULIN 100 each 3  . Cholecalciferol (D3 HIGH POTENCY) 2000 units CAPS Take 1 capsule by mouth daily.    . Continuous Blood Gluc Sensor (FREESTYLE LIBRE 14 DAY SENSOR) MISC 1 each by Does not apply route every 14 (fourteen) days. Change every 2 weeks 2 each 11  . glucose blood test strip Use to test blood sugar daily 100 each 3  . insulin lispro (HUMALOG KWIKPEN) 100 UNIT/ML  KwikPen Inject 0.02-0.04 mLs (2-4 Units total) into the skin 3 (three) times daily with meals. 15 mL 11  . Lancets (ACCU-CHEK SOFT TOUCH) lancets Use to test blood sugar daily 100 each 3  . vitamin B-12 (CYANOCOBALAMIN) 250 MCG tablet Take 250 mcg by mouth daily.     No current facility-administered medications for this visit.      ALLERGIES: Sulfa antibiotics and Penicillins  Family History  Problem Relation Age of Onset  . Hemochromatosis Father   . Alcoholism Father   . Hearing loss Brother        otosclerosis  . Hypertension Brother   . Hyperlipidemia Brother   . Stroke Other        grandparent  . Hypertension Other        grandparent  . Osteoporosis Mother   . Depression Mother        dealing with some memory issues  . Glaucoma Mother   . Diabetes Brother        ? PE as well- she will verify  . Hyperlipidemia Brother     Social History   Socioeconomic  History  . Marital status: Married    Spouse name: Not on file  . Number of children: Not on file  . Years of education: Not on file  . Highest education level: Not on file  Occupational History  . Not on file  Social Needs  . Financial resource strain: Not on file  . Food insecurity    Worry: Not on file    Inability: Not on file  . Transportation needs    Medical: Not on file    Non-medical: Not on file  Tobacco Use  . Smoking status: Never Smoker  . Smokeless tobacco: Never Used  Substance and Sexual Activity  . Alcohol use: Yes    Alcohol/week: 10.0 standard drinks    Types: 10 Standard drinks or equivalent per week  . Drug use: No  . Sexual activity: Yes  Lifestyle  . Physical activity    Days per week: Not on file    Minutes per session: Not on file  . Stress: Not on file  Relationships  . Social Herbalist on phone: Not on file    Gets together: Not on file    Attends religious service: Not on file    Active member of club or organization: Not on file    Attends meetings of clubs or organizations: Not on file    Relationship status: Not on file  . Intimate partner violence    Fear of current or ex partner: Not on file    Emotionally abused: Not on file    Physically abused: Not on file    Forced sexual activity: Not on file  Other Topics Concern  . Not on file  Social History Narrative   Married. (husband [name] of Dr. Yong Channel). 2 children grown and married. Will be grandmother December 2016.    Has had 12 moves in 32 years, most recently [place] for 1.5 years, [place] 5 years prior, [place] prior      B.A. In nutrition. Masters in [employer]. In between jobs as Licensed conveyancer.        Review of Systems  All other systems reviewed and are negative.   PHYSICAL EXAMINATION:    BP 100/60   Pulse (!) 54   Temp (!) 97 F (36.1 C) (Temporal)   Ht 5' (1.524 m)   Wt 110 lb (49.9 kg)  LMP 09/09/2007 (Approximate) Comment: this was  with her endometrial ablation  BMI 21.48 kg/m     General appearance: alert, cooperative and appears stated age  Pelvic: External genitalia:  no lesions              Urethra:  normal appearing urethra with no masses, tenderness or lesions              Bartholins and Skenes: normal                 Vagina: normal appearing vagina with normal color and discharge, no lesions.  Third degree cystocele, first degree uterine prolapse, and first degree rectocele.  (Patient examined in lithotomy and standing position.)              Cervix: no lesions                Bimanual Exam:  Uterus:  normal size, contour, position, consistency, mobility, non-tender              Adnexa: no mass, fullness, tenderness              Rectal exam: Yes.  .  Confirms.              Anus:  normal sphincter tone, no lesions  Chaperone was present for exam.  ASSESSMENT  Incomplete uterovaginal prolapse.  Using a pessary ring with support. Hx endometrial ablation.  DM.  On insulin.   PLAN  We reviewed her level of uterovaginal prolapse today.  If surgical care planned, I would recommend a LAVH/bilaterally salpingectomy, vaginal cuff support procedure, possible TVT, cystoscopy. I discussed risks and benefits of surgery.  Risks may include but are not limited to bleeding, infection, damage to surrounding organs, reaction to anesthesia, DVT, PE, death, and recurrence of prolapse. She understands she would need 3 months of post op recovery with no lifting over 10 pounds.  Urodynamics with pessary in place would precede surgical care.  I described this procedure and discussed the rationale for testing.  ACOG HOs on prolapse and surgery for prolapse.  She will consider her options and has asked for an estimation of benefits for this surgery.   An After Visit Summary was printed and given to the patient.  __40____ minutes face to face time of which over 50% was spent in counseling.

## 2019-07-04 NOTE — Patient Instructions (Addendum)
I would recommend a laparoscopically assisted vaginal hysterectomy, bilateral salpingectomy, anterior and posterior colporrhaphy, possible TVT Exact midurethral sling and cystoscopy.    Laparoscopically Assisted Vaginal Hysterectomy A laparoscopically assisted vaginal hysterectomy (LAVH) is a surgical procedure to remove the uterus and cervix. Sometimes, the ovaries and fallopian tubes are also removed. This surgery may be done to treat problems such as:  Noncancerous growths in the uterus (uterine fibroids) that cause symptoms.  A condition that causes the lining of the uterus to grow in other areas (endometriosis).  Problems with pelvic support.  Cancer of the cervix, ovaries, uterus, or tissue that lines the uterus (endometrium).  Excessive (dysfunctional) uterine bleeding. During an LAVH, some of the surgical removal is done through the vagina, and the rest is done through a few small incisions in the abdomen. This technique may be an option for women who are not able to have a vaginal hysterectomy. Tell a health care provider about:  Any allergies you have.  All medicines you are taking, including vitamins, herbs, eye drops, creams, and over-the-counter medicines.  Any problems you or family members have had with anesthetic medicines.  Any blood disorders you have.  Any surgeries you have had.  Any medical conditions you have.  Whether you are pregnant or may be pregnant. What are the risks? Generally, this is a safe procedure. However, problems may occur, including:  Infection.  Bleeding.  Allergic reactions to medicines.  Damage to other structures or organs.  Difficulty breathing. What happens before the procedure? Staying hydrated Follow instructions from your health care provider about hydration, which may include:  Up to 2 hours before the procedure - you may continue to drink clear liquids, such as water, clear fruit juice, black coffee, and plain  tea. Eating and drinking restrictions Follow instructions from your health care provider about eating and drinking, which may include:  8 hours before the procedure - stop eating heavy meals or foods such as meat, fried foods, or fatty foods.  6 hours before the procedure - stop eating light meals or foods, such as toast or cereal.  6 hours before the procedure - stop drinking milk or drinks that contain milk.  2 hours before the procedure - stop drinking clear liquids. Medicines  Ask your health care provider about: ? Changing or stopping your regular medicines. This is especially important if you are taking diabetes medicines or blood thinners. ? Taking over-the-counter medicines, vitamins, herbs, and supplements. ? Taking medicines such as aspirin and ibuprofen. These medicines can thin your blood. Do not take these medicines unless your health care provider tells you to take them.  You may be asked to take a medicine to empty your colon (bowel preparation).  You may be given antibiotic medicine to help prevent infection. General instructions  Plan to have someone take you home from the hospital or clinic.  Ask your health care provider how your surgical site will be marked or identified.  You may be asked to shower with a germ-killing soap.  Do not use any products that contain nicotine or tobacco, such as cigarettes and e-cigarettes. These can delay healing after surgery. If you need help quitting, ask your health care provider. What happens during the procedure?  To lower your risk of infection: ? Your health care team will wash or sanitize their hands. ? Hair may be removed from the surgical area. ? Your skin will be washed with soap.  An IV will be inserted into one of your  veins.  You will be given one or more of the following: ? A medicine to help you relax (sedative). ? A medicine to make you fall asleep (general anesthetic).  You may have a flexible tube  (catheter) put into your bladder to drain urine.  You may have a tube put through your nose or mouth down into your stomach (nasogastric tube). The nasogastric tube will remove digestive fluids and prevent nausea and vomiting.  Tight-fitting (compression) stockings will be placed on your legs to promote circulation.  Three or four small incisions will be made in your abdomen. An incision will also be made in your vagina.  Probes and tools will be inserted into the small incisions. The uterus and cervix (and possibly the ovaries and fallopian tubes) will be removed through your vagina as well as through the small incisions that were made in the abdomen.  The incisions will then be closed with stitches (sutures). The procedure may vary among health care providers and hospitals. What happens after the procedure?  Your blood pressure, heart rate, breathing rate, and blood oxygen level will be monitored until the medicines you were given have worn off.  You may have a liquid diet at first. You will most likely return to your usual diet the day after surgery.  You will still have the urinary catheter in place. It will likely be removed the day after surgery.  You may have to wear compression stockings. These stockings help to prevent blood clots and reduce swelling in your legs.  You will be encouraged to walk as soon as possible. You will also use a device or do breathing exercises to keep your lungs clear.  Do not drive for 24 hours if you were given a sedative. Summary  A laparoscopically assisted vaginal hysterectomy (LAVH) is a surgical procedure to remove the uterus and cervix, and sometimes the ovaries and fallopian tubes.  Follow instructions from your health care provider about eating and drinking before the procedure.  During an LAVH, some of the surgical removal is done through the vagina, and the rest is done through a few small incisions in the abdomen. This information is not  intended to replace advice given to you by your health care provider. Make sure you discuss any questions you have with your health care provider. Document Released: 08/14/2011 Document Revised: 10/18/2018 Document Reviewed: 11/20/2016 Elsevier Patient Education  [place].

## 2019-08-15 ENCOUNTER — Encounter: Payer: Self-pay | Admitting: Family Medicine

## 2019-08-15 ENCOUNTER — Other Ambulatory Visit: Payer: Self-pay

## 2019-08-15 ENCOUNTER — Ambulatory Visit (INDEPENDENT_AMBULATORY_CARE_PROVIDER_SITE_OTHER): Payer: Managed Care, Other (non HMO) | Admitting: Family Medicine

## 2019-08-15 VITALS — BP 110/72 | HR 67 | Temp 98.6°F | Ht 60.0 in | Wt 110.0 lb

## 2019-08-15 DIAGNOSIS — F325 Major depressive disorder, single episode, in full remission: Secondary | ICD-10-CM

## 2019-08-15 DIAGNOSIS — Z0000 Encounter for general adult medical examination without abnormal findings: Secondary | ICD-10-CM

## 2019-08-15 DIAGNOSIS — E785 Hyperlipidemia, unspecified: Secondary | ICD-10-CM

## 2019-08-15 DIAGNOSIS — E139 Other specified diabetes mellitus without complications: Secondary | ICD-10-CM | POA: Diagnosis not present

## 2019-08-15 LAB — LIPID PANEL
Cholesterol: 144 mg/dL (ref 0–200)
HDL: 79.6 mg/dL (ref >39.00)
LDL Cholesterol: 59 mg/dL (ref 0–99)
NonHDL: 64.28
Total CHOL/HDL Ratio: 2
Triglycerides: 27 mg/dL (ref 0.0–149.0)
VLDL: 5.4 mg/dL (ref 0.0–40.0)

## 2019-08-15 LAB — CBC WITH DIFFERENTIAL/PLATELET
Basophils Absolute: 0 10*3/uL (ref 0.0–0.1)
Basophils Relative: 0.5 % (ref 0.0–3.0)
Eosinophils Absolute: 0 10*3/uL (ref 0.0–0.7)
Eosinophils Relative: 0.2 % (ref 0.0–5.0)
HCT: 39.3 % (ref 36.0–46.0)
Hemoglobin: 13.5 g/dL (ref 12.0–15.0)
Lymphocytes Relative: 26.3 % (ref 12.0–46.0)
Lymphs Abs: 1 10*3/uL (ref 0.7–4.0)
MCHC: 34.4 g/dL (ref 30.0–36.0)
MCV: 92.2 fl (ref 78.0–100.0)
Monocytes Absolute: 0.3 10*3/uL (ref 0.1–1.0)
Monocytes Relative: 9.3 % (ref 3.0–12.0)
Neutro Abs: 2.4 10*3/uL (ref 1.4–7.7)
Neutrophils Relative %: 63.7 % (ref 43.0–77.0)
Platelets: 147 10*3/uL — ABNORMAL LOW (ref 150.0–400.0)
RBC: 4.26 Mil/uL (ref 3.87–5.11)
RDW: 12.9 % (ref 11.5–15.5)
WBC: 3.7 10*3/uL — ABNORMAL LOW (ref 4.0–10.5)

## 2019-08-15 LAB — COMPREHENSIVE METABOLIC PANEL
ALT: 12 U/L (ref 0–35)
AST: 16 U/L (ref 0–37)
Albumin: 4.6 g/dL (ref 3.5–5.2)
Alkaline Phosphatase: 63 U/L (ref 39–117)
BUN: 8 mg/dL (ref 6–23)
CO2: 30 mEq/L (ref 19–32)
Calcium: 9.2 mg/dL (ref 8.4–10.5)
Chloride: 92 mEq/L — ABNORMAL LOW (ref 96–112)
Creatinine, Ser: 0.48 mg/dL (ref 0.40–1.20)
GFR: 132.25 mL/min (ref >60.00)
Glucose, Bld: 142 mg/dL — ABNORMAL HIGH (ref 70–99)
Potassium: 4.5 mEq/L (ref 3.5–5.1)
Sodium: 126 mEq/L — ABNORMAL LOW (ref 135–145)
Total Bilirubin: 0.6 mg/dL (ref 0.2–1.2)
Total Protein: 6.6 g/dL (ref 6.0–8.3)

## 2019-08-15 LAB — MICROALBUMIN / CREATININE URINE RATIO
Creatinine,U: 13.9 mg/dL
Microalb Creat Ratio: 5 mg/g (ref 0.0–30.0)
Microalb, Ur: <0.7 mg/dL (ref 0.0–1.9)

## 2019-08-15 NOTE — Progress Notes (Signed)
Phone: [phone]   Subjective:  Patient presents today for their annual physical. Chief complaint-noted.   See problem oriented charting- Review of Systems  Constitutional: Negative.   HENT: Negative.   Eyes: Negative.   Respiratory: Negative.   Cardiovascular: Negative.   Gastrointestinal: Negative.   Genitourinary: Negative.        Followed for Bladder prolapse    Musculoskeletal: Negative.   Skin: Negative.   Neurological: Negative.   Endo/Heme/Allergies: Negative.     The following were reviewed and entered/updated in epic: Past Medical History:  Diagnosis Date  . Chicken pox   . Depression    Resolved. in 29s- meds and counseling.  . Family history of hemochromatosis    father- screen iron  . Gestational diabetes 1987   a1c q3 years  . Hammer toe   . Morton's neuroma    per records   Patient Active Problem List   Diagnosis Date Noted  . LADA (latent autoimmune diabetes of adulthood) ([place]) 11/23/2018    Priority: High  . Hyperlipidemia 08/04/2016    Priority: Medium  . Fever blister 08/04/2016    Priority: Low  . Depression     Priority: Low  . Family history of hemochromatosis     Priority: Low   Past Surgical History:  Procedure Laterality Date  . ENDOMETRIAL ABLATION  2010   heavy menstrual bleeding  . TONSILLECTOMY AND ADENOIDECTOMY  1965    Family History  Problem Relation Age of Onset  . Hemochromatosis Father   . Alcoholism Father   . Hearing loss Brother        otosclerosis  . Hypertension Brother   . Hyperlipidemia Brother   . Stroke Other        grandparent  . Hypertension Other        grandparent  . Osteoporosis Mother   . Depression Mother        dealing with some memory issues  . Glaucoma Mother   . Diabetes Brother        ? PE as well- she will verify  . Hyperlipidemia Brother     Medications- reviewed and updated Current Outpatient Medications  Medication Sig Dispense Refill  . acyclovir ointment (ZOVIRAX) 5 %  Apply 1 application topically every 3 (three) hours. 30 g 1  . BAQSIMI TWO PACK 3 MG/DOSE POWD PLACE 3 MG INTO THE NOSE ONCE FOR 1 DOSE.    Marland Kitchen BD PEN NEEDLE NANO 2ND GEN 32G X 4 MM MISC USE 3 TIMES A DAY WITH INSULIN 100 each 3  . Cholecalciferol (D3 HIGH POTENCY) 2000 units CAPS Take 1 capsule by mouth daily.    . Continuous Blood Gluc Sensor (FREESTYLE LIBRE 14 DAY SENSOR) MISC 1 each by Does not apply route every 14 (fourteen) days. Change every 2 weeks 2 each 11  . glucose blood test strip Use to test blood sugar daily 100 each 3  . insulin lispro (HUMALOG KWIKPEN) 100 UNIT/ML KwikPen Inject 0.02-0.04 mLs (2-4 Units total) into the skin 3 (three) times daily with meals. (Patient taking differently: Inject 2-4 Units into the skin 3 (three) times daily with meals. 2-5 units 15 minutes before meals.) 15 mL 11  . Lancets (ACCU-CHEK SOFT TOUCH) lancets Use to test blood sugar daily 100 each 3  . vitamin B-12 (CYANOCOBALAMIN) 250 MCG tablet Take 250 mcg by mouth daily.     No current facility-administered medications for this visit.     Allergies-reviewed and updated Allergies  Allergen Reactions  .  Sulfa Antibiotics   . Penicillins Rash    Social History   Social History Narrative   Married. (husband [name] of Dr. Yong Channel). 2 children grown and married. Will be grandmother December 2016.    Has had 12 moves in 32 years, most recently [place] for 1.5 years, [place] 5 years prior, [place] prior      B.A. In nutrition. Masters in [employer]. In between jobs as Licensed conveyancer.       Objective  Objective:  BP 100/60   Pulse 67   Temp 98.6 F (37 C) (Temporal)   Ht 5' (1.524 m)   Wt 110 lb (49.9 kg)   LMP 09/09/2007 (Approximate) Comment: this was with her endometrial ablation  SpO2 99%   BMI 21.48 kg/m  Gen: NAD, resting comfortably HEENT: Mask not removed due to covid 19. TM normal. Bridge of nose normal. Eyelids normal.  Neck: no thyromegaly or cervical  lymphadenopathy  CV: RRR no murmurs rubs or gallops Lungs: CTAB no crackles, wheeze, rhonchi Abdomen: soft/nontender/nondistended/normal bowel sounds. No rebound or guarding.  Ext: no edema Skin: warm, dry Neuro: grossly normal, moves all extremities, PERRLA  Diabetic Foot Exam - Simple   Simple Foot Form Diabetic Foot exam was performed with the following findings: Yes 08/15/2019  2:08 PM  Visual Inspection No deformities, no ulcerations, no other skin breakdown bilaterally: Yes Sensation Testing Intact to touch and monofilament testing bilaterally: Yes Pulse Check Posterior Tibialis and Dorsalis pulse intact bilaterally: Yes Comments       Assessment and Plan   58 y.o. female presenting for annual physical.  Health Maintenance counseling: 1. Anticipatory guidance: Patient counseled regarding regular dental exams q6 months, eye exams due for it but was not able to get during Covid, will make appointment at first of the year. Avoiding smoking and second hand smoke , limiting alcohol to 1 beverage per day .   2. Risk factor reduction:  Advised patient of need for regular exercise and diet rich and fruits and vegetables to reduce risk of heart attack and stroke. Exercise- Daily 30-60 minutes daily. Walk Bike and strength training rotation. Diet-Whole food plant base diet.   Wt Readings from Last 3 Encounters:  08/15/19 110 lb (49.9 kg)  07/04/19 110 lb (49.9 kg)  06/20/19 111 lb (50.3 kg)  3. Immunizations/screenings/ancillary studies- up to date. Going to get her eye exam in January or February.  Immunization History  Administered Date(s) Administered  . Influenza,inj,Quad PF,6+ Mos 07/31/2015, 08/04/2016, 08/05/2017, 08/11/2018, 06/20/2019  . Pneumococcal Polysaccharide-23 03/09/2018  . Tdap 07/31/2015  . Zoster Recombinat (Shingrix) 03/09/2018, 08/11/2018   4. Cervical cancer screening- last exam 2019.  5. Breast cancer screening-  breast exam monthly and mammogram does not  want to have every year last one 08/11/2018 normal- she prefers every other year. 6. Colon cancer screening - next due 10/10/2020. Normal 2012, may want to consider colguard 7. Skin cancer screening- Dr. Ronnald Ramp every few years or prn. advised regular sunscreen use. Denies worrisome, changing, or new skin lesions.  8. Birth control/STD check- monogamous/postmenopausal 9. Osteoporosis screening at 51- will plan on this at 17 per her request -never smoker  Status of chronic or acute concerns   Patient is considering bladder prolapse surgery-prolapse is worsening and she is using pessary. She may also need some work on rectum and would need hysterectomy. 1 overnight and would have to limit lifting to 10 lbs.    Major depressive disorder with single episode, in full remission ([place]) -Remains  in full remission without medication with PHQ-9 of 1  Hyperlipidemia, unspecified hyperlipidemia type - Plan: Comprehensive metabolic panel, CBC with Differential/Platelet, Lipid panel -we discussed newer guidelines- patient's diet is far superior to most diabetic patients and it is difficult to extrapolate from data if she would get similar CV risk reduction with statin with how good her levels look- she is going to chat with Dr. Cruzita Lederer at next visit.  Lab Results  Component Value Date   CHOL 149 08/04/2018   HDL 71.70 08/04/2018   LDLCALC 69 08/04/2018   TRIG 38.0 08/04/2018   CHOLHDL 2 08/04/2018   LADA (latent autoimmune diabetes of adulthood)- continues to follow with Dr. Cruzita Lederer. Insulin dependent. Well controlled.   Recommended follow up: Return in about 1 year (around 08/14/2020) for CPE. Future Appointments  Date Time Provider [place]  10/20/2019  9:00 AM Philemon Kingdom, MD [id number] None     Lab/Order associations: Not  fasting   ICD-10-CM   1. Major depressive disorder with single episode, in full remission ([place])  F32.5   2. Preventative health care  Z00.00 Comprehensive  metabolic panel    CBC with Differential/Platelet    Lipid panel  3. Hyperlipidemia, unspecified hyperlipidemia type  E78.5 Comprehensive metabolic panel    CBC with Differential/Platelet    Lipid panel  4. LADA (latent autoimmune diabetes of adulthood) ([place])  E13.9 Comprehensive metabolic panel    CBC with Differential/Platelet    Lipid panel    No orders of the defined types were placed in this encounter.   Return precautions advised.  Garret Reddish, MD

## 2019-08-15 NOTE — Addendum Note (Signed)
Addended by: Tomi Likens on: 08/15/2019 02:23 PM   Modules accepted: Orders

## 2019-08-15 NOTE — Patient Instructions (Addendum)
Don't forget to pick up D3 soon to start back on   Check this web site for discount information on your Free Style  https://www.BuyingShow.es  Please stop by lab before you go- can wait in room as long as you need until you feel you can urinate If you do not have mychart- we will call you about results within 5 business days of [place] receiving them.  If you have mychart- we will send your results within 3 business days of [place] receiving them.  If abnormal or we want to clarify a result, we will call or mychart you to make sure you receive the message.  If you have questions or concerns or don't hear within 5-7 days, please send [place] a message or call us.   Health Maintenance Due  Topic Date Due  . OPHTHALMOLOGY EXAM will call for appointment  03/31/2019  . URINE MICROALBUMIN - today 08/05/2019   Recommended follow up: Return in about 1 year (around 08/14/2020) for CPE.

## 2019-08-16 ENCOUNTER — Other Ambulatory Visit: Payer: Self-pay

## 2019-08-16 DIAGNOSIS — E871 Hypo-osmolality and hyponatremia: Secondary | ICD-10-CM

## 2019-08-26 ENCOUNTER — Other Ambulatory Visit: Payer: Self-pay

## 2019-08-29 ENCOUNTER — Other Ambulatory Visit: Payer: Self-pay

## 2019-08-29 ENCOUNTER — Other Ambulatory Visit (INDEPENDENT_AMBULATORY_CARE_PROVIDER_SITE_OTHER): Payer: Managed Care, Other (non HMO)

## 2019-08-29 DIAGNOSIS — E871 Hypo-osmolality and hyponatremia: Secondary | ICD-10-CM | POA: Diagnosis not present

## 2019-08-29 LAB — BASIC METABOLIC PANEL
BUN: 6 mg/dL (ref 6–23)
CO2: 26 mEq/L (ref 19–32)
Calcium: 8.5 mg/dL (ref 8.4–10.5)
Chloride: 92 mEq/L — ABNORMAL LOW (ref 96–112)
Creatinine, Ser: 0.52 mg/dL (ref 0.40–1.20)
GFR: 120.56 mL/min (ref >60.00)
Glucose, Bld: 149 mg/dL — ABNORMAL HIGH (ref 70–99)
Potassium: 4 mEq/L (ref 3.5–5.1)
Sodium: 126 mEq/L — ABNORMAL LOW (ref 135–145)

## 2019-08-29 NOTE — Addendum Note (Signed)
Addended by: Marin Olp on: 08/29/2019 08:55 PM   Modules accepted: Orders

## 2019-08-29 NOTE — Progress Notes (Signed)
Your sodium remains significantly low.   Team please have patient come by for urine tests that I ordered today-need to evaluate urine sodium and urine osmolality for more informatoin

## 2019-08-30 NOTE — Progress Notes (Signed)
Left message to return call to our office.  

## 2019-08-31 ENCOUNTER — Telehealth: Payer: Self-pay | Admitting: Family Medicine

## 2019-08-31 NOTE — Telephone Encounter (Signed)
See note  Copied from [place] [id number]. Topic: Quick Sport and exercise psychologist Patient (Clinic Use ONLY) >> Aug 31, 2019  2:30 PM Lennox Solders wrote: Reason for CRM:pt is returning joellen call

## 2019-09-01 NOTE — Telephone Encounter (Signed)
Patient called back to let us know she has not came in for additional urine test that were ordered because she has been exposed to covid from son. She has been tested and will call for appointment after negative results have been received.

## 2019-09-01 NOTE — Telephone Encounter (Signed)
Perfect thanks

## 2019-10-18 ENCOUNTER — Other Ambulatory Visit: Payer: Self-pay

## 2019-10-20 ENCOUNTER — Other Ambulatory Visit: Payer: Self-pay

## 2019-10-20 ENCOUNTER — Encounter: Payer: Self-pay | Admitting: Internal Medicine

## 2019-10-20 ENCOUNTER — Ambulatory Visit (INDEPENDENT_AMBULATORY_CARE_PROVIDER_SITE_OTHER): Payer: Managed Care, Other (non HMO) | Admitting: Internal Medicine

## 2019-10-20 VITALS — BP 100/70 | HR 65 | Ht 60.25 in | Wt 115.0 lb

## 2019-10-20 DIAGNOSIS — E139 Other specified diabetes mellitus without complications: Secondary | ICD-10-CM

## 2019-10-20 DIAGNOSIS — E785 Hyperlipidemia, unspecified: Secondary | ICD-10-CM

## 2019-10-20 LAB — POCT GLYCOSYLATED HEMOGLOBIN (HGB A1C): Hemoglobin A1C: 5.7 % — AB (ref 4.0–5.6)

## 2019-10-20 MED ORDER — BASAGLAR KWIKPEN 100 UNIT/ML ~~LOC~~ SOPN: 4.0000 [IU] | PEN_INJECTOR | Freq: Every day | SUBCUTANEOUS | 3 refills | Status: DC

## 2019-10-20 MED ORDER — BD PEN NEEDLE NANO 2ND GEN 32G X 4 MM MISC: 3 refills | Status: DC

## 2019-10-20 MED ORDER — FREESTYLE LIBRE 2 READER DEVI: 1.0000 | Freq: Every day | 0 refills | Status: DC

## 2019-10-20 MED ORDER — FREESTYLE LIBRE 2 SENSOR MISC: 1.0000 | 3 refills | Status: DC

## 2019-10-20 MED ORDER — INSULIN LISPRO (1 UNIT DIAL) 100 UNIT/ML (KWIKPEN): 6.0000 [IU] | PEN_INJECTOR | Freq: Three times a day (TID) | SUBCUTANEOUS | 3 refills | Status: DC

## 2019-10-20 NOTE — Patient Instructions (Addendum)
Please start: - Basaglar 4-6 units at bedtime  Please decrease: - Humalog 4-6 units 15 min before the 3 meals  Please return in 4 months.

## 2019-10-20 NOTE — Addendum Note (Signed)
Addended by: Cardell Peach I on: 10/20/2019 10:18 AM   Modules accepted: Orders

## 2019-10-20 NOTE — Progress Notes (Signed)
Patient ID: Brittany Barton, female   DOB: 1961/03/01, 59 y.o.   MRN: [id number]   This visit occurred during the SARS-CoV-2 public health emergency.  Safety protocols were in place, including screening questions prior to the visit, additional usage of staff PPE, and extensive cleaning of exam room while observing appropriate contact time as indicated for disinfecting solutions.   HPI: Brittany Barton is a 59 y.o.-year-old female, initially referred by her PCP, Dr. 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  Last visit 4 months ago.  Reviewed HbA1c levels: Lab Results  Component Value Date   HGBA1C 5.6 06/20/2019   HGBA1C 7.3 (A) 03/17/2019   HGBA1C 6.6 (H) 11/08/2018   HGBA1C 6.0 08/04/2018   HGBA1C 5.4 03/09/2018   HGBA1C 7.6 12/03/2017   HGBA1C 6.6 (H) 08/06/2017   HGBA1C 5.9 08/04/2016   HGBA1C 5.7 07/31/2015   HGBA1C 5.6 05/24/2013   She is on: - Novolog 4-10 units before meals -she is using higher doses since last visit  She has a history of high potassium and low sodium: Component     Latest Ref Rng & Units 11/08/2018  Sodium     135 - 145 mEq/L 133 (L)  Potassium     3.5 - 5.1 mEq/L 5.7 (H)   We ruled out adrenal insufficiency by a cosyntropin stimulation test: Component     Latest Ref Rng & Units 11/30/2018 11/30/2018 11/30/2018         8:15 AM  8:59 AM  9:26 AM  Cortisol, Plasma     ug/dL 16.8 24.2 28.0  C206 ACTH     6 - 50 pg/mL 15     She continues on a plant-based diet without high glycemic index foods.. In 02/2015 she started a plant-based diet and lost more than 80 pounds.    She checks her sugars more than 4 times a day with her freestyle libre CGM:  Freestyle libre CGM parameters: - Average: 168 >> 129 - % active CGM time: 92% >> 20% of the time - Glucose variability 23.3% >> 25.2% (target < or = to 36%) - time in range:  -  very low (<54): 0% >> 0% - low (54-69): 0% >> 3% - normal range (70-180): 65% >> 90% - high sugars (181-250): 33% >> 7% - very high sugars (>250): 2% >> 0%    Previously:   Lowest sugar was 65 >> 60s x1 >> 40 >> 47; she has hypoglycemia awareness at 70.  Highest sugar was 166 >> 280 >> 230 >> 200s.  Glucometer: Accuchek  Pt's meals are: - Breakfast: Raw/soaked oatmeal, blueberries or other fruit, ground flaxseed, walnuts (2 tablespoons), soy milk - Lunch: Beans, whole grains, leafy greens, other vegetables and fruit - Dinner: Salad with beans/peas or vegetable-based soup. - Snacks: 0-2-fruit, veggies, homemade bean dip  -No CKD, last BUN/creatinine:  Lab Results  Component Value Date   BUN 6 08/29/2019   BUN 8 08/15/2019   CREATININE 0.52 08/29/2019   CREATININE 0.48 08/15/2019  Not on an ACE inhibitor/ARB  -+ History of HL, diet controlled; last set of lipids: Lab Results  Component Value Date   CHOL 144 08/15/2019   HDL 79.60 08/15/2019   LDLCALC 59 08/15/2019   TRIG 27.0 08/15/2019   CHOLHDL 2 08/15/2019  She is not on a statin.  - last eye exam was in 03/2018: No DR  -  no numbness and tingling in her feet.  Pt has FH of DM in MGM.  Brother with otosclerosis.  ROS: Constitutional: +5 pound weight gain/no weight loss, no fatigue, no subjective hyperthermia, no subjective hypothermia Eyes: no blurry vision, no xerophthalmia ENT: no sore throat, no nodules palpated in neck, no dysphagia, no odynophagia, no hoarseness Cardiovascular: no CP/no SOB/no palpitations/no leg swelling Respiratory: no cough/no SOB/no wheezing Gastrointestinal: no N/no V/no D/no C/no acid reflux Musculoskeletal: no muscle aches/no joint aches Skin: no rashes, no hair loss Neurological: no tremors/no numbness/no tingling/no dizziness  I reviewed pt's medications, allergies, PMH, social hx, family hx, and changes were documented in the history of present illness. Otherwise, unchanged  from my initial visit note.  Past Medical History:  Diagnosis Date  . Chicken pox   . Depression    Resolved. in 60s- meds and counseling.  . Family history of hemochromatosis    father- screen iron  . Gestational diabetes 1987   a1c q3 years  . Hammer toe   . Morton's neuroma    per records   Past Surgical History:  Procedure Laterality Date  . ENDOMETRIAL ABLATION  2010   heavy menstrual bleeding  . TONSILLECTOMY AND ADENOIDECTOMY  1965   Social History   Socioeconomic History  . Marital status: Married    Spouse name: Not on file  . Number of children: 2  . Years of education: Not on file  . Highest education level: Not on file  Occupational History  .  Librarian  Social Needs  . Financial resource strain: Not on file  . Food insecurity:    Worry: Not on file    Inability: Not on file  . Transportation needs:    Medical: Not on file    Non-medical: Not on file  Tobacco Use  . Smoking status: Never Smoker  . Smokeless tobacco: Never Used  Substance and Sexual Activity  . Alcohol use: Yes    Alcohol/week:  2-4 drinks per week    Types:  Wine/beer  . Drug use: No  . Sexual activity: Yes  Lifestyle  . Physical activity: Walk/hike/spin class/weight resistance/stretching    Days per week: 7    Minutes per session: Not on file  Relationships  Social History Narrative   Married. (husband [name] of Dr. Yong Channel). 2 children grown and married. Will be grandmother December 2016.    Has had 12 moves in 32 years, most recently [place] for 1.5 years, [place] 5 years prior, [place] prior      B.A. In nutrition. Masters in [employer]. In between jobs as Licensed conveyancer.       Current Outpatient Medications on File Prior to Visit  Medication Sig Dispense Refill  . acyclovir ointment (ZOVIRAX) 5 % Apply 1 application topically every 3 (three) hours. 30 g 1  . BAQSIMI TWO PACK 3 MG/DOSE POWD PLACE 3 MG INTO THE NOSE ONCE FOR 1 DOSE.    Marland Kitchen BD PEN NEEDLE NANO  2ND GEN 32G X 4 MM MISC USE 3 TIMES A DAY WITH INSULIN 100 each 3  . Cholecalciferol (D3 HIGH POTENCY) 2000 units CAPS Take 1 capsule by mouth daily.    . Continuous Blood Gluc Sensor (FREESTYLE LIBRE 14 DAY SENSOR) MISC 1 each by Does not apply route every 14 (fourteen) days. Change every 2 weeks 2 each 11  . glucose blood test strip Use to test blood sugar daily 100 each 3  . insulin lispro (HUMALOG KWIKPEN) 100 UNIT/ML KwikPen Inject 0.02-0.04  mLs (2-4 Units total) into the skin 3 (three) times daily with meals. (Patient taking differently: Inject 2-4 Units into the skin 3 (three) times daily with meals. 2-5 units 15 minutes before meals.) 15 mL 11  . Lancets (ACCU-CHEK SOFT TOUCH) lancets Use to test blood sugar daily 100 each 3  . vitamin B-12 (CYANOCOBALAMIN) 250 MCG tablet Take 250 mcg by mouth daily.     No current facility-administered medications on file prior to visit.   Allergies  Allergen Reactions  . Sulfa Antibiotics   . Penicillins Rash   Family History  Problem Relation Age of Onset  . Hemochromatosis Father   . Alcoholism Father   . Hearing loss Brother        otosclerosis  . Hypertension Brother   . Hyperlipidemia Brother   . Stroke Other        grandparent  . Hypertension Other        grandparent  . Osteoporosis Mother   . Depression Mother        dealing with some memory issues  . Glaucoma Mother   . Diabetes Brother        ? PE as well- she will verify  . Hyperlipidemia Brother    PE: BP 100/70   Pulse 65   Ht 5' 0.25" (1.53 m)   Wt 115 lb (52.2 kg)   LMP 09/09/2007 (Approximate) Comment: this was with her endometrial ablation  SpO2 99%   BMI 22.27 kg/m  Wt Readings from Last 3 Encounters:  10/20/19 115 lb (52.2 kg)  08/15/19 110 lb (49.9 kg)  07/04/19 110 lb (49.9 kg)   Constitutional: normal weight, in NAD Eyes: PERRLA, EOMI, no exophthalmos ENT: moist mucous membranes, no thyromegaly, no cervical lymphadenopathy Cardiovascular: RRR, No  MRG Respiratory: CTA B Gastrointestinal: abdomen soft, NT, ND, BS+ Musculoskeletal: no deformities, strength intact in all 4 Skin: moist, warm, no rashes Neurological: no tremor with outstretched hands, DTR normal in all 4  ASSESSMENT: 1. LADA, now insulin-dependent, controlled, without long-term complications but with occasional hyperglycemia  She had low C-peptide and high GAD antibodies: Component     Latest Ref Rng & Units 03/17/2019  Glucose     65 - 99 mg/dL 192 (H)  C-Peptide     0.80 - 3.85 ng/mL 0.55 (L)   Component     Latest Ref Rng & Units 11/08/2018  Glucose     70 - 99 mg/dL 130 (H)  C-Peptide     0.80 - 3.85 ng/mL 0.55 (L)  Glutamic Acid Decarb Ab     <5 IU/mL >250 (H)  Insulin Antibodies, Human     <0.4 U/mL 3.6 (H)   Component     Latest Ref Rng & Units 11/30/2018  ZNT8 Antibodies     U/mL <15  Islet Cell Ab     Neg:<1:1 Negative   2. HL  PLAN:  1. Patient with several years of controlled diabetes, on a plant-based diet.  She had positive GAD antibodies, qualifying her for a diagnosis of LADA.  She keeps a strict exercise routine, exercising every day and controlling her diet.  Therefore, she could avoid starting insulin for longer periods of time but we ended up adding rapid acting insulin in 2020. She appears not to require long-acting insulin yet. -At this visit, the majority of the blood sugars are at goal per review of her CGM downloads, which will be scanned.  She is 90% in the normal range with an average glucose  of 129.  This is excellent. -She does a great job adjusting her insulin doses based on the number of carbs she eats, however, she feels that she had to increase her doses of insulin recently and she is frustrated that her sugars are increasing from approximately 3 AM to 6 AM, even before she gets out of bed.  Therefore, we discussed about adding a low-dose long-acting insulin while trying to decrease the doses of her Humalog.  It appears that  Nancee Liter is covered for her so we will try to send this.  I advised her to take it at bedtime.   -At this visit, I suggested the freestyle libre 2 CGM, since this has alarms.  If this is not covered, she will let me know so I can refill the sensors for the freestyle libre 14 days. - I advised her to: Patient Instructions  Please start: - Basaglar 4-6 units at bedtime  Please decrease: - Humalog 4-6 units 15 min before the 3 meals  Please return in 4 months.  - we checked her HbA1c: 5.7% (slightly higher) - advised to check sugars at different times of the day - 4x a day, rotating check times - advised for yearly eye exams >> she is not UTD - return to clinic in 4 months  2. HL -Reviewed latest lipid panel from 08/2019: All fractions at goal Lab Results  Component Value Date   CHOL 144 08/15/2019   HDL 79.60 08/15/2019   LDLCALC 59 08/15/2019   TRIG 27.0 08/15/2019   CHOLHDL 2 08/15/2019  -She is not on a statin -Continues on the plant-based diet, which helped tremendously being her cholesterol levels  Philemon Kingdom, MD PhD [facility]

## 2019-11-04 ENCOUNTER — Telehealth: Payer: Self-pay

## 2019-11-04 NOTE — Telephone Encounter (Signed)
Left message to call Janayah Zavada at [phone]. 

## 2019-11-07 ENCOUNTER — Telehealth: Payer: Self-pay | Admitting: Obstetrics and Gynecology

## 2019-11-07 NOTE — Telephone Encounter (Signed)
Routing to Dr. Antony Blackbird.   Cc: Reesa Chew, RN

## 2019-11-07 NOTE — Telephone Encounter (Signed)
Patient has decided not to have a surgery at this time. She is busy taking care of her parents and will call to schedule surgery when the time is right. No need to call patient unless you have questions.

## 2019-11-07 NOTE — Telephone Encounter (Signed)
Encounter reviewed and closed.   Cc- Kaitlyn Sprague

## 2019-11-18 NOTE — Telephone Encounter (Signed)
Please see telephone encounter dated 11/07/2019. Encounter closed.

## 2020-02-03 ENCOUNTER — Telehealth: Payer: Self-pay | Admitting: Internal Medicine

## 2020-02-03 NOTE — Telephone Encounter (Signed)
Patient asked if we would be sending the link to her before her appointment so that she can upload her Free Style before her visit

## 2020-02-03 NOTE — Telephone Encounter (Signed)
I do not completely understand what she is asking.  Patient is scheduled for in person visit and the Elenor Legato is already linked, she just needs to upload it.  I have sent her a MyChart message to clarify.

## 2020-02-08 ENCOUNTER — Ambulatory Visit (INDEPENDENT_AMBULATORY_CARE_PROVIDER_SITE_OTHER): Payer: Managed Care, Other (non HMO) | Admitting: Internal Medicine

## 2020-02-08 ENCOUNTER — Encounter: Payer: Self-pay | Admitting: Internal Medicine

## 2020-02-08 ENCOUNTER — Other Ambulatory Visit: Payer: Self-pay

## 2020-02-08 VITALS — BP 102/60 | HR 78 | Ht 60.25 in | Wt 118.0 lb

## 2020-02-08 DIAGNOSIS — E139 Other specified diabetes mellitus without complications: Secondary | ICD-10-CM | POA: Diagnosis not present

## 2020-02-08 DIAGNOSIS — E785 Hyperlipidemia, unspecified: Secondary | ICD-10-CM | POA: Diagnosis not present

## 2020-02-08 LAB — POCT GLYCOSYLATED HEMOGLOBIN (HGB A1C): Hemoglobin A1C: 5.7 % — AB (ref 4.0–5.6)

## 2020-02-08 NOTE — Patient Instructions (Addendum)
Please continue: - Basaglar 2-3 units at bedtime  Please change: - Humalog insulin to carb ratio 1:20 (may try 1:18). If you are exercising after the meal, try 1:30 for hat meal.  Please return in 4 months.

## 2020-02-08 NOTE — Progress Notes (Signed)
Patient ID: Brittany Barton, female   DOB: 09/10/1960, 59 y.o.   MRN: [id number]   This visit occurred during the SARS-CoV-2 public health emergency.  Safety protocols were in place, including screening questions prior to the visit, additional usage of staff PPE, and extensive cleaning of exam room while observing appropriate contact time as indicated for disinfecting solutions.   HPI: Brittany Barton is a 59 y.o.-year-old female, initially referred by her PCP, Dr. 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  Last visit 4 months ago.  Reviewed HbA1c levels: Lab Results  Component Value Date   HGBA1C 5.7 (A) 10/20/2019   HGBA1C 5.6 06/20/2019   HGBA1C 7.3 (A) 03/17/2019   HGBA1C 6.6 (H) 11/08/2018   HGBA1C 6.0 08/04/2018   HGBA1C 5.4 03/09/2018   HGBA1C 7.6 12/03/2017   HGBA1C 6.6 (H) 08/06/2017   HGBA1C 5.9 08/04/2016   HGBA1C 5.7 07/31/2015   She is on: - Humalog 4-10 >> 4-6 units before meals  - Basaglar 4-6 >> 2-3 units daily at bedtime - added 10/2019  She checks her sugars more than 4 times a day with her freestyle libre CGM.  Freestyle libre CGM parameters: - Average: 168 >> 129 - % active CGM time: 92% >> 20% >> 100% of the time - Glucose variability 23.3% >> 25.2% >> 27% (target < or = to 36%) - GMI: 6.4% - time in range:  - very low (<54): 0% >> 0% >> 0% - low (54-69): 0% >> 3% >> 1% - normal range (70-180): 65% >> 90% >> 91% - high sugars (181-250): 33% >> 7% >> 7% - very high sugars (>250): 2% >> 0% >> 1%    Previously:   Previously:   Lowest sugar was 65 >> 60s x1 >> 40 >> 47; she has hypoglycemia awareness at 70..  Highest sugar was 166 >> 280 >> 230 >> 200s >> 303.  Glucometer: Accuchek  She continues on a plant-based diet without high glycemic index foods. In 02/2015 she started a plant-based diet and lost more than 80  pounds.  Pt's meals are: - Breakfast: Raw/soaked oatmeal, blueberries or other fruit, ground flaxseed, walnuts (2 tablespoons), soy milk - Lunch: Beans, whole grains, leafy greens, other vegetables and fruit - Dinner: Salad with beans/peas or vegetable-based soup. - Snacks: 0-2-fruit, veggies, homemade bean dip  -No CKD, last BUN/creatinine:  Lab Results  Component Value Date   BUN 6 08/29/2019   BUN 8 08/15/2019   CREATININE 0.52 08/29/2019   CREATININE 0.48 08/15/2019  Not on ACE inhibitor/ARB.  - + h/o HL, diet controlled; last set of lipids: Lab Results  Component Value Date   CHOL 144 08/15/2019   HDL 79.60 08/15/2019   LDLCALC 59 08/15/2019   TRIG 27.0 08/15/2019   CHOLHDL 2 08/15/2019  Not on a statin.  - last eye exam was in 03/2018: No DR  - no numbness and tingling in her feet.  Pt has FH of DM in MGM.  She has a history of high potassium and low sodium: Component     Latest Ref Rng & Units 11/08/2018  Sodium     135 - 145 mEq/L 133 (L)  Potassium     3.5 - 5.1 mEq/L 5.7 (H)   We ruled out adrenal insufficiency by a cosyntropin stimulation test: Component     Latest Ref Rng & Units 11/30/2018 11/30/2018 11/30/2018  8:15 AM  8:59 AM  9:26 AM  Cortisol, Plasma     ug/dL 16.8 24.2 28.0  C206 ACTH     6 - 50 pg/mL 15     Brother with otosclerosis.  ROS: Constitutional: + weight gain (3 pounds) /no weight loss, no fatigue, no subjective hyperthermia, no subjective hypothermia Eyes: no blurry vision, no xerophthalmia ENT: no sore throat, no nodules palpated in neck, no dysphagia, no odynophagia, no hoarseness Cardiovascular: no CP/no SOB/no palpitations/no leg swelling Respiratory: no cough/no SOB/no wheezing Gastrointestinal: no N/no V/no D/no C/no acid reflux Musculoskeletal: no muscle aches/no joint aches Skin: no rashes, no hair loss Neurological: no tremors/no numbness/no tingling/no dizziness  I reviewed pt's medications, allergies, PMH,  social hx, family hx, and changes were documented in the history of present illness. Otherwise, unchanged from my initial visit note.  Past Medical History:  Diagnosis Date  . Chicken pox   . Depression    Resolved. in 66s- meds and counseling.  . Family history of hemochromatosis    father- screen iron  . Gestational diabetes 1987   a1c q3 years  . Hammer toe   . Morton's neuroma    per records   Past Surgical History:  Procedure Laterality Date  . ENDOMETRIAL ABLATION  2010   heavy menstrual bleeding  . TONSILLECTOMY AND ADENOIDECTOMY  1965   Social History   Socioeconomic History  . Marital status: Married    Spouse name: Not on file  . Number of children: 2  . Years of education: Not on file  . Highest education level: Not on file  Occupational History  .  Librarian  Social Needs  . Financial resource strain: Not on file  . Food insecurity:    Worry: Not on file    Inability: Not on file  . Transportation needs:    Medical: Not on file    Non-medical: Not on file  Tobacco Use  . Smoking status: Never Smoker  . Smokeless tobacco: Never Used  Substance and Sexual Activity  . Alcohol use: Yes    Alcohol/week:  2-4 drinks per week    Types:  Wine/beer  . Drug use: No  . Sexual activity: Yes  Lifestyle  . Physical activity: Walk/hike/spin class/weight resistance/stretching    Days per week: 7    Minutes per session: Not on file  Relationships  Social History Narrative   Married. (husband [name] of Dr. Yong Channel). 2 children grown and married. Will be grandmother December 2016.    Has had 12 moves in 32 years, most recently [place] for 1.5 years, [place] 5 years prior, [place] prior      B.A. In nutrition. Masters in [employer]. In between jobs as Licensed conveyancer.       Current Outpatient Medications on File Prior to Visit  Medication Sig Dispense Refill  . acyclovir ointment (ZOVIRAX) 5 % Apply 1 application topically every 3 (three) hours. 30 g 1   . BAQSIMI TWO PACK 3 MG/DOSE POWD PLACE 3 MG INTO THE NOSE ONCE FOR 1 DOSE.    Marland Kitchen Cholecalciferol (D3 HIGH POTENCY) 2000 units CAPS Take 1 capsule by mouth daily.    . Continuous Blood Gluc Receiver (FREESTYLE LIBRE 2 READER) DEVI 1 each by Does not apply route daily. 1 each 0  . Continuous Blood Gluc Sensor (FREESTYLE LIBRE 14 DAY SENSOR) MISC 1 each by Does not apply route every 14 (fourteen) days. Change every 2 weeks 2 each 11  . Continuous Blood Gluc Sensor (  FREESTYLE LIBRE 2 SENSOR) MISC 1 each by Does not apply route every 14 (fourteen) days. 6 each 3  . glucose blood test strip Use to test blood sugar daily 100 each 3  . Insulin Glargine (BASAGLAR KWIKPEN) 100 UNIT/ML SOPN Inject 0.04-0.06 mLs (4-6 Units total) into the skin daily. 5 pen 3  . insulin lispro (HUMALOG KWIKPEN) 100 UNIT/ML KwikPen Inject 0.06-0.1 mLs (6-10 Units total) into the skin 3 (three) times daily with meals. 15 mL 3  . Insulin Pen Needle (BD PEN NEEDLE NANO 2ND GEN) 32G X 4 MM MISC USE 4 TIMES A DAY WITH INSULIN 200 each 3  . Lancets (ACCU-CHEK SOFT TOUCH) lancets Use to test blood sugar daily 100 each 3  . vitamin B-12 (CYANOCOBALAMIN) 250 MCG tablet Take 250 mcg by mouth daily.     No current facility-administered medications on file prior to visit.   Allergies  Allergen Reactions  . Sulfa Antibiotics   . Penicillins Rash   Family History  Problem Relation Age of Onset  . Hemochromatosis Father   . Alcoholism Father   . Hearing loss Brother        otosclerosis  . Hypertension Brother   . Hyperlipidemia Brother   . Stroke Other        grandparent  . Hypertension Other        grandparent  . Osteoporosis Mother   . Depression Mother        dealing with some memory issues  . Glaucoma Mother   . Diabetes Brother        ? PE as well- she will verify  . Hyperlipidemia Brother    PE: BP 102/60   Pulse 78   Ht 5' 0.25" (1.53 m)   Wt 118 lb (53.5 kg)   LMP 09/09/2007 (Approximate) Comment: this was  with her endometrial ablation  SpO2 98%   BMI 22.85 kg/m  Wt Readings from Last 3 Encounters:  02/08/20 118 lb (53.5 kg)  10/20/19 115 lb (52.2 kg)  08/15/19 110 lb (49.9 kg)   Constitutional: normal weight, in NAD Eyes: PERRLA, EOMI, no exophthalmos ENT: moist mucous membranes, no thyromegaly, no cervical lymphadenopathy Cardiovascular: RRR, No MRG Respiratory: CTA B Gastrointestinal: abdomen soft, NT, ND, BS+ Musculoskeletal: no deformities, strength intact in all 4 Skin: moist, warm, no rashes Neurological: no tremor with outstretched hands, DTR normal in all 4  ASSESSMENT: 1. LADA, insulin-dependent, controlled, without long-term complications but with occasional hyperglycemia  She had low C-peptide and high GAD antibodies: Component     Latest Ref Rng & Units 03/17/2019  Glucose     65 - 99 mg/dL 192 (H)  C-Peptide     0.80 - 3.85 ng/mL 0.55 (L)   Component     Latest Ref Rng & Units 11/08/2018  Glucose     70 - 99 mg/dL 130 (H)  C-Peptide     0.80 - 3.85 ng/mL 0.55 (L)  Glutamic Acid Decarb Ab     <5 IU/mL >250 (H)  Insulin Antibodies, Human     <0.4 U/mL 3.6 (H)   Component     Latest Ref Rng & Units 11/30/2018  ZNT8 Antibodies     U/mL <15  Islet Cell Ab     Neg:<1:1 Negative   2. HL  PLAN:  1. Patient with several years of uncontrolled diabetes, on a plant-based diet.  She had positive GAD antibodies >> dx of LADA.  She keeps a strict exercise routine, exercising every day  and controlling her diet.  Therefore, she avoided started insulin for a long period of time.  We added rapid acting insulin 2020 and long-acting insulin in 10/2019.  At that time, the majority of the blood sugars were at goal per review of her CGM downloads with an average of 129.  Her sugars were increasing from 3 AM to 6 AM so we added Basaglar.  I advised her to use 4 to 6 units, but she is currently only using 2 units and occasionally 3 units says she had low blood sugars when she used a  higher dose.  Her sugars overnight appear to be well controlled, though -At this visit, sugars are mostly in range, with occasional hyperglycemic peaks after lunch and dinner.  Her average glucose for the last 2 months is 128, for a glucose management indicator of 6.4%.  This is excellent.  However, will bring the traces, she does have higher blood sugars occasionally after lunch and also after dinner.  The problem appears to be that she is bolusing too late for these meals.  We discussed about the importance of bolusing 15 minutes before each meal.  At this visit, we also discussed about using an insulin to carb ratio.  As of now, she is writing down how many carbs she eats with each meal (using an app) and calculates an insulin to carb ratio is a function of how much insulin she takes.  She is usually between 1:17 and 1:24.  At this visit, we discussed about maybe staying with a fixed ICR of 1:20  (possibly lower limit starches, if needed in the future) to target easier for her calculate her insulin boluses. -Otherwise, since her sugar are lower if she exercises, we discussed to try a more permissive ICR for the meal before exercise -I also suggested to switch from Humalog to Lyumjev, which has a shorter onset time so she does not need to wait 15 minutes after the bolus, before she can eat.  She still has Humalog at home for now but will let me know when she finishes her supply. -Otherwise, I do not feel we need to change her regimen for now -We also discussed about how to manage lows on the CGM.  I explained that this may appear lower than they actually are, by 20 to 30 mg/dL.  I advised her to always check with her glucometer and the sugars appear low on the CGM.  She is correcting the low blood sugars with 1 large date (18 g of carbs).  Sometimes, her sugars increase too much after this >> advised her to try half a day at first. - I advised her to: Patient Instructions  Please continue: - Basaglar 2-3  units at bedtime  Please change: - Humalog insulin to carb ratio 1:20 (may try 1:18). If you are exercising after the meal, try 1:30 for hat meal.  Please return in 4 months.  - we checked her HbA1c: 5.7% (stable, at goal) - advised to check sugars at different times of the day - 4x a day, rotating check times - advised for yearly eye exams >> she is UTD - return to clinic in 4 months  2. HL -Reviewed latest lipid panel from 08/2019: All fractions at goal Lab Results  Component Value Date   CHOL 144 08/15/2019   HDL 79.60 08/15/2019   LDLCALC 59 08/15/2019   TRIG 27.0 08/15/2019   CHOLHDL 2 08/15/2019  -She is not on a statin, but continues  on a plant-based diet, which helped tremendously with her cholesterol levels  Philemon Kingdom, MD PhD [facility] End[facility]

## 2020-02-28 ENCOUNTER — Encounter: Payer: Self-pay | Admitting: Internal Medicine

## 2020-02-28 LAB — HM DIABETES EYE EXAM

## 2020-02-29 ENCOUNTER — Other Ambulatory Visit: Payer: Self-pay | Admitting: Internal Medicine

## 2020-02-29 MED ORDER — LYUMJEV KWIKPEN 100 UNIT/ML ~~LOC~~ SOPN: PEN_INJECTOR | SUBCUTANEOUS | 3 refills | Status: DC

## 2020-03-02 ENCOUNTER — Other Ambulatory Visit: Payer: Self-pay | Admitting: Internal Medicine

## 2020-03-02 MED ORDER — BD PEN NEEDLE NANO 2ND GEN 32G X 4 MM MISC: 3 refills | Status: DC

## 2020-06-12 ENCOUNTER — Other Ambulatory Visit: Payer: Self-pay

## 2020-06-12 ENCOUNTER — Ambulatory Visit (INDEPENDENT_AMBULATORY_CARE_PROVIDER_SITE_OTHER): Payer: Managed Care, Other (non HMO) | Admitting: Internal Medicine

## 2020-06-12 ENCOUNTER — Encounter: Payer: Self-pay | Admitting: Internal Medicine

## 2020-06-12 VITALS — BP 100/60 | HR 63 | Ht 60.25 in | Wt 116.0 lb

## 2020-06-12 DIAGNOSIS — E139 Other specified diabetes mellitus without complications: Secondary | ICD-10-CM

## 2020-06-12 DIAGNOSIS — E785 Hyperlipidemia, unspecified: Secondary | ICD-10-CM

## 2020-06-12 LAB — POCT GLYCOSYLATED HEMOGLOBIN (HGB A1C): Hemoglobin A1C: 5.8 % — AB (ref 4.0–5.6)

## 2020-06-12 NOTE — Patient Instructions (Signed)
Please continue: - Basaglar 3-4 units at bedtime  Please use: - Humalog insulin to carb ratio 1:16-20.  If you are exercising after the meal, try 1:30 for that meal.  Please return in 4 months.

## 2020-06-12 NOTE — Progress Notes (Signed)
Patient ID: Brittany Barton, female   DOB: 16-Sep-1960, 59 y.o.   MRN: [id number]   This visit occurred during the SARS-CoV-2 public health emergency.  Safety protocols were in place, including screening questions prior to the visit, additional usage of staff PPE, and extensive cleaning of exam room while observing appropriate contact time as indicated for disinfecting solutions.   HPI: Brittany Barton is a 59 y.o.-year-old female, initially referred by her PCP, Dr. 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  Last visit 4 months ago.  Reviewed HbA1c levels: Lab Results  Component Value Date   HGBA1C 5.7 (A) 02/08/2020   HGBA1C 5.7 (A) 10/20/2019   HGBA1C 5.6 06/20/2019   HGBA1C 7.3 (A) 03/17/2019   HGBA1C 6.6 (H) 11/08/2018   HGBA1C 6.0 08/04/2018   HGBA1C 5.4 03/09/2018   HGBA1C 7.6 12/03/2017   HGBA1C 6.6 (H) 08/06/2017   HGBA1C 5.9 08/04/2016   She is on: - Basaglar 4-6 >> 2-3 >> 3 (-4) units daily at bedtime - added 10/2019 - Humalog >> Lyumjev - insulin to carb ratio 1:18-20. If exercising after the meal, try 1:30 for that meal. She loves Lyumjev!  She checks her sugars more than 4 times a day with her freestyle libre CGM  Freestyle libre CGM parameters: - Average: 168 >> 129 >> 125 - % active CGM time: 92% >> 20% >> 100% >> 100% of the time - Glucose variability 23.3% >> 25.2% >> 27% >> 29.8% (target < or = to 36%) - GMI: 6.4% - time in range:  - very low (<54): 0% >> 0% >> 0% >> 0% - low (54-69): 0% >> 3% >> 1% >> 4% - normal range (70-180): 65% >> 90% >> 91% >> 89% - high sugars (181-250): 33% >> 7% >> 7% >> 7% - very high sugars (>250): 2% >> 0% >> 1% >> 0%    Previously:   Previously:   Previously:   Lowest sugar was 65 >> 60s x1 >> 40 >> 47 >> 58; she has hypoglycemia awareness at 70. Highest sugar was 166 >> 280 >> 230 >>  200s >> 303 >> 303.  Glucometer: Accuchek  She continues on a plant-based diet without high glycemic index foods. In 06/Barton she started a plant-based diet and lost more than 80 pounds.  Pt's meals are: - Breakfast: Raw/soaked oatmeal, blueberries or other fruit, ground flaxseed, walnuts (2 tablespoons), soy milk - Lunch: Beans, whole grains, leafy greens, other vegetables and fruit - Dinner: Salad with beans/peas or vegetable-based soup. - Snacks: 0-2-fruit, veggies, homemade bean dip  -No CKD, last BUN/creatinine:  Lab Results  Component Value Date   BUN 6 08/29/2019   BUN 8 08/15/2019   CREATININE 0.52 08/29/2019   CREATININE 0.48 08/15/2019  Not on ACE inhibitor/ARB.  -+ History of HL, diet controlled; last set of lipids: Lab Results  Component Value Date   CHOL 144 08/15/2019   HDL 79.60 08/15/2019   LDLCALC 59 08/15/2019   TRIG 27.0 08/15/2019   CHOLHDL 2 08/15/2019  Not on a statin, but she is on a low-fat plant-based diet.  - last eye exam was on 02/28/2020: No DR  - no numbness and tingling in her feet.  Pt has FH of DM in MGM.  She has a history of high potassium and low sodium: Component     Latest Ref Rng & Units 11/08/2018  Sodium     135 - 145 mEq/L 133 (L)  Potassium     3.5 - 5.1 mEq/L 5.7 (H)   We ruled out adrenal insufficiency by a normal cosyntropin stimulation test: Component     Latest Ref Rng & Units 11/30/2018 11/30/2018 11/30/2018         8:15 AM  8:59 AM  9:26 AM  Cortisol, Plasma     ug/dL 16.8 24.2 28.0  C206 ACTH     6 - 50 pg/mL 15     Brother with otosclerosis.  ROS: Constitutional: no weight gain/no weight loss, no fatigue, no subjective hyperthermia, no subjective hypothermia Eyes: no blurry vision, no xerophthalmia ENT: no sore throat, no nodules palpated in neck, no dysphagia, no odynophagia, no hoarseness Cardiovascular: no CP/no SOB/no palpitations/no leg swelling Respiratory: no cough/no SOB/no wheezing Gastrointestinal: no  N/no V/no D/no C/no acid reflux Musculoskeletal: no muscle aches/no joint aches Skin: no rashes, no hair loss Neurological: no tremors/no numbness/no tingling/no dizziness  I reviewed pt's medications, allergies, PMH, social hx, family hx, and changes were documented in the history of present illness. Otherwise, unchanged from my initial visit note.  Past Medical History:  Diagnosis Date  . Chicken pox   . Depression    Resolved. in 24s- meds and counseling.  . Family history of hemochromatosis    father- screen iron  . Gestational diabetes 1987   a1c q3 years  . Hammer toe   . Morton's neuroma    per records   Past Surgical History:  Procedure Laterality Date  . ENDOMETRIAL ABLATION  2010   heavy menstrual bleeding  . TONSILLECTOMY AND ADENOIDECTOMY  1965   Social History   Socioeconomic History  . Marital status: Married    Spouse name: Not on file  . Number of children: 2  . Years of education: Not on file  . Highest education level: Not on file  Occupational History  .  Librarian  Social Needs  . Financial resource strain: Not on file  . Food insecurity:    Worry: Not on file    Inability: Not on file  . Transportation needs:    Medical: Not on file    Non-medical: Not on file  Tobacco Use  . Smoking status: Never Smoker  . Smokeless tobacco: Never Used  Substance and Sexual Activity  . Alcohol use: Yes    Alcohol/week:  2-4 drinks per week    Types:  Wine/beer  . Drug use: No  . Sexual activity: Yes  Lifestyle  . Physical activity: Walk/hike/spin class/weight resistance/stretching    Days per week: 7    Minutes per session: Not on file  Relationships  Social History Narrative   Married. (husband [name] of Dr. Yong Barton). 2 children grown and married. Will be grandmother Brittany Barton.    Has had 12 moves in 32 years, most recently [place] for 1.5 years, [place] 5 years prior, [place] prior      B.A. In nutrition. Masters in [employer].  In between jobs as Licensed conveyancer.       Current Outpatient Medications on File Prior to Visit  Medication Sig Dispense Refill  . acyclovir ointment (ZOVIRAX) 5 % Apply 1 application topically every 3 (three) hours. 30 g 1  . BAQSIMI TWO PACK 3 MG/DOSE POWD PLACE 3 MG INTO THE NOSE ONCE FOR 1 DOSE.    Marland Kitchen Cholecalciferol (D3 HIGH POTENCY) 2000 units CAPS Take 1 capsule by mouth daily.    . Continuous Blood  Gluc Receiver (FREESTYLE LIBRE 2 READER) DEVI 1 each by Does not apply route daily. 1 each 0  . Continuous Blood Gluc Sensor (FREESTYLE LIBRE 14 DAY SENSOR) MISC 1 each by Does not apply route every 14 (fourteen) days. Change every 2 weeks 2 each 11  . Continuous Blood Gluc Sensor (FREESTYLE LIBRE 2 SENSOR) MISC 1 each by Does not apply route every 14 (fourteen) days. 6 each 3  . glucose blood test strip Use to test blood sugar daily 100 each 3  . Insulin Glargine (BASAGLAR KWIKPEN) 100 UNIT/ML SOPN Inject 0.04-0.06 mLs (4-6 Units total) into the skin daily. (Patient taking differently: Inject 2-3 Units into the skin daily. ) 5 pen 3  . insulin lispro (HUMALOG KWIKPEN) 100 UNIT/ML KwikPen Inject 0.06-0.1 mLs (6-10 Units total) into the skin 3 (three) times daily with meals. (Patient taking differently: Inject 3-10 Units into the skin 3 (three) times daily with meals. ) 15 mL 3  . Insulin Lispro-aabc, 1 U Dial, (LYUMJEV KWIKPEN) 100 UNIT/ML SOPN Inject 3 to 10 units before meals 3 times a day 10 pen 3  . Insulin Pen Needle (BD PEN NEEDLE NANO 2ND GEN) 32G X 4 MM MISC USE 4 TIMES A DAY WITH INSULIN 400 each 3  . Lancets (ACCU-CHEK SOFT TOUCH) lancets Use to test blood sugar daily 100 each 3  . vitamin B-12 (CYANOCOBALAMIN) 250 MCG tablet Take 250 mcg by mouth daily.     No current facility-administered medications on file prior to visit.   Allergies  Allergen Reactions  . Sulfa Antibiotics   . Penicillins Rash   Family History  Problem Relation Age of Onset  . Hemochromatosis Father   .  Alcoholism Father   . Hearing loss Brother        otosclerosis  . Hypertension Brother   . Hyperlipidemia Brother   . Stroke Other        grandparent  . Hypertension Other        grandparent  . Osteoporosis Mother   . Depression Mother        dealing with some memory issues  . Glaucoma Mother   . Diabetes Brother        ? PE as well- she will verify  . Hyperlipidemia Brother    PE: BP 100/60   Pulse 63   Ht 5' 0.25" (1.53 m)   Wt 116 lb (52.6 kg)   LMP 09/09/2007 (Approximate) Comment: this was with her endometrial ablation  SpO2 99%   BMI 22.47 kg/m  Wt Readings from Last 3 Encounters:  06/12/20 116 lb (52.6 kg)  02/08/20 118 lb (53.5 kg)  10/20/19 115 lb (52.2 kg)   Constitutional: normal weight, in NAD Eyes: PERRLA, EOMI, no exophthalmos ENT: moist mucous membranes, no thyromegaly, no cervical lymphadenopathy Cardiovascular: RRR, No MRG Respiratory: CTA B Gastrointestinal: abdomen soft, NT, ND, BS+ Musculoskeletal: no deformities, strength intact in all 4 Skin: moist, warm, no rashes Neurological: no tremor with outstretched hands, DTR normal in all 4  ASSESSMENT: 1. LADA, insulin-dependent, controlled, without long-term complications but with occasional hyperglycemia  She had low C-peptide and high GAD antibodies: Component     Latest Ref Rng & Units 03/17/2019  Glucose     65 - 99 mg/dL 192 (H)  C-Peptide     0.80 - 3.85 ng/mL 0.55 (L)   Component     Latest Ref Rng & Units 11/08/2018  Glucose     70 - 99 mg/dL 130 (H)  C-Peptide     0.80 - 3.85 ng/mL 0.55 (L)  Glutamic Acid Decarb Ab     <5 IU/mL >250 (H)  Insulin Antibodies, Human     <0.4 U/mL 3.6 (H)   Component     Latest Ref Rng & Units 11/30/2018  ZNT8 Antibodies     U/mL <15  Islet Cell Ab     Neg:<1:1 Negative   2. HL  PLAN:  1. Patient with several years of uncontrolled diabetes, on a plant-based diet.  She had positive GAD antibodies >> dx of LADA.  She keeps a strict exercise  routine (however, she did relax this slightly in the last few months as she was very busy) and controlling her diet -she is on a  low fat, low glycemic index plant-based diet, which is ideal.  She avoided started insulin for a long period of time but we ended up adding rapid acting insulin 2020 and long-acting insulin in 10/2019.  She is very insulin sensitive and uses low doses of insulin. -At last visit, HbA1c was 5.7%, stable, excellent.  At that time, we switched from fixed insulin doses to using insulin to carb ratios. Sugars were lower after she exercised so we discussed about using a higher insulin to carb ratio.  We also discussed about possibly using Lyumjev insulin which has a shorter onset time but she had a lot of Humalog at home.  However, we were able to start this since last visit and she loves it!  At last visit, we also discussed about how to manage lows on her CGM. I explained that this may appear lower than they actually are, by 20 to 30 mg/dL.  I advised her to always check with her glucometer and the sugars appear low on the CGM.  CGM interpretation: -At today's visit, we reviewed together her CGM tracings and it appears that sugars are controlled overnight and in the first half of the day but they are slightly higher after dinner.  Upon questioning, the high blood sugars may happen if she has starches with a particular meal.  She is using an insulin to carb ratio of 1:20 with all meals, but we discussed that with meals containing starches, she may need a lower ratio.  I advised her to try 1:16.  Otherwise, the vast majority of her blood sugars are at goal, with 89% of the blood sugars in target range.  She does have 4% of her blood sugars in the mild low range, between 54 and 69, but no sugars lower than 54.  Her glucose variability coefficient is at goal, lower than 36% (at 29.8%). -She has some mild low blood sugars overnight, between 56-60s, however, without feeling hypoglycemic.  We  discussed that if she does feel different when these blood sugars happen, she will need to check her glucose with her meter to make sure the values are still in the hypoglycemia range.  For now, as she is using such a low dose of Basaglar, of 3 units at bedtime most days, we will not decrease this. - I advised her to: Patient Instructions  Please continue: - Basaglar 3-4 units at bedtime  Please use: - Humalog insulin to carb ratio 1:16-20.  If you are exercising after the meal, try 1:30 for that meal.  Please return in 4 months.  - we checked her HbA1c: 5.8% (slightly higher)  - advised to check sugars at different times of the day - 4x a day, rotating check times -  advised for yearly eye exams >> she is UTD - return to clinic in 4 months  2. HL -Reviewed latest lipid panel from 08/2019: All fractions at goal Lab Results  Component Value Date   CHOL 144 08/15/2019   HDL 79.60 08/15/2019   LDLCALC 59 08/15/2019   TRIG 27.0 08/15/2019   CHOLHDL 2 08/15/2019  -She is not on a statin, but continues on the plant-based diet, which helps control her cholesterol levels  Philemon Kingdom, MD PhD [facility] Endocrinology

## 2020-06-12 NOTE — Addendum Note (Signed)
Addended by: Cardell Peach I on: 06/12/2020 10:21 AM   Modules accepted: Orders

## 2020-08-15 NOTE — Patient Instructions (Addendum)
Please stop by lab before you go If you have mychart- we will send your results within 3 business days of [place] receiving them.  If you do not have mychart- we will call you about results within 5 business days of [place] receiving them.  *please note we are currently using [facility] which has a longer processing time than [place] typically so labs may not come back as quickly as in the past *please also note that you will see labs on mychart as soon as they post. I will later go in and write notes on them- will say "notes from Dr. Yong Channel"  Health Maintenance Due  Topic Date Due  . INFLUENZA VACCINE - declines for now 04/08/2020  . MAMMOGRAM - declines for now 08/11/2020  . URINE MICROALBUMIN - Today in Office  08/14/2020   Please stop by lab before you go If you have mychart- we will send your results within 3 business days of [place] receiving them.  If you do not have mychart- we will call you about results within 5 business days of [place] receiving them.  *please note we are currently using [facility] which has a longer processing time than [place] typically so labs may not come back as quickly as in the past *please also note that you will see labs on mychart as soon as they post. I will later go in and write notes on them- will say "notes from Dr. Yong Channel"  Recommended follow up: Return in about 1 year (around 08/16/2021) for physical or sooner if needed.

## 2020-08-15 NOTE — Progress Notes (Signed)
Phone [phone]   Subjective:  Patient presents today for their annual physical. Chief complaint-noted.   See problem oriented charting- ROS- full  review of systems was completed and negative except for: slight congestion, runny nose at times- at tail end of winter cold- 1st day of symptoms beginning of nov so would be out of quarantine anyway even if did have covid  The following were reviewed and entered/updated in epic: Past Medical History:  Diagnosis Date  . Chicken pox   . Depression    Resolved. in 75s- meds and counseling.  . Family history of hemochromatosis    father- screen iron  . Gestational diabetes 1987   a1c q3 years  . Hammer toe   . Morton's neuroma    per records   Patient Active Problem List   Diagnosis Date Noted  . LADA (latent autoimmune diabetes of adulthood) ([place]) 11/23/2018    Priority: High  . Hyperlipidemia 08/04/2016    Priority: Medium  . Fever blister 08/04/2016    Priority: Low  . Depression     Priority: Low  . Family history of hemochromatosis     Priority: Low   Past Surgical History:  Procedure Laterality Date  . ENDOMETRIAL ABLATION  2010   heavy menstrual bleeding  . TONSILLECTOMY AND ADENOIDECTOMY  1965    Family History  Problem Relation Age of Onset  . Hemochromatosis Father   . Alcoholism Father   . Hearing loss Brother        otosclerosis  . Hypertension Brother   . Hyperlipidemia Brother   . Stroke Other        grandparent  . Hypertension Other        grandparent  . Osteoporosis Mother   . Depression Mother        dealing with some memory issues  . Glaucoma Mother   . Diabetes Brother        ? PE as well- she will verify  . Hyperlipidemia Brother     Medications- reviewed and updated Current Outpatient Medications  Medication Sig Dispense Refill  . acyclovir ointment (ZOVIRAX) 5 % Apply 1 application topically every 3 (three) hours. 30 g 1  . Cholecalciferol 50 MCG (2000 UT) CAPS Take 1 capsule by  mouth daily.    . Continuous Blood Gluc Receiver (FREESTYLE LIBRE 2 READER) DEVI 1 each by Does not apply route daily. 1 each 0  . Continuous Blood Gluc Sensor (FREESTYLE LIBRE 14 DAY SENSOR) MISC 1 each by Does not apply route every 14 (fourteen) days. Change every 2 weeks 2 each 11  . Continuous Blood Gluc Sensor (FREESTYLE LIBRE 2 SENSOR) MISC 1 each by Does not apply route every 14 (fourteen) days. 6 each 3  . glucose blood test strip Use to test blood sugar daily 100 each 3  . Insulin Glargine (BASAGLAR KWIKPEN) 100 UNIT/ML SOPN Inject 0.04-0.06 mLs (4-6 Units total) into the skin daily. (Patient taking differently: Inject 3-5 Units into the skin daily.) 5 pen 3  . Insulin Lispro-aabc, 1 U Dial, (LYUMJEV KWIKPEN) 100 UNIT/ML SOPN Inject 3 to 10 units before meals 3 times a day (Patient taking differently: 2-8 Units. Inject 2 to 8 units before meals 3 times a day) 10 pen 3  . Insulin Pen Needle (BD PEN NEEDLE NANO 2ND GEN) 32G X 4 MM MISC USE 4 TIMES A DAY WITH INSULIN 400 each 3  . Lancets (ACCU-CHEK SOFT TOUCH) lancets Use to test blood sugar daily 100 each 3  .  vitamin B-12 (CYANOCOBALAMIN) 250 MCG tablet Take 250 mcg by mouth daily.    Marland Kitchen BAQSIMI TWO PACK 3 MG/DOSE POWD PLACE 3 MG INTO THE NOSE ONCE FOR 1 DOSE. (Patient not taking: Reported on 08/16/2020)     No current facility-administered medications for this visit.    Allergies-reviewed and updated Allergies  Allergen Reactions  . Sulfa Antibiotics   . Penicillins Rash    Social History   Social History Narrative   Married. (husband [name] of Dr. Yong Channel). 2 children grown and married.   Son - 2 kids including oldest [place] [id number], son is 25 years old   Daughter- 28 year old granddaughter  youngest 2 in 2020 premature 2 months early (49 months old identical twin girls) 08/15/2019         Has had 30 moves in 26 years, most recently [place] for 1.5 years, [place] 5 years prior, [place] prior      B.A. In nutrition.  Masters in [employer].  Has worked as a Licensed conveyancer.      Objective  Objective:  BP 121/73   Pulse 62   Temp 98.1 F (36.7 C) (Temporal)   Ht 5' (1.524 m)   Wt 115 lb 6.4 oz (52.3 kg)   LMP 09/09/2007 (Approximate) Comment: this was with her endometrial ablation  SpO2 100%   BMI 22.54 kg/m  Gen: NAD, resting comfortably HEENT: Mucous membranes are moist. Oropharynx normal Neck: no thyromegaly CV: RRR no murmurs rubs or gallops Lungs: CTAB no crackles, wheeze, rhonchi Abdomen: soft/nontender/nondistended/normal bowel sounds. No rebound or guarding.  Ext: no edema Skin: warm, dry Neuro: grossly normal, moves all extremities, PERRLA   Assessment and Plan   59 y.o. female presenting for annual physical.  Health Maintenance counseling: 1. Anticipatory guidance: Patient counseled regarding regular dental exams -q3-4 months, eye exams -yearly ,  avoiding smoking and second hand smoke , limiting alcohol to 1 beverage per day .   2. Risk factor reduction:  Advised patient of need for regular exercise and diet rich and fruits and vegetables to reduce risk of heart attack and stroke. Exercise- doing some exercise on daily basis- getting variable #s on sugars and has been frustrating. Diet- eats plant based diet.  Wt Readings from Last 3 Encounters:  08/16/20 115 lb 6.4 oz (52.3 kg)  06/12/20 116 lb (52.6 kg)  02/08/20 118 lb (53.5 kg)  3. Immunizations/screenings/ancillary studies-  Immunization History  Administered Date(s) Administered  . Influenza,inj,Quad PF,6+ Mos 07/31/2015, 08/04/2016, 08/05/2017, 08/11/2018, 06/20/2019  . PFIZER SARS-COV-2 Vaccination 11/18/2019, 12/09/2019, 06/07/2020  . Pneumococcal Polysaccharide-23 03/09/2018  . Tdap 07/31/2015  . Zoster Recombinat (Shingrix) 03/09/2018, 08/11/2018  4. Cervical cancer screening- 08/19/18 with 5 year repeat needed due HPV negative. 5. Breast cancer screening- declines for now- previous plan every other year. She wants  to consider this. Thinks risk lower with plant based diet (and likely is)  6. Colon cancer screening - 10/10/10 with 10 year repeat 7. Skin cancer screening- Dr. Wilhemina Bonito every other yearly. advised regular sunscreen use. Denies worrisome, changing, or new skin lesions.  8. Birth control/STD check- monogamous/postmenopausal  9. Osteoporosis screening at 33- we will plan on this -Never smoker  Status of chronic or acute concerns   #LADA- following closely with Dr. Cruzita Lederer. Average 132 over last 7 days- likely has space for higher sugars overall- asked her to contact Dr. Cruzita Lederer.  We will update HM items Lab Results  Component Value Date   HGBA1C 5.8 (A) 06/12/2020   #  uterine prolapse- using pessary. Holding off on surgery for now. Wants space to care for grandkids and her parents.   #hyperlipidemia S: Medication: none  Lab Results  Component Value Date   CHOL 144 08/15/2019   HDL 79.60 08/15/2019   LDLCALC 59 08/15/2019   TRIG 27.0 08/15/2019   CHOLHDL 2 08/15/2019   A/P: discussed statin and patient declines. #s look excellent on plant based diet so I think is reasonable- endocrinology is on board as well  # Depression S: Medication: none- remains in full remission off meds Depression screen [facility] 2/9 08/16/2020 08/15/2019 08/11/2018  Decreased Interest 0 0 0  Down, Depressed, Hopeless 0 0 1  PHQ - 2 Score 0 0 1  Altered sleeping 3 1 3   Tired, decreased energy 0 0 0  Change in appetite 2 0 0  Feeling bad or failure about yourself  0 0 1  Trouble concentrating 0 0 0  Moving slowly or fidgety/restless 0 0 0  Suicidal thoughts 0 0 0  PHQ-9 Score 5 1 5   Difficult doing work/chores Not difficult at all Not difficult at all Not difficult at all  A/P: full remission- continue to monitor. Ups and dows with moms dementia nad having to deal with dementia.  - seeing therapist to help with dealing with alcoholic father and mom with dementia and finds that helpful. Sees Aleen Sells in [place]  Recommended follow up: Return in about 1 year (around 08/16/2021) for physical or sooner if needed. Future Appointments  Date Time Provider [place]  10/16/2020  9:00 AM Philemon Kingdom, MD [id number] None   Lab/Order associations: not fasting   ICD-10-CM   1. Preventative health care  Z00.00   2. Major depressive disorder with single episode, in full remission ([place])  F32.5   3. Hyperlipidemia, unspecified hyperlipidemia type  E78.5   4. Encounter for screening mammogram for malignant neoplasm of breast  Z12.31   5. Hyperglycemia  R73.9   6. Controlled type 2 diabetes mellitus without complication, without long-term current use of insulin (H[facility])  E11.9     No orders of the defined types were placed in this encounter.   Return precautions advised.  Garret Reddish, MD

## 2020-08-16 ENCOUNTER — Encounter: Payer: Self-pay | Admitting: Family Medicine

## 2020-08-16 ENCOUNTER — Ambulatory Visit (INDEPENDENT_AMBULATORY_CARE_PROVIDER_SITE_OTHER): Payer: Managed Care, Other (non HMO) | Admitting: Family Medicine

## 2020-08-16 ENCOUNTER — Other Ambulatory Visit: Payer: Self-pay

## 2020-08-16 VITALS — BP 121/73 | HR 62 | Temp 98.1°F | Ht 60.0 in | Wt 115.4 lb

## 2020-08-16 DIAGNOSIS — Z0000 Encounter for general adult medical examination without abnormal findings: Secondary | ICD-10-CM

## 2020-08-16 DIAGNOSIS — E119 Type 2 diabetes mellitus without complications: Secondary | ICD-10-CM

## 2020-08-16 DIAGNOSIS — E785 Hyperlipidemia, unspecified: Secondary | ICD-10-CM | POA: Diagnosis not present

## 2020-08-16 DIAGNOSIS — R739 Hyperglycemia, unspecified: Secondary | ICD-10-CM

## 2020-08-16 DIAGNOSIS — F325 Major depressive disorder, single episode, in full remission: Secondary | ICD-10-CM

## 2020-08-16 DIAGNOSIS — E871 Hypo-osmolality and hyponatremia: Secondary | ICD-10-CM

## 2020-08-16 DIAGNOSIS — Z1231 Encounter for screening mammogram for malignant neoplasm of breast: Secondary | ICD-10-CM

## 2020-08-18 LAB — COMPLETE METABOLIC PANEL WITH GFR
AG Ratio: 2.1 (calc) (ref 1.0–2.5)
ALT: 12 U/L (ref 6–29)
AST: 18 U/L (ref 10–35)
Albumin: 4.6 g/dL (ref 3.6–5.1)
Alkaline phosphatase (APISO): 64 U/L (ref 37–153)
BUN: 8 mg/dL (ref 7–25)
CO2: 27 mmol/L (ref 20–32)
Calcium: 9.3 mg/dL (ref 8.6–10.4)
Chloride: 94 mmol/L — ABNORMAL LOW (ref 98–110)
Creat: 0.5 mg/dL (ref 0.50–0.99)
GFR, Est African American: 122 mL/min/{1.73_m2} (ref >=60)
GFR, Est Non African American: 105 mL/min/{1.73_m2} (ref >=60)
Globulin: 2.2 g/dL (calc) (ref 1.9–3.7)
Glucose, Bld: 60 mg/dL — ABNORMAL LOW (ref 65–99)
Potassium: 4.6 mmol/L (ref 3.5–5.3)
Sodium: 130 mmol/L — ABNORMAL LOW (ref 135–146)
Total Bilirubin: 0.7 mg/dL (ref 0.2–1.2)
Total Protein: 6.8 g/dL (ref 6.1–8.1)

## 2020-08-18 LAB — CBC WITH DIFFERENTIAL/PLATELET
Absolute Monocytes: 523 cells/uL (ref 200–950)
Basophils Absolute: 31 cells/uL (ref 0–200)
Basophils Relative: 0.8 %
Eosinophils Absolute: 20 cells/uL (ref 15–500)
Eosinophils Relative: 0.5 %
HCT: 39.9 % (ref 35.0–45.0)
Hemoglobin: 13.6 g/dL (ref 11.7–15.5)
Lymphs Abs: 944 cells/uL (ref 850–3900)
MCH: 31.3 pg (ref 27.0–33.0)
MCHC: 34.1 g/dL (ref 32.0–36.0)
MCV: 91.9 fL (ref 80.0–100.0)
MPV: 11.6 fL (ref 7.5–12.5)
Monocytes Relative: 13.4 %
Neutro Abs: 2383 cells/uL (ref 1500–7800)
Neutrophils Relative %: 61.1 %
Platelets: 170 10*3/uL (ref 140–400)
RBC: 4.34 10*6/uL (ref 3.80–5.10)
RDW: 11.7 % (ref 11.0–15.0)
Total Lymphocyte: 24.2 %
WBC: 3.9 10*3/uL (ref 3.8–10.8)

## 2020-08-18 LAB — LIPID PANEL
Cholesterol: 173 mg/dL (ref <200)
HDL: 102 mg/dL (ref >=50)
LDL Cholesterol (Calc): 57 mg/dL (calc)
Non-HDL Cholesterol (Calc): 71 mg/dL (calc) (ref <130)
Total CHOL/HDL Ratio: 1.7 (calc) (ref <5.0)
Triglycerides: 49 mg/dL (ref <150)

## 2020-08-18 LAB — OSMOLALITY: Osmolality: 268 mOsm/kg — ABNORMAL LOW (ref 278–305)

## 2020-08-18 LAB — MICROALBUMIN / CREATININE URINE RATIO
Creatinine, Urine: 8 mg/dL — ABNORMAL LOW (ref 20–275)
Microalb, Ur: <0.2 mg/dL

## 2020-08-18 LAB — OSMOLALITY, URINE: Osmolality, Ur: 142 mOsm/kg (ref 50–1200)

## 2020-08-18 LAB — SODIUM, URINE, RANDOM: Sodium, Ur: 33 mmol/L (ref 28–272)

## 2020-09-10 ENCOUNTER — Other Ambulatory Visit: Payer: Self-pay | Admitting: Internal Medicine

## 2020-10-16 ENCOUNTER — Ambulatory Visit (INDEPENDENT_AMBULATORY_CARE_PROVIDER_SITE_OTHER): Payer: Managed Care, Other (non HMO) | Admitting: Internal Medicine

## 2020-10-16 ENCOUNTER — Encounter: Payer: Self-pay | Admitting: Internal Medicine

## 2020-10-16 ENCOUNTER — Other Ambulatory Visit: Payer: Self-pay

## 2020-10-16 VITALS — BP 110/68 | HR 67 | Ht 60.0 in | Wt 117.0 lb

## 2020-10-16 DIAGNOSIS — E871 Hypo-osmolality and hyponatremia: Secondary | ICD-10-CM | POA: Insufficient documentation

## 2020-10-16 DIAGNOSIS — E785 Hyperlipidemia, unspecified: Secondary | ICD-10-CM

## 2020-10-16 DIAGNOSIS — E139 Other specified diabetes mellitus without complications: Secondary | ICD-10-CM

## 2020-10-16 LAB — POCT GLYCOSYLATED HEMOGLOBIN (HGB A1C): Hemoglobin A1C: 5.9 % — AB (ref 4.0–5.6)

## 2020-10-16 MED ORDER — ACCU-CHEK SOFTCLIX LANCETS MISC: 3 refills | Status: DC

## 2020-10-16 NOTE — Patient Instructions (Addendum)
Please decrease: - Basaglar 3-4 units at bedtime. If the sugars continue to decrease overnight, move the Basaglar to am.  - Humalog insulin to carb ratio 1:16-20.  If you are exercising after the meal, try 1:30 for that meal.  For mealtime insulin, round the doses up, not down.  Before next OV, try to reduce fluids to 1 L a day for 3-4 days.  Please return in 4 months.

## 2020-10-16 NOTE — Progress Notes (Addendum)
Patient ID: Brittany Barton, female   DOB: [birth year], 60 y.o.   MRN: [id number]   This visit occurred during the SARS-CoV-2 public health emergency.  Safety protocols were in place, including screening questions prior to the visit, additional usage of staff PPE, and extensive cleaning of exam room while observing appropriate contact time as indicated for disinfecting solutions.   HPI: Brittany Barton is a 60 y.o.-year-old female, initially referred by her PCP, Dr. 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  Last visit 4 months ago.  Reviewed HbA1c levels: Lab Results  Component Value Date   HGBA1C 5.8 (A) 06/12/2020   HGBA1C 5.7 (A) 02/08/2020   HGBA1C 5.7 (A) 10/20/2019   HGBA1C 5.6 06/20/2019   HGBA1C 7.3 (A) 03/17/2019   HGBA1C 6.6 (H) 11/08/2018   HGBA1C 6.0 08/04/2018   HGBA1C 5.4 03/09/2018   HGBA1C 7.6 12/03/2017   HGBA1C 6.6 (H) 08/06/2017   She is on: - Basaglar 4-6 >> 2-3 >> 3 (-4) >> 5-6 units daily at bedtime - added 10/2019 - Humalog >> Lyumjev - insulin to carb ratio 1:16-20.  If exercising after the meal, try 1: 30 for that meal.  She checks her sugars more than 4 times a day.  Freestyle libre CGM parameters: - Average: 129 >> 125 >> 128 - % active CGM time: 100% >> 100% >> 98% of the time - Glucose variability 27% >> 29.8% >> 28.3% (target < or = to 36%) - GMI: 6.4% >> 6.4% - time in range:  - very low (<54): 0% >> 0% >> 0% - low (54-69): 1% >> 4% >> 2% - normal range (70-180): 91% >> 89% >> 89% - high sugars (181-250): 7% >> 7% >> 9% - very high sugars (>250): 1% >> 0% >> 0%    Previously:   Previously:   Previously:   Lowest sugar was 40 >> 47 >> 58 >> 53; she has hypoglycemia awareness at 70. Highest sugar was 303 >> 303 >> 300 (Christmas) x1.  Glucometer: Accuchek   She continues on a plant-based diet without  high glycemic index foods. In 02/2015 she started a plant-based diet and lost more than 80 pounds.  Pt's meals are: - Breakfast: Raw/soaked oatmeal, blueberries or other fruit, ground flaxseed, walnuts (2 tablespoons), soy milk - Lunch: Beans, whole grains, leafy greens, other vegetables and fruit - Dinner: Salad with beans/peas or vegetable-based soup. - Snacks: 0-2-fruit, veggies, homemade bean dip  -No CKD, last BUN/creatinine:  Lab Results  Component Value Date   BUN 8 08/16/2020   BUN 6 08/29/2019   CREATININE 0.50 08/16/2020   CREATININE 0.52 08/29/2019  Not on ACE inhibitor/ARB.  -+ History of HL, diet controlled; last set of lipids: Lab Results  Component Value Date   CHOL 173 08/16/2020   HDL 102 08/16/2020   LDLCALC 57 08/16/2020   TRIG 49 08/16/2020   CHOLHDL 1.7 08/16/2020  No statins.  - last eye exam was in 02/2020: No DR  - NO numbness and tingling in her feet.  Pt has FH of DM in MGM.  She has a history of higher potassium and low sodium: Lab Results  Component Value Date   NA 130 (L) 08/16/2020   NA 126 (L) 08/29/2019   NA 126 (L) 08/15/2019   NA 130 (L) 03/17/2019   NA 133 (L) 11/08/2018   NA 129 (L) 08/04/2018  NA 134 (L) 08/06/2017   NA 134 (L) 07/29/2016   NA 136 07/31/2015   NA 139 05/24/2013   Lab Results  Component Value Date   K 4.6 08/16/2020   K 4.0 08/29/2019   K 4.5 08/15/2019   K 4.2 03/17/2019   K 5.7 (H) 11/08/2018   K 3.8 08/04/2018   K 4.0 08/06/2017   K 4.1 07/29/2016   K 4.1 07/31/2015   K 4.1 05/24/2013   We ruled out adrenal insufficiency by a normal cosyntropin stimulation test: Component     Latest Ref Rng & Units 11/30/2018 11/30/2018 11/30/2018         8:15 AM  8:59 AM  9:26 AM  Cortisol, Plasma     ug/dL 16.8 24.2 28.0  C206 ACTH     6 - 50 pg/mL 15     Also, she does not have a history of hypothyroidism: Lab Results  Component Value Date   TSH 1.07 11/08/2018   TSH 1.36 07/29/2016   TSH 2.41 07/31/2015    She is not on HCTZ, carbamazepine, amitriptyline, SSRIs, opiates. No history of brain injury. No history of generalized infections. No history of malignancy. No recent brain or chest imaging. No chronic kidney disease.  Also reviewed recent hyponatremia investigation by PCP: Component     Latest Ref Rng & Units 08/16/2020  Glucose     65 - 99 mg/dL 60 (L)  BUN     7 - 25 mg/dL 8  Creatinine     0.50 - 0.99 mg/dL 0.50  GFR, Est Non African American     > OR = 60 mL/min/1.44m2 105  Sodium     135 - 146 mmol/L 130 (L)  Potassium     3.5 - 5.3 mmol/L 4.6  Chloride     98 - 110 mmol/L 94 (L)  Osmolality, Urine     50 - 1,200 mOsm/kg 142  Osmolality     278 - 305 mOsm/kg 268 (L)  Sodium, Urine     28 - 272 mmol/L 33   Brother with otosclerosis.  ROS: Constitutional: no weight gain/no weight loss, no fatigue, no subjective hyperthermia, no subjective hypothermia Eyes: no blurry vision, no xerophthalmia ENT: no sore throat, no nodules palpated in neck, no dysphagia, no odynophagia, no hoarseness Cardiovascular: no CP/no SOB/no palpitations/no leg swelling Respiratory: no cough/no SOB/no wheezing Gastrointestinal: no N/no V/no D/no C/no acid reflux Musculoskeletal: no muscle aches/no joint aches Skin: no rashes, no hair loss Neurological: no tremors/no numbness/no tingling/no dizziness  I reviewed pt's medications, allergies, PMH, social hx, family hx, and changes were documented in the history of present illness. Otherwise, unchanged from my initial visit note.  Past Medical History:  Diagnosis Date  . Chicken pox   . Depression    Resolved. in 11s- meds and counseling.  . Family history of hemochromatosis    father- screen iron  . Gestational diabetes 1987   a1c q3 years  . Hammer toe   . Morton's neuroma    per records   Past Surgical History:  Procedure Laterality Date  . ENDOMETRIAL ABLATION  2010   heavy menstrual bleeding  . TONSILLECTOMY AND  ADENOIDECTOMY  1965   Social History   Socioeconomic History  . Marital status: Married    Spouse name: Not on file  . Number of children: 2  . Years of education: Not on file  . Highest education level: Not on file  Occupational History  .  Librarian  Social Needs  . Financial resource strain: Not on file  . Food insecurity:    Worry: Not on file    Inability: Not on file  . Transportation needs:    Medical: Not on file    Non-medical: Not on file  Tobacco Use  . Smoking status: Never Smoker  . Smokeless tobacco: Never Used  Substance and Sexual Activity  . Alcohol use: Yes    Alcohol/week:  2-4 drinks per week    Types:  Wine/beer  . Drug use: No  . Sexual activity: Yes  Lifestyle  . Physical activity: Walk/hike/spin class/weight resistance/stretching    Days per week: 7    Minutes per session: Not on file  Relationships  Social History Narrative   Married. (husband [name] of Dr. Yong Channel). 2 children grown and married. Will be grandmother December 2016.    Has had 12 moves in 32 years, most recently [place] for 1.5 years, [place] 5 years prior, [place] prior      B.A. In nutrition. Masters in [employer]. In between jobs as Licensed conveyancer.       Current Outpatient Medications on File Prior to Visit  Medication Sig Dispense Refill  . acyclovir ointment (ZOVIRAX) 5 % Apply 1 application topically every 3 (three) hours. 30 g 1  . BAQSIMI TWO PACK 3 MG/DOSE POWD PLACE 3 MG INTO THE NOSE ONCE FOR 1 DOSE. (Patient not taking: Reported on 08/16/2020)    . Cholecalciferol 50 MCG (2000 UT) CAPS Take 1 capsule by mouth daily.    . Continuous Blood Gluc Receiver (FREESTYLE LIBRE 2 READER) DEVI 1 each by Does not apply route daily. 1 each 0  . Continuous Blood Gluc Sensor (FREESTYLE LIBRE 14 DAY SENSOR) MISC 1 each by Does not apply route every 14 (fourteen) days. Change every 2 weeks 2 each 11  . Continuous Blood Gluc Sensor (FREESTYLE LIBRE 2 SENSOR) MISC USE 1  SENSOR EVERY 14 DAYS 6 each 3  . glucose blood test strip Use to test blood sugar daily 100 each 3  . Insulin Glargine (BASAGLAR KWIKPEN) 100 UNIT/ML SOPN Inject 0.04-0.06 mLs (4-6 Units total) into the skin daily. (Patient taking differently: Inject 3-5 Units into the skin daily.) 5 pen 3  . Insulin Lispro-aabc, 1 U Dial, (LYUMJEV KWIKPEN) 100 UNIT/ML SOPN Inject 3 to 10 units before meals 3 times a day (Patient taking differently: 2-8 Units. Inject 2 to 8 units before meals 3 times a day) 10 pen 3  . Insulin Pen Needle (BD PEN NEEDLE NANO 2ND GEN) 32G X 4 MM MISC USE 4 TIMES A DAY WITH INSULIN 400 each 3  . Lancets (ACCU-CHEK SOFT TOUCH) lancets Use to test blood sugar daily 100 each 3  . vitamin B-12 (CYANOCOBALAMIN) 250 MCG tablet Take 250 mcg by mouth daily.     No current facility-administered medications on file prior to visit.   Allergies  Allergen Reactions  . Sulfa Antibiotics   . Penicillins Rash   Family History  Problem Relation Age of Onset  . Hemochromatosis Father   . Alcoholism Father   . Hearing loss Brother        otosclerosis  . Hypertension Brother   . Hyperlipidemia Brother   . Stroke Other        grandparent  . Hypertension Other        grandparent  . Osteoporosis Mother   . Depression Mother        dealing with some memory issues  . Glaucoma  Mother   . Diabetes Brother        ? PE as well- she will verify  . Hyperlipidemia Brother    PE: BP 110/68   Pulse 67   Ht 5' (1.524 m)   Wt 117 lb (53.1 kg)   LMP 09/09/2007 (Approximate) Comment: this was with her endometrial ablation  SpO2 98%   BMI 22.85 kg/m  Wt Readings from Last 3 Encounters:  10/16/20 117 lb (53.1 kg)  08/16/20 115 lb 6.4 oz (52.3 kg)  06/12/20 116 lb (52.6 kg)   Constitutional: normal weight, in NAD Eyes: PERRLA, EOMI, no exophthalmos ENT: moist mucous membranes, no thyromegaly, no cervical lymphadenopathy Cardiovascular: RRR, No MRG Respiratory: CTA B Gastrointestinal:  abdomen soft, NT, ND, BS+ Musculoskeletal: no deformities, strength intact in all 4 Skin: moist, warm, no rashes Neurological: no tremor with outstretched hands, DTR normal in all 4  ASSESSMENT: 1. LADA, insulin-dependent, controlled, without long-term complications but with occasional hyperglycemia  She had low C-peptide and high GAD antibodies: Component     Latest Ref Rng & Units 03/17/2019  Glucose     65 - 99 mg/dL 192 (H)  C-Peptide     0.80 - 3.85 ng/mL 0.55 (L)   Component     Latest Ref Rng & Units 11/08/2018  Glucose     70 - 99 mg/dL 130 (H)  C-Peptide     0.80 - 3.85 ng/mL 0.55 (L)  Glutamic Acid Decarb Ab     <5 IU/mL >250 (H)  Insulin Antibodies, Human     <0.4 U/mL 3.6 (H)   Component     Latest Ref Rng & Units 11/30/2018  ZNT8 Antibodies     U/mL <15  Islet Cell Ab     Neg:<1:1 Negative   2. HL  3. Hyponatremia  PLAN:  1. Patient with history of LADA diagnosed in 2020 based on positive GAD antibodies and decreased C-peptide.  She continues on a low glycemic index plant-based diet and exercising consistently.  She was able to avoid starting insulin for long period of time but we ended up adding mealtime insulin in 2020 and long-acting insulin in 10/2019.  She is very insulin sensitive and uses low doses of insulin.  In 02/2020 we switched from fixed insulin doses to using insulin to carb ratios and insulin sliding scale.  Also, before last visit, we were able to switch from Humalog to Lyumjev, which she loved.  At last visit, HbA1c was excellent, at 5.8%.  At that time, sugars were controlled overnight and in the first half of the day but they were slightly higher.  Upon questioning, does high blood sugars appeared if she had starches with a meal.  Therefore, I advised her to use a slightly different insulin to carb ratio if she had starches,1:16 instead of 1:20.  However, we discussed that if she was exercising after a meal, to use an insulin to carb ratio 1:30 with  that meal.  At last visit she had some mild low blood sugars on the CGM, between 54 and 69, and we discussed that some of these may be higher if she checked with her glucometer.  She did not have hypoglycemic symptoms with these values. CGM intervention: -At today's visit, we reviewed her CGM downloads: It appears that 89% of values are in target range (goal >70%), while 9% are higher than 180 (goal <25%), and now only 2% are lower than 70 (goal <4%) -at last visit, 4% were lower than  goal.  The calculated average blood sugar is 128.  The projected HbA1c for the next 3 months (GMI) is 6.4%. -Reviewing the CGM trends, it appears that her sugars decrease overnight to sugars in the 50s in the morning and upon questioning, she is actually taking more Basaglar than recommended, guiding the dose on the sugars at bedtime, which have been higher.  We discussed about decreasing the dose of Basaglar back to 3-4 units.  If the sugars continue to drop afterwards, I advised her to move Basaglar in the morning.  I advised her how to transition the dose to mornings.  During the day, sugars are controlled, with excellent blood sugars after meals, with the exception of dinner, when sugars would be higher after meals.  She is using an approximately insulin to carb ratio of 1:17 with dinner.  We discussed about running up rather than down when doing the calculations, but for now, I would not suggest to change this.  We discussed about different doses of insulin required for meals with higher starches versus meals with higher fat or protein.  I recommended against using more insulin when having more fat in her diet to avoid low blood sugars immediately after meal. - I advised her to: Patient Instructions  Please decrease: - Basaglar 3-4 units at bedtime. If the sugars continue to decrease overnight, move the Basaglar to am.  - Humalog insulin to carb ratio 1:16-20.  If you are exercising after the meal, try 1:30 for that  meal.  For mealtime insulin, round the doses up, not down.  Before next OV, try to reduce fluids to 1 L a day for 3-4 days.  Please return in 4 months.  - we checked her HbA1c: 5.9% (slightly higher) - advised to check sugars at different times of the day - 4x a day, rotating check times - advised for yearly eye exams >> she is UTD - return to clinic in 4 months  2. HL -Reviewed latest lipid panel from 08/2020: All fractions at goal: Lab Results  Component Value Date   CHOL 173 08/16/2020   HDL 102 08/16/2020   LDLCALC 57 08/16/2020   TRIG 49 08/16/2020   CHOLHDL 1.7 08/16/2020  -She is not on a statin but does a great job on the plant-based diet, which helps control her cholesterol levels  3.  Hyponatremia -Asymptomatic: Denies headaches, mental fog, disequilibrium -Na 126-130 -At today's visit, we reviewed together her recent labs:  Plasma sodium was 130, slightly low >> hyponatremia  Plasma osmolality was 268, lower than 280 >> hypoosmolar hyponatremia  Urine sodium was 33, not very low >> ruling out excessive fluid losses (chronic diuretics, GI losses, excessive perspiration) or water overload  Urine osmolality was 142, higher than 100 >> inappropriately high for hyponatremia -Therefore, potential diagnoses are: - SIADH -previous TFTs were normal-we will recheck today -cosyntropin stimulation test was normal  -she is not taking potential culprit medications - Reset osmostat -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  The differentiation between these 2 is made by water load test, which I cannot perform in the clinic.  However, fluid restrictions would work for SIADH but not for reset osmostat.  She is worried about reducing her fluid intake.  For now, I advised her to reduce her fluids for 3 to 4 days before our next visit and will check sodium then.  If sodium is normal, she most likely has SIADH.  If sodium is still low, she may  have reset  osmostat. -If she has SIADH, chest x-ray or cranial imaging may be necessary -if she has reset osmostat, we can just follow this -We discussed about other possible treatments, but these are cumbersome, some are expensive, and have side effects: Demeclocycline, urea, AVP receptor antagonists (vaptains).  If she has SIADH, more prolonged fluid restriction may be necessary.  - Total time spent for the visit: 40 minutes, in obtaining medical information from the patient and the chart, reviewing her  previous labs, imaging evaluations, and treatments, reviewing her symptoms, counseling her about her conditions (please see the discussed topics above), and developing a plan to further investigate and treat them; she had a list of questions which I addressed.  Philemon Kingdom, MD PhD [facility] Endocrinology

## 2020-10-18 ENCOUNTER — Encounter: Payer: Self-pay | Admitting: Internal Medicine

## 2020-10-18 DIAGNOSIS — E139 Other specified diabetes mellitus without complications: Secondary | ICD-10-CM

## 2020-10-19 ENCOUNTER — Encounter: Payer: Self-pay | Admitting: Internal Medicine

## 2020-10-19 MED ORDER — ACCU-CHEK FASTCLIX LANCETS MISC: 3 refills | Status: DC

## 2020-10-19 MED ORDER — GLUCOSE BLOOD VI STRP: ORAL_STRIP | 3 refills | Status: DC

## 2020-10-20 ENCOUNTER — Telehealth (INDEPENDENT_AMBULATORY_CARE_PROVIDER_SITE_OTHER): Payer: Managed Care, Other (non HMO) | Admitting: Family Medicine

## 2020-10-20 ENCOUNTER — Encounter: Payer: Self-pay | Admitting: Family Medicine

## 2020-10-20 DIAGNOSIS — H1032 Unspecified acute conjunctivitis, left eye: Secondary | ICD-10-CM | POA: Diagnosis not present

## 2020-10-20 MED ORDER — POLYMYXIN B-TRIMETHOPRIM 10000-0.1 UNIT/ML-% OP SOLN: 2.0000 [drp] | Freq: Four times a day (QID) | OPHTHALMIC | 0 refills | Status: DC

## 2020-10-20 NOTE — Progress Notes (Signed)
Chief Complaint  Patient presents with  . Conjunctivitis    Left eye . Drainage     Brittany Barton is here for left eye irritation. Due to COVID-19 pandemic, we are interacting via web portal for an electronic face-to-face visit. I verified patient's ID using 2 identifiers. Patient agreed to proceed with visit via this method. Patient is at home, I am at office. Patient and I are present for visit.   Duration: 1 d  Drainage and crust from eye.  Chemical exposure? No  Recent URI? No  Contact lenses? No  History of allergies? No   Itchy, no pain.  No vision changes.  Treatment to date: warm compresses  Past Medical History:  Diagnosis Date  . Chicken pox   . Depression    Resolved. in 34s- meds and counseling.  . Family history of hemochromatosis    father- screen iron  . Gestational diabetes 1987   a1c q3 years  . Hammer toe   . Morton's neuroma    per records   LMP 09/09/2007 (Approximate) Comment: this was with her endometrial ablation No conversational dyspnea Age appropriate judgment and insight Nml affect and mood  Acute conjunctivitis of left eye, unspecified acute conjunctivitis type - Plan: trimethoprim-polymyxin b (POLYTRIM) ophthalmic solution  Instructed to practice good hand hygiene and try not to touch face. Warm compresses and artificial tears also recommended. F/u if no improvement in 7-10 days. Pt voiced understanding and agreement to the plan.  [place], DO 10/20/20 9:55 AM

## 2020-10-29 ENCOUNTER — Other Ambulatory Visit: Payer: Self-pay | Admitting: Internal Medicine

## 2021-01-23 ENCOUNTER — Telehealth: Payer: Self-pay

## 2021-01-23 NOTE — Telephone Encounter (Signed)
Please schedule virtual for pt. 

## 2021-01-23 NOTE — Telephone Encounter (Signed)
Patient just tested positive for covid  and would like some anti viral medication sent in to [id number] - [address], [place]

## 2021-01-24 NOTE — Telephone Encounter (Signed)
Pt scheduled  

## 2021-01-25 ENCOUNTER — Telehealth (INDEPENDENT_AMBULATORY_CARE_PROVIDER_SITE_OTHER): Payer: BC Managed Care – PPO | Admitting: Internal Medicine

## 2021-01-25 ENCOUNTER — Encounter: Payer: Self-pay | Admitting: Internal Medicine

## 2021-01-25 ENCOUNTER — Other Ambulatory Visit: Payer: Self-pay

## 2021-01-25 DIAGNOSIS — E139 Other specified diabetes mellitus without complications: Secondary | ICD-10-CM | POA: Diagnosis not present

## 2021-01-25 DIAGNOSIS — U071 COVID-19: Secondary | ICD-10-CM | POA: Diagnosis not present

## 2021-01-25 MED ORDER — MOLNUPIRAVIR 200 MG PO CAPS: 4.0000 | ORAL_CAPSULE | Freq: Two times a day (BID) | ORAL | 0 refills | Status: DC

## 2021-01-25 NOTE — Progress Notes (Signed)
Subjective:    Patient ID: Brittany Barton, female    DOB: 1961-07-18, 61 y.o.   MRN: [id number]  HPI Video virtual visit due to positive COVID test Identification done Reviewed limitations and billing and she gave consent Participants--patient and her husband (also being seen) in their home, and I am in my office  Started with symptoms 3.5 days ago Fever up to 102.5 Congestion, lots of cough---"junk" mucus Some chills Taste is "different"---appetite is off  Sugars went up--had to double her insulin Some body swelling and trouble passing urine---weight went up Last night --she did diurese back down again Eating better now  Does have some SOB when she feels congested--nothing persistent Using mucinex DM and tylenol/motrin  Current Outpatient Medications on File Prior to Visit  Medication Sig Dispense Refill  . Accu-Chek FastClix Lancets MISC Use as instructed 204 each 3  . acyclovir ointment (ZOVIRAX) 5 % Apply 1 application topically every 3 (three) hours. 30 g 1  . BAQSIMI TWO PACK 3 MG/DOSE POWD     . Cholecalciferol 50 MCG (2000 UT) CAPS Take 1 capsule by mouth daily.    . Continuous Blood Gluc Receiver (FREESTYLE LIBRE 2 READER) DEVI 1 each by Does not apply route daily. 1 each 0  . Continuous Blood Gluc Sensor (FREESTYLE LIBRE 2 SENSOR) MISC USE 1 SENSOR EVERY 14 DAYS 6 each 3  . glucose blood test strip Use as instructed to check blood sugar 4 times a day 150 each 3  . Insulin Glargine (BASAGLAR KWIKPEN) 100 UNIT/ML INJECT 0.04-0.06 MLS (4-6 UNITS TOTAL) INTO THE SKIN DAILY. 6 mL 1  . Insulin Lispro-aabc, 1 U Dial, (LYUMJEV KWIKPEN) 100 UNIT/ML SOPN Inject 3 to 10 units before meals 3 times a day (Patient taking differently: 2-8 Units. Inject 2 to 8 units before meals 3 times a day) 10 pen 3  . Insulin Pen Needle (BD PEN NEEDLE NANO 2ND GEN) 32G X 4 MM MISC USE 4 TIMES A DAY WITH INSULIN 400 each 3  . vitamin B-12 (CYANOCOBALAMIN) 250 MCG tablet Take 250 mcg by mouth  daily.     No current facility-administered medications on file prior to visit.    Allergies  Allergen Reactions  . Sulfa Antibiotics   . Penicillins Rash    Past Medical History:  Diagnosis Date  . Chicken pox   . Depression    Resolved. in 105s- meds and counseling.  . Family history of hemochromatosis    father- screen iron  . Gestational diabetes 1987   a1c q3 years  . Hammer toe   . Morton's neuroma    per records    Past Surgical History:  Procedure Laterality Date  . ENDOMETRIAL ABLATION  2010   heavy menstrual bleeding  . TONSILLECTOMY AND ADENOIDECTOMY  1965    Family History  Problem Relation Age of Onset  . Hemochromatosis Father   . Alcoholism Father   . Hearing loss Brother        otosclerosis  . Hypertension Brother   . Hyperlipidemia Brother   . Stroke Other        grandparent  . Hypertension Other        grandparent  . Osteoporosis Mother   . Depression Mother        dealing with some memory issues  . Glaucoma Mother   . Diabetes Brother        ? PE as well- she will verify  . Hyperlipidemia Brother  Social History   Socioeconomic History  . Marital status: Married    Spouse name: Not on file  . Number of children: Not on file  . Years of education: Not on file  . Highest education level: Not on file  Occupational History  . Not on file  Tobacco Use  . Smoking status: Never Smoker  . Smokeless tobacco: Never Used  Vaping Use  . Vaping Use: Never used  Substance and Sexual Activity  . Alcohol use: Yes    Alcohol/week: 10.0 standard drinks    Types: 10 Standard drinks or equivalent per week  . Drug use: No  . Sexual activity: Yes  Other Topics Concern  . Not on file  Social History Narrative   Married. (husband [name] of Dr. Yong Channel). 2 children grown and married.   Son - 2 kids including oldest De [place]anch [id number], son is 60 years old   Daughter- 26 year old granddaughter  youngest 2 in 2020 premature 2 months early (47  months old identical twin girls) 08/15/2019         Has had 21 moves in 73 years, most recently [place] for 1.5 years, [place] 5 years prior, [place] prior      B.A. In nutrition. Masters in [employer].  Has worked as a Licensed conveyancer.      Social Determinants of Health   Financial Resource Strain: Not on file  Food Insecurity: Not on file  Transportation Needs: Not on file  Physical Activity: Not on file  Stress: Not on file  Social Connections: Not on file  Intimate Partner Violence: Not on file   Review of Systems Poor sleep at first--did better last night Some nausea-no vomiting Some loose stools and multiple--but not really diarrhea    Objective:   Physical Exam Constitutional:      Appearance: Normal appearance.  Pulmonary:     Effort: Pulmonary effort is normal. No respiratory distress.  Neurological:     Mental Status: She is alert.            Assessment & Plan:

## 2021-01-25 NOTE — Assessment & Plan Note (Signed)
Insulin dose markedly higher---she will contact Dr Cruzita Lederer for instructions

## 2021-01-25 NOTE — Assessment & Plan Note (Signed)
Has been boosted but moderate symptoms Drug interaction with her insulin---and her sugars already higher, so will try the molpuniravir Continue supportive care Urgent care if worsens

## 2021-02-19 ENCOUNTER — Other Ambulatory Visit: Payer: Self-pay

## 2021-02-19 ENCOUNTER — Other Ambulatory Visit (INDEPENDENT_AMBULATORY_CARE_PROVIDER_SITE_OTHER): Payer: BC Managed Care – PPO

## 2021-02-19 ENCOUNTER — Ambulatory Visit: Payer: BC Managed Care – PPO | Admitting: Internal Medicine

## 2021-02-19 ENCOUNTER — Encounter: Payer: Self-pay | Admitting: Internal Medicine

## 2021-02-19 VITALS — BP 118/78 | HR 68 | Ht 60.0 in | Wt 115.2 lb

## 2021-02-19 DIAGNOSIS — E139 Other specified diabetes mellitus without complications: Secondary | ICD-10-CM

## 2021-02-19 DIAGNOSIS — E871 Hypo-osmolality and hyponatremia: Secondary | ICD-10-CM | POA: Diagnosis not present

## 2021-02-19 DIAGNOSIS — E785 Hyperlipidemia, unspecified: Secondary | ICD-10-CM | POA: Diagnosis not present

## 2021-02-19 LAB — BASIC METABOLIC PANEL
BUN: 8 mg/dL (ref 6–23)
CO2: 30 mEq/L (ref 19–32)
Calcium: 9.2 mg/dL (ref 8.4–10.5)
Chloride: 98 mEq/L (ref 96–112)
Creatinine, Ser: 0.52 mg/dL (ref 0.40–1.20)
GFR: 100.71 mL/min (ref >60.00)
Glucose, Bld: 64 mg/dL — ABNORMAL LOW (ref 70–99)
Potassium: 4.3 mEq/L (ref 3.5–5.1)
Sodium: 133 mEq/L — ABNORMAL LOW (ref 135–145)

## 2021-02-19 LAB — POCT GLYCOSYLATED HEMOGLOBIN (HGB A1C): Hemoglobin A1C: 5.8 % — AB (ref 4.0–5.6)

## 2021-02-19 NOTE — Patient Instructions (Addendum)
Please continue: - Basaglar 7-8 units at bedtime - Humalog insulin to carb ratio 1:22-30.   Please stop at the lab.  Please return in 4 months.

## 2021-02-19 NOTE — Progress Notes (Addendum)
Patient ID: Brittany Barton, female   DOB: 25-Jan-1961, 60 y.o.   MRN: [id number]   This visit occurred during the SARS-CoV-2 public health emergency.  Safety protocols were in place, including screening questions prior to the visit, additional usage of staff PPE, and extensive cleaning of exam room while observing appropriate contact time as indicated for disinfecting solutions.   HPI: Brittany Barton is a 60 y.o.-year-old female, initially referred by her PCP, Dr. 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  Last visit 4 months ago.  Interim history: Patient recently had COVID-19 in 01/2021.  At that time, sugars were higher, up to 350, we discussed about increasing the Basaglar dose. She recovered well afterwards. She is now feeling well, without complaints. No increased urination, blurry vision, nausea, chest pain, HAs, disequilibrium, dizziness.  Reviewed HbA1c levels: Lab Results  Component Value Date   HGBA1C 5.9 (A) 10/16/2020   HGBA1C 5.8 (A) 06/12/2020   HGBA1C 5.7 (A) 02/08/2020   HGBA1C 5.7 (A) 10/20/2019   HGBA1C 5.6 06/20/2019   HGBA1C 7.3 (A) 03/17/2019   HGBA1C 6.6 (H) 11/08/2018   HGBA1C 6.0 08/04/2018   HGBA1C 5.4 03/09/2018   HGBA1C 7.6 12/03/2017   She is on: - Basaglar- added 10/2019: 4-6 >> 2-3 >> 3 (-4) >> 5-6 >> 3-4 >> 7-8 units at bedtime - Humalog >> Lyumjev - insulin to carb ratio 1:16-20 >> 1:22.  If exercising after the meal, 1:30 for that meal.  She checks her sugars more than 4 times a day with her freestyle libre CGM:   Previously:   Previously:   Lowest sugar was 40 >> 47 ... >> 53 >> 60s; she has hypoglycemia awareness at 70. Highest sugar was 303 >> 300 (Christmas) x1 >> 350 (COVID infection)  Glucometer: Accuchek   She continues on a plant-based diet without high glycemic index foods. In 02/2015 she started a  plant-based diet and lost more than 80 pounds.  Pt's meals are: - Breakfast: Raw/soaked oatmeal, blueberries or other fruit, ground flaxseed, walnuts (2 tablespoons), soy milk - Lunch: Beans, whole grains, leafy greens, other vegetables and fruit - Dinner: Salad with beans/peas or vegetable-based soup. - Snacks: 0-2-fruit, veggies, homemade bean dip, PB + jelly  -No CKD, last BUN/creatinine:  Lab Results  Component Value Date   BUN 8 08/16/2020   BUN 6 08/29/2019   CREATININE 0.50 08/16/2020   CREATININE 0.52 08/29/2019  Not on ACE inhibitor/ARB.  -+ History of HL, diet controlled; last set of lipids: Lab Results  Component Value Date   CHOL 173 08/16/2020   HDL 102 08/16/2020   LDLCALC 57 08/16/2020   TRIG 49 08/16/2020   CHOLHDL 1.7 08/16/2020  No statins.  - last eye exam was 02/28/2020: No DR  - NO numbness and tingling in her feet.  Pt has FH of DM in MGM.  Hyponatremia: She has a history of higher potassium and low sodium: Lab Results  Component Value Date   NA 130 (L) 08/16/2020   NA 126 (L) 08/29/2019   NA 126 (L) 08/15/2019   NA 130 (L) 03/17/2019   NA 133 (L) 11/08/2018   NA 129 (L) 08/04/2018   NA 134 (L) 08/06/2017   NA 134 (L) 07/29/2016   NA 136 07/31/2015   NA 139 05/24/2013   Lab Results  Component Value Date   K 4.6 08/16/2020   K  4.0 08/29/2019   K 4.5 08/15/2019   K 4.2 03/17/2019   K 5.7 (H) 11/08/2018   K 3.8 08/04/2018   K 4.0 08/06/2017   K 4.1 07/29/2016   K 4.1 07/31/2015   K 4.1 05/24/2013   Investigation for hyponatremia reviewed: Component     Latest Ref Rng & Units 08/16/2020  Glucose     65 - 99 mg/dL 60 (L)  BUN     7 - 25 mg/dL 8  Creatinine     0.50 - 0.99 mg/dL 0.50  GFR, Est Non African American     > OR = 60 mL/min/1.39m2 105  Sodium     135 - 146 mmol/L 130 (L)  Potassium     3.5 - 5.3 mmol/L 4.6  Chloride     98 - 110 mmol/L 94 (L)  Osmolality, Urine     50 - 1,200 mOsm/kg 142  Osmolality     278 -  305 mOsm/kg 268 (L)  Sodium, Urine     28 - 272 mmol/L 33   Plasma sodium was 130, slightly low >> hyponatremia Plasma osmolality was 268, lower than 280 >> hypoosmolar hyponatremia Urine sodium was 33, not very low >> ruling out excessive fluid losses (chronic diuretics, GI losses, excessive perspiration) or water overload Urine osmolality was 142, higher than 100 >> inappropriately high for hyponatremia  We ruled out adrenal insufficiency by a normal cosyntropin stimulation test: Component     Latest Ref Rng & Units 11/30/2018 11/30/2018 11/30/2018         8:15 AM  8:59 AM  9:26 AM  Cortisol, Plasma     ug/dL 16.8 24.2 28.0  C206 ACTH     6 - 50 pg/mL 15     Also, she does not have a history of hypothyroidism: Lab Results  Component Value Date   TSH 1.07 11/08/2018   TSH 1.36 07/29/2016   TSH 2.41 07/31/2015   She is not on HCTZ, carbamazepine, amitriptyline, SSRIs, opiates. No history of brain injury. No history of generalized infections. No history of malignancy. No recent brain or chest imaging. No chronic kidney disease.  At last visit I advised her to try to reduce her fluid intake to 1 L a day for the 3 to 4 days prior to this appointment.  At today's visit, she tells me that she was able to limit her intake to 1.5 L a day, but this is difficult for her to do.  She had a lot of fluid retention during her COVID-19 episode and after taking the antiviral medication she eliminated this (approximately 7 to 8 pounds).  Brother with otosclerosis.  ROS: Constitutional: no weight gain/no weight loss, no fatigue, no subjective hyperthermia, no subjective hypothermia Eyes: no blurry vision, no xerophthalmia ENT: no sore throat, no nodules palpated in neck, no dysphagia, no odynophagia, no hoarseness Cardiovascular: no CP/no SOB/no palpitations/no leg swelling Respiratory: no cough/no SOB/no wheezing Gastrointestinal: no N/no V/no D/no C/no acid reflux Musculoskeletal: no  muscle aches/no joint aches Skin: no rashes, no hair loss Neurological: no tremors/no numbness/no tingling/no dizziness  I reviewed pt's medications, allergies, PMH, social hx, family hx, and changes were documented in the history of present illness. Otherwise, unchanged from my initial visit note.  Past Medical History:  Diagnosis Date   Chicken pox    Depression    Resolved. in 6s- meds and counseling.   Family history of hemochromatosis    father- screen iron   Gestational diabetes  1987   a1c q3 years   Hammer toe    Morton's neuroma    per records   Past Surgical History:  Procedure Laterality Date   ENDOMETRIAL ABLATION  2010   heavy menstrual bleeding   TONSILLECTOMY AND ADENOIDECTOMY  1965   Social History   Socioeconomic History   Marital status: Married    Spouse name: Not on file   Number of children: 2   Years of education: Not on file   Highest education level: Not on file  Occupational History    Librarian  Social Needs   Financial resource strain: Not on file   Food insecurity:    Worry: Not on file    Inability: Not on file   Transportation needs:    Medical: Not on file    Non-medical: Not on file  Tobacco Use   Smoking status: Never Smoker   Smokeless tobacco: Never Used  Substance and Sexual Activity   Alcohol use: Yes    Alcohol/week:  2-4 drinks per week    Types:  Wine/beer   Drug use: No   Sexual activity: Yes  Lifestyle   Physical activity: Walk/hike/spin class/weight resistance/stretching    Days per week: 7    Minutes per session: Not on file  Relationships  Social History Narrative   Married. (husband Jeneen Rinks, [name]). 2 children grown and married. B/c grandmother December 2016.    Has had 82 moves in 22 years, most recently [place] for 1.5 years, [place] 5 years prior, [place] prior      [employer]. In nutrition. Masters in [employer]. In between jobs as Licensed conveyancer.       Current Outpatient Medications on File  Prior to Visit  Medication Sig Dispense Refill   Accu-Chek FastClix Lancets MISC Use as instructed 204 each 3   acyclovir ointment (ZOVIRAX) 5 % Apply 1 application topically every 3 (three) hours. 30 g 1   BAQSIMI TWO PACK 3 MG/DOSE POWD      Cholecalciferol 50 MCG (2000 UT) CAPS Take 1 capsule by mouth daily.     Continuous Blood Gluc Receiver (FREESTYLE LIBRE 2 READER) DEVI 1 each by Does not apply route daily. 1 each 0   Continuous Blood Gluc Sensor (FREESTYLE LIBRE 2 SENSOR) MISC USE 1 SENSOR EVERY 14 DAYS 6 each 3   glucose blood test strip Use as instructed to check blood sugar 4 times a day 150 each 3   Insulin Glargine (BASAGLAR KWIKPEN) 100 UNIT/ML INJECT 0.04-0.06 MLS (4-6 UNITS TOTAL) INTO THE SKIN DAILY. 6 mL 1   Insulin Lispro-aabc, 1 U Dial, (LYUMJEV KWIKPEN) 100 UNIT/ML SOPN Inject 3 to 10 units before meals 3 times a day (Patient taking differently: 2-8 Units. Inject 2 to 8 units before meals 3 times a day) 10 pen 3   Insulin Pen Needle (BD PEN NEEDLE NANO 2ND GEN) 32G X 4 MM MISC USE 4 TIMES A DAY WITH INSULIN 400 each 3   Molnupiravir 200 MG CAPS Take 4 capsules (800 mg total) by mouth in the morning and at bedtime. 40 capsule 0   vitamin B-12 (CYANOCOBALAMIN) 250 MCG tablet Take 250 mcg by mouth daily.     No current facility-administered medications on file prior to visit.   Allergies  Allergen Reactions   Sulfa Antibiotics    Penicillins Rash   Family History  Problem Relation Age of Onset   Hemochromatosis Father    Alcoholism Father    Hearing loss Brother  otosclerosis   Hypertension Brother    Hyperlipidemia Brother    Stroke Other        grandparent   Hypertension Other        grandparent   Osteoporosis Mother    Depression Mother        dealing with some memory issues   Glaucoma Mother    Diabetes Brother        ? PE as well- she will verify   Hyperlipidemia Brother    PE: BP 118/78 (BP Location: Right Arm, Patient Position: Sitting, Cuff  Size: Normal)   Pulse 68   Ht 5' (1.524 m)   Wt 115 lb 3.2 oz (52.3 kg)   LMP 09/09/2007 (Approximate) Comment: this was with her endometrial ablation  SpO2 98%   BMI 22.50 kg/m  Wt Readings from Last 3 Encounters:  02/19/21 115 lb 3.2 oz (52.3 kg)  01/25/21 110 lb (49.9 kg)  10/16/20 117 lb (53.1 kg)   Constitutional: normal weight, in NAD Eyes: PERRLA, EOMI, no exophthalmos ENT: moist mucous membranes, no thyromegaly, no cervical lymphadenopathy Cardiovascular: RRR, No MRG Respiratory: CTA B Gastrointestinal: abdomen soft, NT, ND, BS+ Musculoskeletal: no deformities, strength intact in all 4 Skin: moist, warm, no rashes Neurological: no tremor with outstretched hands, DTR normal in all 4  ASSESSMENT: 1. LADA, insulin-dependent, controlled, without long-term complications but with occasional hyperglycemia  She had low C-peptide and high GAD antibodies: Component     Latest Ref Rng & Units 03/17/2019  Glucose     65 - 99 mg/dL 192 (H)  C-Peptide     0.80 - 3.85 ng/mL 0.55 (L)   Component     Latest Ref Rng & Units 11/08/2018  Glucose     70 - 99 mg/dL 130 (H)  C-Peptide     0.80 - 3.85 ng/mL 0.55 (L)  Glutamic Acid Decarb Ab     <5 IU/mL >250 (H)  Insulin Antibodies, Human     <0.4 U/mL 3.6 (H)   Component     Latest Ref Rng & Units 11/30/2018  ZNT8 Antibodies     U/mL <15  Islet Cell Ab     Neg:<1:1 Negative   2. HL  3. Hyponatremia  PLAN:  1. Patient with h/o LADA diagnosed in 2020 based on positive GAD antibodies and decreased C-peptide.  She continues on a low glycemic index plant-based diet and she is exercising consistently.  She was able to avoid starting insulin for a long period of time but we ended up adding mealtime insulin in 2020 and long-acting insulin in 2021.  She is very insulin sensitive and uses low doses of insulin.  In 02/2020, we switched from fixed insulin doses to using insulin to carb ratios and a sliding scale.  Also, we were able to  switch from Humalog to Lyumjev, which she loves.  HbA1c is excellent, under 6%.  She had mild low blood sugars in the 50s and 60s and we discussed about using a more permissive insulin to carb ratio with a meal if planning to exercise afterwards.  We also decreased her Basaglar dose as she was having some mild lows overnight and I advised her that she may need to move it in the morning if she continues to have mildly low values overnight. CGM interpretation: -At today's visit, we reviewed her CGM downloads: It appears that 92% of values are in target range (goal >70%), while 7% are higher than 180 (goal <25%), and 1% are lower  than 70 (goal <4%).  The calculated average blood sugar is 124.  The projected HbA1c for the next 3 months (GMI) is 6.3%. -Reviewing the CGM trends, sugars appear to be slightly more fluctuating, but the vast majority of them are at goal.  The only higher blood sugars are actually at night and upon questioning, she has a small snack (peanut butter and jelly) at bedtime when she takes her Basaglar as she is afraid that she may drop her blood sugars under morning.  We discussed that ideally she would not need to have to snack but if she does, to limit it to only peanut butter, without the jelly. -Since her COVID-19 episode, she increased her dose of Lantus to 7-8 units and this dose appears to work well for her and she was able to reduce her Lyumjev doses (relaxed her ICR's) after doing so.  I explained that this is some more physiologic regimen and ideally the Lyumjev and Basaglar total daily doses are approximately similar.  For her, she continues to require more mealtime coverage then basal insulin, but these are now in a better ratio compared to before.  I did advise her that if she starts seeing lower blood sugars to start relaxing her ICRs, more - I advised her to: Patient Instructions  Please continue: - Basaglar 7-8 units at bedtime - Humalog insulin to carb ratio 1:22-30.    Please stop at the lab.  Please return in 4 months.  - we checked her HbA1c: 5.8% (lower) - advised to check sugars at different times of the day - 4x a day, rotating check times - advised for yearly eye exams >> she is UTD - return to clinic in 4 months  2. HL -Reviewed latest lipid panel: All fractions at goal: Lab Results  Component Value Date   CHOL 173 08/16/2020   HDL 102 08/16/2020   LDLCALC 57 08/16/2020   TRIG 49 08/16/2020   CHOLHDL 1.7 08/16/2020  -She is not on a statin but has excellent lipid levels on a plant-based diet  3.  Hyponatremia -She is asymptomatic: Denies headaches, mental fog, disequilibrium.  She did have nausea and fluid retention during her recent COVID-19 episode, but these resolved. 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 -At last visit, I advised her to try to reduce her fluids to 1 L a day for 3 to 4 days before this visit to see if sodium normalizes.  She was able to reduce the fluids to 1.5 L a day.  We discussed that if the sodium level is still normal today, she most likely has SIADH.  If sodium is still low, she may have reset osmostat. -If she has SIADH, will probably proceed with a chest x-ray and cranial imaging (I do not see any such recent imaging tests in her chart) -if she has reset osmostat, we can just follow this -If she has SIADH, best treatment is fluid restriction.  This is usually not easy to tolerate.  Other possible treatments are cumbersome: Demeclocycline, urea, AVP receptor antagonist (vaptans).  - Total time spent for the visit: 40 minutes, in preparing for the visit by obtaining  medical information from the chart, and also for discussion  with the patient, reviewing her  previous labs, imaging evaluations, and treatments, reviewing her symptoms, counseling her about her conditions (please see the discussed topics above), and developing a plan to further investigate and treat them; she had a number of questions which I addressed.  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  We will continue to follow this expectantly.  I will repeat her sodium at next visit.  Philemon Kingdom, MD PhD [facility]

## 2021-02-19 NOTE — Addendum Note (Signed)
Addended by: Lauralyn Primes on: 02/19/2021 04:36 PM   Modules accepted: Orders

## 2021-02-20 ENCOUNTER — Other Ambulatory Visit: Payer: Self-pay | Admitting: Internal Medicine

## 2021-02-20 DIAGNOSIS — E139 Other specified diabetes mellitus without complications: Secondary | ICD-10-CM

## 2021-02-26 ENCOUNTER — Encounter: Payer: Self-pay | Admitting: Internal Medicine

## 2021-03-05 LAB — HM DIABETES EYE EXAM

## 2021-03-05 NOTE — Telephone Encounter (Signed)
Patient called states that her insurance has changed and will cover the [facility] instead of [employer].  Is calling to check status of am RX for the Dexcom.  Sent a message via [place] on 02/26/21 and has not heard anything as of yet.  Please call at [phone]

## 2021-03-07 ENCOUNTER — Other Ambulatory Visit: Payer: Self-pay | Admitting: Internal Medicine

## 2021-03-07 MED ORDER — BD PEN NEEDLE NANO 2ND GEN 32G X 4 MM MISC: 3 refills | Status: DC

## 2021-03-07 MED ORDER — DEXCOM G6 TRANSMITTER MISC: 1.0000 | 3 refills | Status: DC

## 2021-03-07 MED ORDER — DEXCOM G6 SENSOR MISC: 1.0000 | 3 refills | Status: AC

## 2021-03-07 MED ORDER — DEXCOM G6 RECEIVER DEVI: 1.0000 | Freq: Once | 0 refills | Status: AC

## 2021-03-07 NOTE — Telephone Encounter (Signed)
Patient called to make sure that Dr. Cruzita Lederer receives Patient's MyChart response message to Dr. Cruzita Lederer asap.   Patient states this matter is time sensitive.  MyChart response message 03/07/21 at 12:46 pm" Thank you Dr. Cruzita Lederer. I just received a message from [facility] letting me know that they have asked you for more information so that the prescription will be covered by the insurance plan. Unfortunately my current and last freestyle libre two monitor expires tomorrow morning at 9 AM. I will also try my best to limit fluid to 1.5 L per day."

## 2021-03-12 ENCOUNTER — Other Ambulatory Visit: Payer: Self-pay

## 2021-03-13 ENCOUNTER — Telehealth: Payer: Self-pay | Admitting: Pharmacy Technician

## 2021-03-13 NOTE — Telephone Encounter (Signed)
[facility] Patient Advocate Encounter  Prior Authorization for [place] has been approved/RESOLVED.     Rx #: [id number]    [facility] will continue to follow.   Venida Jarvis. Nadara Mustard, CPhT Patient Advocate [facility] Phone: [phone] Fax:  [phone]

## 2021-03-14 ENCOUNTER — Encounter: Payer: Self-pay | Admitting: Family Medicine

## 2021-03-15 ENCOUNTER — Encounter: Payer: Self-pay | Admitting: Internal Medicine

## 2021-06-13 ENCOUNTER — Encounter: Payer: Self-pay | Admitting: Internal Medicine

## 2021-06-18 ENCOUNTER — Encounter: Payer: Self-pay | Admitting: Internal Medicine

## 2021-06-18 ENCOUNTER — Ambulatory Visit: Payer: BC Managed Care – PPO | Admitting: Internal Medicine

## 2021-06-18 ENCOUNTER — Other Ambulatory Visit: Payer: Self-pay

## 2021-06-18 VITALS — BP 110/80 | HR 60 | Ht 60.0 in | Wt 117.6 lb

## 2021-06-18 DIAGNOSIS — E871 Hypo-osmolality and hyponatremia: Secondary | ICD-10-CM

## 2021-06-18 DIAGNOSIS — E139 Other specified diabetes mellitus without complications: Secondary | ICD-10-CM | POA: Diagnosis not present

## 2021-06-18 DIAGNOSIS — E785 Hyperlipidemia, unspecified: Secondary | ICD-10-CM | POA: Diagnosis not present

## 2021-06-18 LAB — POCT GLYCOSYLATED HEMOGLOBIN (HGB A1C): Hemoglobin A1C: 5.7 % — AB (ref 4.0–5.6)

## 2021-06-18 MED ORDER — NOVOPEN ECHO DEVI: 1.0000 | Freq: Once | 1 refills | Status: AC

## 2021-06-18 MED ORDER — ACCU-CHEK GUIDE CONTROL VI LIQD: 11 refills | Status: AC

## 2021-06-18 MED ORDER — FIASP PENFILL 100 UNIT/ML ~~LOC~~ SOCT
SUBCUTANEOUS | 11 refills | Status: DC
Start: 2021-06-18 — End: 2022-06-24

## 2021-06-18 MED ORDER — BAQSIMI TWO PACK 3 MG/DOSE NA POWD: NASAL | 99 refills | Status: DC

## 2021-06-18 NOTE — Patient Instructions (Addendum)
Please change: - Basaglar 6 units in am and 3 units at bedtime  Please change to FiAsp: - insulin to carb ratio 1:22-30.   Try to do dual wave boluses as discussed.  Please return in 4 months.

## 2021-06-18 NOTE — Progress Notes (Addendum)
Patient ID: Brittany Barton, female   DOB: 14-Jul-1961, 60 y.o.   MRN: [id number]   This visit occurred during the SARS-CoV-2 public health emergency.  Safety protocols were in place, including screening questions prior to the visit, additional usage of staff PPE, and extensive cleaning of exam room while observing appropriate contact time as indicated for disinfecting solutions.   HPI: Brittany Barton is a 60 y.o.-year-old female, initially referred by her PCP, Dr. 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  Last visit 4 months ago.  Interim history: No increased urination, blurry vision, nausea, chest pain.  Reviewed HbA1c levels: Lab Results  Component Value Date   HGBA1C 5.8 (A) 02/19/2021   HGBA1C 5.9 (A) 10/16/2020   HGBA1C 5.8 (A) 06/12/2020   HGBA1C 5.7 (A) 02/08/2020   HGBA1C 5.7 (A) 10/20/2019   HGBA1C 5.6 06/20/2019   HGBA1C 7.3 (A) 03/17/2019   HGBA1C 6.6 (H) 11/08/2018   HGBA1C 6.0 08/04/2018   HGBA1C 5.4 03/09/2018   She is on: - Basaglar- added 10/2019: 4-6 ...>> 3-4 >> 7-8 units at bedtime >> 6 units in am and 4 units at bedtime (Levemir is covered) - Humalog >> Lyumjev - insulin to carb ratio 1:16-20 >> 1:22.  If exercising after the meal, 1:30 for that meal. >> currently:  Does not usually bolus for coffee.  She checks her sugars more than 4 times a day with her freestyle libre CGM:   Previously   Previously:   Lowest sugar was 40 >> .Marland Kitchen. 60s >> 50s; she has hypoglycemia awareness at 70. Highest sugar was 350 (COVID infection) >> 200s  Glucometer: Accuchek   She continues on a plant-based diet without high glycemic index foods. In 02/2015 she started a plant-based diet and lost more than 80 pounds.  Pt's meals are: - Breakfast: Raw/soaked oatmeal, blueberries or other fruit, ground flaxseed, walnuts (2 tablespoons), soy  milk - Lunch: Beans, whole grains, leafy greens, other vegetables and fruit - Dinner: Salad with beans/peas or vegetable-based soup. - Snacks: 0-2-fruit, veggies, homemade bean dip, PB  -No CKD, last BUN/creatinine:  Lab Results  Component Value Date   BUN 8 02/19/2021   BUN 8 08/16/2020   CREATININE 0.52 02/19/2021   CREATININE 0.50 08/16/2020  Not on ACE inhibitor/ARB.  -+ History of HL, diet controlled; last set of lipids: Lab Results  Component Value Date   CHOL 173 08/16/2020   HDL 102 08/16/2020   LDLCALC 57 08/16/2020   TRIG 49 08/16/2020   CHOLHDL 1.7 08/16/2020  No statins.  - last eye exam was 02/2021: No DR  - NO numbness and tingling in her feet.  Pt has FH of DM in MGM.  Hyponatremia: She has a history of higher potassium and low sodium: Lab Results  Component Value Date   NA 133 (L) 02/19/2021   NA 130 (L) 08/16/2020   NA 126 (L) 08/29/2019   NA 126 (L) 08/15/2019   NA 130 (L) 03/17/2019   NA 133 (L) 11/08/2018   NA 129 (L) 08/04/2018   NA 134 (L) 08/06/2017   NA 134 (L) 07/29/2016   NA 136 07/31/2015   NA 139 05/24/2013   Lab Results  Component Value Date   K 4.3 02/19/2021   K 4.6 08/16/2020   K 4.0 08/29/2019   K 4.5 08/15/2019   K 4.2 03/17/2019   K 5.7 (H) 11/08/2018  K 3.8 08/04/2018   K 4.0 08/06/2017   K 4.1 07/29/2016   K 4.1 07/31/2015   Investigation for hyponatremia reviewed: Component     Latest Ref Rng & Units 08/16/2020  Glucose     65 - 99 mg/dL 60 (L)  BUN     7 - 25 mg/dL 8  Creatinine     0.50 - 0.99 mg/dL 0.50  GFR, Est Non African American     > OR = 60 mL/min/1.32m2 105  Sodium     135 - 146 mmol/L 130 (L)  Potassium     3.5 - 5.3 mmol/L 4.6  Chloride     98 - 110 mmol/L 94 (L)  Osmolality, Urine     50 - 1,200 mOsm/kg 142  Osmolality     278 - 305 mOsm/kg 268 (L)  Sodium, Urine     28 - 272 mmol/L 33   Plasma sodium was 130, slightly low >> hyponatremia Plasma osmolality was 268, lower than 280 >>  hypoosmolar hyponatremia Urine sodium was 33, not very low >> ruling out excessive fluid losses (chronic diuretics, GI losses, excessive perspiration) or water overload Urine osmolality was 142, higher than 100 >> inappropriately high for hyponatremia  We ruled out adrenal insufficiency by a normal cosyntropin stimulation test: Component     Latest Ref Rng & Units 11/30/2018 11/30/2018 11/30/2018         8:15 AM  8:59 AM  9:26 AM  Cortisol, Plasma     ug/dL 16.8 24.2 28.0  C206 ACTH     6 - 50 pg/mL 15     Also, she does not have a history of hypothyroidism: Lab Results  Component Value Date   TSH 1.07 11/08/2018   TSH 1.36 07/29/2016   TSH 2.41 07/31/2015   She is not on HCTZ, carbamazepine, amitriptyline, SSRIs, opiates. No history of brain injury. No history of generalized infections. No history of malignancy. No recent brain or chest imaging. No chronic kidney disease.  At previous visits, I advised her to try to reduce her fluid intake to 1 L a day.  She was able to limit her intake to 1.5 L a day, but this was difficult for her to do.  She had a lot of fluid retention during her COVID-19 episode from 01/2021 and after taking the antiviral medication she eliminated this (approximately 7 to 8 pounds).  Mother also has a history of hyponatremia. Brother with otosclerosis.  ROS: + See HPI  I reviewed pt's medications, allergies, PMH, social hx, family hx, and changes were documented in the history of present illness. Otherwise, unchanged from my initial visit note.  Past Medical History:  Diagnosis Date   Chicken pox    Depression    Resolved. in 105s- meds and counseling.   Family history of hemochromatosis    father- screen iron   Gestational diabetes 1987   a1c q3 years   Hammer toe    Morton's neuroma    per records   Past Surgical History:  Procedure Laterality Date   ENDOMETRIAL ABLATION  2010   heavy menstrual bleeding   TONSILLECTOMY AND ADENOIDECTOMY   1965   Social History   Socioeconomic History   Marital status: Married    Spouse name: Not on file   Number of children: 2   Years of education: Not on file   Highest education level: Not on file  Occupational History    Librarian  Social Needs   Financial  resource strain: Not on file   Food insecurity:    Worry: Not on file    Inability: Not on file   Transportation needs:    Medical: Not on file    Non-medical: Not on file  Tobacco Use   Smoking status: Never Smoker   Smokeless tobacco: Never Used  Substance and Sexual Activity   Alcohol use: Yes    Alcohol/week:  2-4 drinks per week    Types:  Wine/beer   Drug use: No   Sexual activity: Yes  Lifestyle   Physical activity: Walk/hike/spin class/weight resistance/stretching    Days per week: 7    Minutes per session: Not on file  Relationships  Social History Narrative   Married. (husband Jeneen Rinks, [name]). 2 children grown and married. B/c grandmother December 2016.    Has had 56 moves in 40 years, most recently [place] for 1.5 years, [place] 5 years prior, [place] prior      [place]. In nutrition. Masters in [employer]. In between jobs as Licensed conveyancer.       Current Outpatient Medications on File Prior to Visit  Medication Sig Dispense Refill   Accu-Chek FastClix Lancets MISC Use as instructed 204 each 3   acyclovir ointment (ZOVIRAX) 5 % Apply 1 application topically every 3 (three) hours. 30 g 1   BAQSIMI TWO PACK 3 MG/DOSE POWD      Cholecalciferol 50 MCG (2000 UT) CAPS Take 1 capsule by mouth daily.     Continuous Blood Gluc Transmit (DEXCOM G6 TRANSMITTER) MISC 1 Device by Does not apply route every 3 (three) months. 1 each 3   glucose blood test strip Use as instructed to check blood sugar 4 times a day 150 each 3   Insulin Glargine (BASAGLAR KWIKPEN) 100 UNIT/ML INJECT 0.04-0.06 MLS (4-6 UNITS TOTAL) INTO THE SKIN DAILY. (Patient taking differently: Inject 7-8 Units into the skin daily.) 6 mL  1   Insulin Lispro-aabc, 1 U Dial, (LYUMJEV KWIKPEN) 100 UNIT/ML SOPN Inject 3 to 10 units before meals 3 times a day (Patient taking differently: 2-8 Units. Inject 2 to 8 units before meals 3 times a day) 10 pen 3   Insulin Pen Needle (BD PEN NEEDLE NANO 2ND GEN) 32G X 4 MM MISC USE 4 TIMES A DAY WITH INSULIN 400 each 3   Molnupiravir 200 MG CAPS Take 4 capsules (800 mg total) by mouth in the morning and at bedtime. 40 capsule 0   vitamin B-12 (CYANOCOBALAMIN) 250 MCG tablet Take 250 mcg by mouth daily.     No current facility-administered medications on file prior to visit.   Allergies  Allergen Reactions   Sulfa Antibiotics    Penicillins Rash   Family History  Problem Relation Age of Onset   Hemochromatosis Father    Alcoholism Father    Hearing loss Brother        otosclerosis   Hypertension Brother    Hyperlipidemia Brother    Stroke Other        grandparent   Hypertension Other        grandparent   Osteoporosis Mother    Depression Mother        dealing with some memory issues   Glaucoma Mother    Diabetes Brother        ? PE as well- she will verify   Hyperlipidemia Brother    PE: BP 110/80 (BP Location: Right Arm, Patient Position: Sitting, Cuff Size: Normal)   Pulse 60  Ht 5' (1.524 m)   Wt 117 lb 9.6 oz (53.3 kg)   LMP 09/09/2007 (Approximate) Comment: this was with her endometrial ablation  SpO2 100%   BMI 22.97 kg/m  Wt Readings from Last 3 Encounters:  06/18/21 117 lb 9.6 oz (53.3 kg)  02/19/21 115 lb 3.2 oz (52.3 kg)  01/25/21 110 lb (49.9 kg)   Constitutional: normal weight, in NAD Eyes: PERRLA, EOMI, no exophthalmos ENT: moist mucous membranes, no thyromegaly, no cervical lymphadenopathy Cardiovascular: RRR, No MRG Respiratory: CTA B Gastrointestinal: abdomen soft, NT, ND, BS+ Musculoskeletal: no deformities, strength intact in all 4 Skin: moist, warm, no rashes Neurological: no tremor with outstretched hands, DTR normal in all  4  ASSESSMENT: 1. LADA, insulin-dependent, controlled, without long-term complications but with occasional hyperglycemia  She had low C-peptide and high GAD antibodies: Component     Latest Ref Rng & Units 03/17/2019  Glucose     65 - 99 mg/dL 192 (H)  C-Peptide     0.80 - 3.85 ng/mL 0.55 (L)   Component     Latest Ref Rng & Units 11/08/2018  Glucose     70 - 99 mg/dL 130 (H)  C-Peptide     0.80 - 3.85 ng/mL 0.55 (L)  Glutamic Acid Decarb Ab     <5 IU/mL >250 (H)  Insulin Antibodies, Human     <0.4 U/mL 3.6 (H)   Component     Latest Ref Rng & Units 11/30/2018  ZNT8 Antibodies     U/mL <15  Islet Cell Ab     Neg:<1:1 Negative   2. HL  3. Hyponatremia  PLAN:  1. Patient with history of LADA diagnosed in 2020 based on positive GAD antibodies and decreased C-peptide.  She continues on a low glycemic index, plant-based diet and she is exercising consistently.  She was able to avoid starting insulin for a long period of time but we ended up adding mealtime insulin in 2020 and long-acting insulin 2021.  She is very insulin sensitive and uses low doses of insulin.  In 02/2020, we switched from fixed insulin doses to using insulin to carb ratios and a sliding scale.  We were also able to switch from Humalog to Lyumjev, which she loves.  HbA1c at last visit was excellent, at 5.8%, improved.  At that time, sugars appeared to be slightly more fluctuating, but the vast majority of them are at goal.  The only higher blood sugars were at night and upon questioning, she had a small snack, grabbing about eventually) at bedtime when she was taking her Basaglar to avoid dropping her blood sugars in the middle of the night.  We discussed that she can skip the snack or limited to only peanut butter, without the jelly.  We did not change her regimen at that time. CGM interpretation: -At today's visit, we reviewed her CGM downloads: It appears that 73.3% of values are in target range (goal >70%), while  16.6% are higher than 180 (goal <25%), and 10.1% are lower than 70 (goal <4%).  The calculated average blood sugar is 126.  The projected HbA1c for the next 3 months (GMI) is 6.3%. -Reviewing the CGM trends, it appears that her sugars are little bit more fluctuating in the last 2 weeks compared to the previous 2 weeks.  She has been out of town and had higher blood sugars, especially after lunch due to eating slightly more fatty foods.  We discussed that in the situations, she may benefit  from a dual wave bolus.  I explained why this is done and how to do it.  I advised her to try to split the dose 50-50 in the situations and may need to change the ratios between the 2 boluses.  She agrees to try to do so.  Also, she has situations in which she is bolusing too much before meals and sugars may decrease after the meals.  Since she is afraid about developing hypoglycemia in the situations, she sometimes backs of the bolus but the intensity), the bolus may not be sufficient.  Therefore, I recommended to switch to a Novo ECHO pen that uses cartridges, which is able to give her half units.  I sent these to her pharmacy.  I am hoping that these are covered. -She likes the fact that she can inject Lyumjev right before the meals, however, per review of the formulary for her insurance, Lyumjev is not covered anymore.  We will try to switch to [facility], which appears to be covered.  Basaglar pens also do not appear to be covered, so we may need to switch to Levemir in the future. -Since last visit, since she was dropping her sugars too much overnight, she split the Basaglar dose and if she is taking 60% of the dose in the morning and 40% at night.  However, when her sugars are at goal at night, per review of the CGM tracings, she is dropping her sugars too much in the early morning hours so we discussed about backing off the nighttime Basaglar. -We also discussed her diet, which is very healthy.  She is having healthy fats  with meals, but she is not happy about several pounds of weight gain.  She continues to be active and she is adjusting the doses of insulin based on activity.  We will continue this practice. - I advised her to: Patient Instructions  Please change: - Basaglar 6 units in am and 3 units at bedtime  Please change to FiAsp: - insulin to carb ratio 1:22-30.   Try to do dual wave boluses as discussed.  Please return in 4 months.  - we checked her HbA1c: 5.7% (excellent)  - advised to check sugars at different times of the day - 4x a day, rotating check times - advised for yearly eye exams >> she is UTD - return to clinic in 4 months  2. HL -Reviewed latest lipid panel: All fractions at goal: Lab Results  Component Value Date   CHOL 173 08/16/2020   HDL 102 08/16/2020   LDLCALC 57 08/16/2020   TRIG 49 08/16/2020   CHOLHDL 1.7 08/16/2020  -She is not on a statin but has excellent lipid levels on the plant-based diet  3.  Hyponatremia -Asymptomatic: Denies headaches, mental fog, disequilibrium.  She did have nausea and fluid retention during her COVID-19 episode before last visit, but this resolved -Her sodium fluctuates between 126 and 133, with the latter at last visit.  This is only mildly low and can just be followed. 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  If sodium is still low, she may have reset osmostat.  Since sodium was still slightly low, it is is most likely that she has reset osmole stat, which can be just followed. -For  now, since she is asymptomatic and latest sodium was very close to the lower limit of normal, we will not repeat the level  - Total time spent for the visit: 40 min, in obtaining medical information from the pt and from the chart, reviewing her  previous labs, treatments, formulary options, reviewing her symptoms, counseling her about her conditions (please see the discussed topics above), and developing a plan to further  treat it; she had a number of questions which I addressed.  Philemon Kingdom, MD PhD [facility]

## 2021-06-24 ENCOUNTER — Other Ambulatory Visit: Payer: Self-pay

## 2021-06-24 MED ORDER — BASAGLAR KWIKPEN 100 UNIT/ML ~~LOC~~ SOPN: 4.0000 [IU] | PEN_INJECTOR | Freq: Every day | SUBCUTANEOUS | 1 refills | Status: DC

## 2021-06-24 MED ORDER — BD PEN NEEDLE NANO 2ND GEN 32G X 4 MM MISC: 3 refills | Status: DC

## 2021-06-24 NOTE — Telephone Encounter (Signed)
Script sent  

## 2021-06-25 ENCOUNTER — Encounter: Payer: Self-pay | Admitting: Internal Medicine

## 2021-06-25 ENCOUNTER — Other Ambulatory Visit: Payer: Self-pay | Admitting: Internal Medicine

## 2021-06-25 MED ORDER — BD PEN NEEDLE NANO 2ND GEN 32G X 4 MM MISC: 3 refills | Status: DC

## 2021-06-25 MED ORDER — BASAGLAR KWIKPEN 100 UNIT/ML ~~LOC~~ SOPN: PEN_INJECTOR | SUBCUTANEOUS | 3 refills | Status: DC

## 2021-08-14 NOTE — Progress Notes (Signed)
Phone [phone]   Subjective:  Patient presents today for their annual physical. Chief complaint-noted.   See problem oriented charting- ROS- full  review of systems was completed and negative except for: trouble staying asleep- wakes up to pee once or twice a night- occasionally 3 but falls back asleep  The following were reviewed and entered/updated in epic: Past Medical History:  Diagnosis Date   Chicken pox    Depression    Resolved. in 66s- meds and counseling.   Family history of hemochromatosis    father- screen iron   Gestational diabetes 1987   a1c q3 years   Hammer toe    Morton's neuroma    per records   Patient Active Problem List   Diagnosis Date Noted   LADA (latent autoimmune diabetes of adulthood) ([place]) 11/23/2018    Priority: High   Hyperlipidemia 08/04/2016    Priority: Medium    Fever blister 08/04/2016    Priority: Low   Depression     Priority: Low   Family history of hemochromatosis     Priority: Low   COVID-19 virus infection 01/25/2021   Hyponatremia 10/16/2020   Past Surgical History:  Procedure Laterality Date   ENDOMETRIAL ABLATION  2010   heavy menstrual bleeding   TONSILLECTOMY AND ADENOIDECTOMY  1965    Family History  Problem Relation Age of Onset   Hemochromatosis Father    Alcoholism Father    Hearing loss Brother        otosclerosis   Hypertension Brother    Hyperlipidemia Brother    Stroke Other        grandparent   Hypertension Other        grandparent   Osteoporosis Mother    Depression Mother        dealing with some memory issues   Glaucoma Mother    Diabetes Brother        ? PE as well- she will verify   Hyperlipidemia Brother     Medications- reviewed and updated Current Outpatient Medications  Medication Sig Dispense Refill   Accu-Chek FastClix Lancets MISC Use as instructed 204 each 3   acyclovir ointment (ZOVIRAX) 5 % Apply 1 application topically every 3 (three) hours. 30 g 1   BAQSIMI TWO PACK  3 MG/DOSE POWD Use as needed for hypoglycemia 1 each PRN   Blood Glucose Calibration (ACCU-CHEK GUIDE CONTROL) LIQD To calibrate Accu-Chek Guide meter 1 each 11   Cholecalciferol 50 MCG (2000 UT) CAPS Take 1 capsule by mouth daily.     Continuous Blood Gluc Transmit (DEXCOM G6 TRANSMITTER) MISC 1 Device by Does not apply route every 3 (three) months. 1 each 3   glucose blood test strip Use as instructed to check blood sugar 4 times a day 150 each 3   Insulin Aspart, w/Niacinamide, (FIASP PENFILL) 100 UNIT/ML SOCT Use up to 20 units a day as advised 15 mL 11   Insulin Glargine (BASAGLAR KWIKPEN) 100 UNIT/ML Inject 6 units in a.m. and 3 units at bedtime under skin, as advised 30 mL 3   Insulin Pen Needle (BD PEN NEEDLE NANO 2ND GEN) 32G X 4 MM MISC USE 6-8 TIMES A DAY FOR INSULIN 500 each 3   vitamin B-12 (CYANOCOBALAMIN) 250 MCG tablet Take 250 mcg by mouth daily.     No current facility-administered medications for this visit.    Allergies-reviewed and updated Allergies  Allergen Reactions   Sulfa Antibiotics    Penicillins Rash  Social History   Social History Narrative   Married. (husband [name] of Dr. Yong Channel). 2 children grown and married.   Son - 2 kids including oldest [place] [id number], son is 13 years old   Daughter- 9 year old granddaughter  youngest 2 in 2020 premature 2 months early (5 months old identical twin girls) 08/15/2019         Has had 28 moves in 104 years, most recently [place] for 1.5 years, [place] 5 years prior, [place] prior      B.A. In nutrition. Masters in [employer].  Has worked as a Licensed conveyancer.      Objective  Objective:  BP 100/70   Pulse 64   Temp 98.5 F (36.9 C)   Ht 5' (1.524 m)   Wt 119 lb 9.6 oz (54.3 kg)   LMP 09/09/2007 (Approximate) Comment: this was with her endometrial ablation  SpO2 98%   BMI 23.36 kg/m  Gen: NAD, resting comfortably HEENT: Mucous membranes are moist. Oropharynx normal Neck: no thyromegaly CV:  RRR no murmurs rubs or gallops Lungs: CTAB no crackles, wheeze, rhonchi Abdomen: soft/nontender/nondistended/normal bowel sounds. No rebound or guarding. CGM in place- dexcom Ext: no edema Skin: warm, dry Neuro: grossly normal, moves all extremities, PERRLA    Diabetic Foot Exam - Simple   Simple Foot Form Diabetic Foot exam was performed with the following findings: Yes 08/21/2021  9:46 AM  Visual Inspection No deformities, no ulcerations, no other skin breakdown bilaterally: Yes Sensation Testing Intact to touch and monofilament testing bilaterally: Yes Pulse Check Posterior Tibialis and Dorsalis pulse intact bilaterally: Yes Comments        Assessment and Plan   60 y.o. female presenting for annual physical.  Health Maintenance counseling: 1. Anticipatory guidance: Patient counseled regarding regular dental exams -q6 months, eye exams -yearly may switch to optho,  avoiding smoking and second hand smoke , limiting alcohol to 1 beverage per day- has done 2 months no alcohol- may still have glass of wine . No illicit drugs   2. Risk factor reduction:  Advised patient of need for regular exercise and diet rich and fruits and vegetables to reduce risk of heart attack and stroke.   Exercise- doing some exercise on daily basis for most part Diet/management- eats plant based diet - exclusively  Wt Readings from Last 3 Encounters:  08/21/21 119 lb 9.6 oz (54.3 kg)  06/18/21 117 lb 9.6 oz (53.3 kg)  02/19/21 115 lb 3.2 oz (52.3 kg)  3. Immunizations/screenings/ancillary studies DISCUSSED:  -Prevnar-20 vaccine #2- discussed option of doing this due to diabetes- we will reconsider in future years- opted out for now -COVID booster vaccine #5 - wants to hold off for now -Flu vaccine (last one 10/20) - today Immunization History  Administered Date(s) Administered   Influenza,inj,Quad PF,6+ Mos 07/31/2015, 08/04/2016, 08/05/2017, 08/11/2018, 06/20/2019   PFIZER(Purple Top)SARS-COV-2  Vaccination 11/18/2019, 12/09/2019, 06/07/2020   Pneumococcal Polysaccharide-23 03/09/2018   Tdap 07/31/2015   Zoster Recombinat (Shingrix) 03/09/2018, 08/11/2018  4. Cervical cancer screening- pap smear 08/19/18 w/ 3 year repeat planned with GYN 5. Breast cancer screening-  breast exam with GYN typically and mammogram 08/11/18 with 1 year repeat planned typically- she prefers 2 - agrees to at least do this year 6. Colon cancer screening -colonoscopy 10/10/10 with 10 year repeat planned -now due for repeat-denies history of polyps and request Cologuard instead especially concerned about glucose regulation during prep  7. Skin cancer screening- yearly previously- q 5 years now. advised regular  sunscreen use. Denies worrisome, changing, or new skin lesions.  8. Birth control/STD check- postmenopausal and monogamous 9. Osteoporosis screening at 65- discussed option of bone density before  64 as well- she prefers to wait  -Never smoker  Status of chronic or acute concerns   # Hx of COVID - positive for covid-symptoms started 3.5 days before video visit with Dr.Letvak on 01/25/21. Sugar dysregulation was the most frustrating part.   #LADA- S: follows closely with Dr. Cruzita Lederer.  Last visit on 06/18/2021.  Uses continuous glucose monitoring. Has had to split her basal insulin Lab Results  Component Value Date   HGBA1C 5.7 (A) 06/18/2021   HGBA1C 5.8 (A) 02/19/2021   HGBA1C 5.9 (A) 10/16/2020   A/P: A1c has been excellent-continue current medications and close follow-up with endocrinology  #Hyponatremia-has been encouraged to fluid restrict per Dr. Dedra Skeens mild SIADH   #uterine prolapse- using pessary.  Opts out surgery for now wanted space to care for grandkids and her parents but still strongly considering - able to complete 4 METS of activity without chest pain or SOB would not need further clearance.    #hyperlipidemia technically related to diabetes S: Medication: none . Has cut back  on peanut butter so thinks may be better.  Lab Results  Component Value Date   CHOL 173 08/16/2020   HDL 102 08/16/2020   LDLCALC 57 08/16/2020   TRIG 49 08/16/2020   CHOLHDL 1.7 08/16/2020   A/P: LDL has been excellent - We have discussed in the past guidelines suggesting statin for diabetes regardless of LDL level-she prefers to continue to work on diet and exercise- exclusive plant based diet has controlled this well - We did discuss CT cardiac scoring option today - we opted to consider if #s increase   # Depression S: Medication: none- remains in full remission off meds -- seen therapist to help with dealing with alcoholic father and mom with dementia and found that helpful. Seen Aleen Sells in [place]- no seeing currently A/P: Continues to do well-full remission-continue current medication  Recommended follow up: Return in about 1 year (around 08/21/2022) for physical or sooner if needed. . Future Appointments  Date Time Provider [place]  10/21/2021  9:20 AM Philemon Kingdom, MD [facility] None   Lab/Order associations: Not fasting   ICD-10-CM   1. Preventative health care  Z00.00 Microalbumin / creatinine urine ratio    CBC with Differential/Platelet    Comprehensive metabolic panel    Lipid panel    2. Hyperlipidemia, unspecified hyperlipidemia type  E78.5 CBC with Differential/Platelet    Comprehensive metabolic panel    Lipid panel    3. Major depressive disorder with single episode, in full remission ([place])  F32.5     4. LADA (latent autoimmune diabetes of adulthood) ([place])  E13.9 Microalbumin / creatinine urine ratio    5. Uterine prolapse  N81.4     6. Encounter for screening mammogram for malignant neoplasm of breast  Z12.31 MM Digital Screening    7. Screen for colon cancer  Z12.11 Cologuard      No orders of the defined types were placed in this encounter.   I,Jada Bradford,acting as a scribe for Garret Reddish, MD.,have documented all  relevant documentation on the behalf of Garret Reddish, MD,as directed by  Garret Reddish, MD while in the presence of Garret Reddish, MD.   I, Garret Reddish, MD, have reviewed all documentation for this visit. The documentation on 08/21/21 for the exam, diagnosis, procedures,  and orders are all accurate and complete.   Return precautions advised.  Garret Reddish, MD

## 2021-08-21 ENCOUNTER — Other Ambulatory Visit: Payer: Self-pay

## 2021-08-21 ENCOUNTER — Ambulatory Visit (INDEPENDENT_AMBULATORY_CARE_PROVIDER_SITE_OTHER): Payer: BC Managed Care – PPO | Admitting: Family Medicine

## 2021-08-21 ENCOUNTER — Encounter: Payer: Self-pay | Admitting: Family Medicine

## 2021-08-21 VITALS — BP 100/70 | HR 64 | Temp 98.5°F | Ht 60.0 in | Wt 119.6 lb

## 2021-08-21 DIAGNOSIS — Z1231 Encounter for screening mammogram for malignant neoplasm of breast: Secondary | ICD-10-CM

## 2021-08-21 DIAGNOSIS — E785 Hyperlipidemia, unspecified: Secondary | ICD-10-CM | POA: Diagnosis not present

## 2021-08-21 DIAGNOSIS — F325 Major depressive disorder, single episode, in full remission: Secondary | ICD-10-CM | POA: Diagnosis not present

## 2021-08-21 DIAGNOSIS — Z8616 Personal history of COVID-19: Secondary | ICD-10-CM

## 2021-08-21 DIAGNOSIS — Z23 Encounter for immunization: Secondary | ICD-10-CM

## 2021-08-21 DIAGNOSIS — N814 Uterovaginal prolapse, unspecified: Secondary | ICD-10-CM

## 2021-08-21 DIAGNOSIS — Z0000 Encounter for general adult medical examination without abnormal findings: Secondary | ICD-10-CM | POA: Diagnosis not present

## 2021-08-21 DIAGNOSIS — E139 Other specified diabetes mellitus without complications: Secondary | ICD-10-CM

## 2021-08-21 DIAGNOSIS — Z1211 Encounter for screening for malignant neoplasm of colon: Secondary | ICD-10-CM

## 2021-08-21 LAB — COMPREHENSIVE METABOLIC PANEL
ALT: 14 U/L (ref 0–35)
AST: 19 U/L (ref 0–37)
Albumin: 4.2 g/dL (ref 3.5–5.2)
Alkaline Phosphatase: 64 U/L (ref 39–117)
BUN: 8 mg/dL (ref 6–23)
CO2: 29 mEq/L (ref 19–32)
Calcium: 9.3 mg/dL (ref 8.4–10.5)
Chloride: 95 mEq/L — ABNORMAL LOW (ref 96–112)
Creatinine, Ser: 0.54 mg/dL (ref 0.40–1.20)
GFR: 99.45 mL/min (ref >60.00)
Glucose, Bld: 110 mg/dL — ABNORMAL HIGH (ref 70–99)
Potassium: 4.8 mEq/L (ref 3.5–5.1)
Sodium: 129 mEq/L — ABNORMAL LOW (ref 135–145)
Total Bilirubin: 0.5 mg/dL (ref 0.2–1.2)
Total Protein: 6.6 g/dL (ref 6.0–8.3)

## 2021-08-21 LAB — CBC WITH DIFFERENTIAL/PLATELET
Basophils Absolute: 0 10*3/uL (ref 0.0–0.1)
Basophils Relative: 0.5 % (ref 0.0–3.0)
Eosinophils Absolute: 0 10*3/uL (ref 0.0–0.7)
Eosinophils Relative: 0.8 % (ref 0.0–5.0)
HCT: 38.5 % (ref 36.0–46.0)
Hemoglobin: 13 g/dL (ref 12.0–15.0)
Lymphocytes Relative: 27.1 % (ref 12.0–46.0)
Lymphs Abs: 0.8 10*3/uL (ref 0.7–4.0)
MCHC: 33.8 g/dL (ref 30.0–36.0)
MCV: 90.6 fl (ref 78.0–100.0)
Monocytes Absolute: 0.3 10*3/uL (ref 0.1–1.0)
Monocytes Relative: 10.6 % (ref 3.0–12.0)
Neutro Abs: 1.8 10*3/uL (ref 1.4–7.7)
Neutrophils Relative %: 61 % (ref 43.0–77.0)
Platelets: 154 10*3/uL (ref 150.0–400.0)
RBC: 4.25 Mil/uL (ref 3.87–5.11)
RDW: 12.8 % (ref 11.5–15.5)
WBC: 3 10*3/uL — ABNORMAL LOW (ref 4.0–10.5)

## 2021-08-21 LAB — MICROALBUMIN / CREATININE URINE RATIO
Creatinine,U: 14.7 mg/dL
Microalb Creat Ratio: 4.8 mg/g (ref 0.0–30.0)
Microalb, Ur: <0.7 mg/dL (ref 0.0–1.9)

## 2021-08-21 LAB — LIPID PANEL
Cholesterol: 149 mg/dL (ref 0–200)
HDL: 78.2 mg/dL (ref >39.00)
LDL Cholesterol: 62 mg/dL (ref 0–99)
NonHDL: 70.87
Total CHOL/HDL Ratio: 2
Triglycerides: 45 mg/dL (ref 0.0–149.0)
VLDL: 9 mg/dL (ref 0.0–40.0)

## 2021-08-21 NOTE — Patient Instructions (Addendum)
Health Maintenance Due  Topic Date Due   MAMMOGRAM - [place] Schedule an appointment by calling [phone].  08/11/2020   COLONOSCOPY (Pts 45-72yrs Insurance coverage will need to be confirmed) -  Should receive your cologuard in 2 weeks. Please let us know if you have not received.   10/10/2020   COVID-19 Vaccine (5 - Booster for [employer] series)- Please consider getting your bivalent booster at your local pharmacy.If received, please let us know.   02/18/2021   INFLUENZA VACCINE- Team please give regular dose flu shot today in office.   04/08/2021   PAP SMEAR-Modifier- Call to schedule your gynecology appointment today. 08/19/2021   Several good ophthalmology groups  in town! [facility], [facility], [facility] if you want to switch from optometry   Please stop by lab before you go If you have mychart- we will send your results within 3 business days of [place] receiving them.  If you do not have mychart- we will call you about results within 5 business days of [place] receiving them.  *please also note that you will see labs on mychart as soon as they post. I will later go in and write notes on them- will say "notes from Dr. Yong Channel"  Recommended follow-up: 1 year (around 08/21/2022) for physical or sooner if needed.   Happy Holidays!

## 2021-08-29 ENCOUNTER — Other Ambulatory Visit: Payer: Self-pay | Admitting: Family Medicine

## 2021-08-29 DIAGNOSIS — Z1231 Encounter for screening mammogram for malignant neoplasm of breast: Secondary | ICD-10-CM

## 2021-09-14 LAB — COLOGUARD: COLOGUARD: POSITIVE — AB

## 2021-09-16 ENCOUNTER — Other Ambulatory Visit: Payer: Self-pay

## 2021-09-16 ENCOUNTER — Encounter: Payer: Self-pay | Admitting: Gastroenterology

## 2021-09-16 DIAGNOSIS — R195 Other fecal abnormalities: Secondary | ICD-10-CM

## 2021-09-26 ENCOUNTER — Ambulatory Visit: Payer: BC Managed Care – PPO

## 2021-10-08 ENCOUNTER — Ambulatory Visit
Admission: RE | Admit: 2021-10-08 | Discharge: 2021-10-08 | Disposition: A | Discharge status: Home / Self Care | Payer: BC Managed Care – PPO | Source: Ambulatory Visit | Attending: Family Medicine | Admitting: Family Medicine

## 2021-10-08 DIAGNOSIS — Z1231 Encounter for screening mammogram for malignant neoplasm of breast: Secondary | ICD-10-CM

## 2021-10-14 NOTE — Progress Notes (Signed)
61 y.o. G7P0 Married Caucasian female here for annual exam.    Thinks she is ready to proceed with surgery for her prolapse.  Prolapse occurring over the pessary.  Using pessary every day and takes it out at night. Noticed some blood today after she had a BM. States this was coming from the vagina.  Bleeding occurred a handful of times.  No pinching or discomfort with pessary removal.   Can void and have a BM ok.  Some urgency with both voiding and BMs.  No leak with cough, laugh, or sneeze.   Will have a colonoscopy in 2 weeks due to positive Cologuard.  Her A1C was 5.7 on 06/18/21.   Plant based diet.   PCP:   Garret Reddish, MD Endocrinology:  Dr. Cruzita Lederer.   Patient's last menstrual period was 09/09/2007 (approximate).           Sexually active: Yes.    The current method of family planning is post menopausal status.    Exercising: Yes.     Walking, resistance, stairs, stretches, hiking Smoker:  no  Health Maintenance: Pap:  08-19-18 Neg:Neg HR HPV, 08-05-17 Neg, 07-11-15 Neg History of abnormal Pap:  no MMG:  10-08-21 Neg/Birads1 Colonoscopy:  09-10-21 Pos cologuard. PCP referred for colonoscopy in 2 weeks BMD:   n/a  Result  n/a TDaP:  07-31-15 Gardasil:   n/a HIV: donated blood in the past Hep C: donated blood in the past Screening Labs:  PCP and endocrinology   reports that she has never smoked. She has never used smokeless tobacco. She reports that she does not currently use alcohol. She reports that she does not use drugs.  Past Medical History:  Diagnosis Date   Anemia    Chicken pox    Depression    Resolved. in 78s- meds and counseling.   Family history of hemochromatosis    father- screen iron   Gestational diabetes 09/08/1985   a1c q3 years   Hammer toe    Morton's neuroma    per records,pt.denies 10/15/21    Past Surgical History:  Procedure Laterality Date   ENDOMETRIAL ABLATION  2010   heavy menstrual bleeding   TONSILLECTOMY AND  ADENOIDECTOMY  1965    Current Outpatient Medications  Medication Sig Dispense Refill   Accu-Chek FastClix Lancets MISC Use as instructed 204 each 3   acyclovir ointment (ZOVIRAX) 5 % Apply 1 application topically every 3 (three) hours. 30 g 1   BAQSIMI TWO PACK 3 MG/DOSE POWD Use as needed for hypoglycemia 1 each PRN   Blood Glucose Calibration (ACCU-CHEK GUIDE CONTROL) LIQD To calibrate Accu-Chek Guide meter 1 each 11   Cholecalciferol 50 MCG (2000 UT) CAPS Take 1 capsule by mouth daily.     Continuous Blood Gluc Sensor (DEXCOM G6 SENSOR) MISC SMARTSIG:1 Device SUB-Q     Continuous Blood Gluc Transmit (DEXCOM G6 TRANSMITTER) MISC 1 Device by Does not apply route every 3 (three) months. 1 each 3   glucose blood test strip Use as instructed to check blood sugar 4 times a day 150 each 3   Insulin Aspart, w/Niacinamide, (FIASP PENFILL) 100 UNIT/ML SOCT Use up to 20 units a day as advised 15 mL 11   Insulin Glargine (BASAGLAR KWIKPEN) 100 UNIT/ML Inject 6 units in a.m. and 3 units at bedtime under skin, as advised 30 mL 3   Insulin Pen Needle (BD PEN NEEDLE NANO 2ND GEN) 32G X 4 MM MISC USE 6-8 TIMES A DAY FOR INSULIN 500  each 3   NOVOPEN ECHO DEVI once.     vitamin B-12 (CYANOCOBALAMIN) 250 MCG tablet Take 250 mcg by mouth daily.     No current facility-administered medications for this visit.    Family History  Problem Relation Age of Onset   Osteoporosis Mother    Depression Mother        dealing with some memory issues   Glaucoma Mother    Dementia Mother    Hemochromatosis Father    Alcoholism Father    Hearing loss Brother        otosclerosis   Hypertension Brother    Hyperlipidemia Brother    Diabetes Brother        ? PE as well- she will verify   Hyperlipidemia Brother    Stroke Other        grandparent   Hypertension Other        grandparent   Colon cancer Neg Hx    Colon polyps Neg Hx    Esophageal cancer Neg Hx    Rectal cancer Neg Hx    Stomach cancer Neg Hx      Review of Systems  Genitourinary:        Vaginal prolapse has worsened  All other systems reviewed and are negative.  Exam:   BP 122/72    Pulse 60    Resp 16    Ht 5' (1.524 m)    Wt 118 lb (53.5 kg)    LMP 09/09/2007 (Approximate) Comment: this was with her endometrial ablation   BMI 23.05 kg/m     General appearance: alert, cooperative and appears stated age Head: normocephalic, without obvious abnormality, atraumatic Neck: no adenopathy, supple, symmetrical, trachea midline and thyroid normal to inspection and palpation Lungs: clear to auscultation bilaterally Breasts: normal appearance, no masses or tenderness, No nipple retraction or dimpling, No nipple discharge or bleeding, No axillary adenopathy Heart: regular rate and rhythm Abdomen: soft, non-tender; no masses, no organomegaly Extremities: extremities normal, atraumatic, no cyanosis or edema Skin: skin color, texture, turgor normal. No rashes or lesions Lymph nodes: cervical, supraclavicular, and axillary nodes normal. Neurologic: grossly normal  Pelvic: External genitalia:  no lesions              No abnormal inguinal nodes palpated.              Urethra:  normal appearing urethra with no masses, tenderness or lesions              Bartholins and Skenes: normal                 Vagina: normal appearing vagina with normal color and discharge, no lesions.  First degree uterine prolapse and first to second degree rectocele.              Cervix: no lesions              Pap taken: no Bimanual Exam:  Uterus:  normal size, contour, position, consistency, mobility, non-tender.                Adnexa: no mass, fullness, tenderness              Rectal exam: yes.  Confirms.              Anus:  normal sphincter tone, no lesions  Ring with support pessary removed, cleansed and replaced.   Chaperone was present for exam:  Joy, CMA.  Assessment:   Well woman visit with  gynecologic exam. Incomplete uterovaginal prolapse.  Using a  pessary ring with support. Postmenopausal bleeding.  I suspect this is from the pessary itself. Hx endometrial ablation.  DM.  On insulin.  Positive Cologuard.   Plan: Mammogram screening discussed. Self breast awareness reviewed. Pap and HR HPV as above. Guidelines for Calcium, Vitamin D, regular exercise program including cardiovascular and weight bearing exercise. Return for pelvis US and leave pessary out for 2 days prior to appointment.  She has upcoming colonoscopy. Follow up annually and prn.    After visit summary provided.

## 2021-10-15 ENCOUNTER — Ambulatory Visit (AMBULATORY_SURGERY_CENTER): Payer: BC Managed Care – PPO | Admitting: *Deleted

## 2021-10-15 ENCOUNTER — Encounter: Payer: Self-pay | Admitting: Obstetrics and Gynecology

## 2021-10-15 ENCOUNTER — Ambulatory Visit (INDEPENDENT_AMBULATORY_CARE_PROVIDER_SITE_OTHER): Payer: BC Managed Care – PPO | Admitting: Obstetrics and Gynecology

## 2021-10-15 ENCOUNTER — Other Ambulatory Visit: Payer: Self-pay

## 2021-10-15 VITALS — BP 122/72 | HR 60 | Resp 16 | Ht 60.0 in | Wt 118.0 lb

## 2021-10-15 VITALS — Ht 60.0 in | Wt 118.0 lb

## 2021-10-15 DIAGNOSIS — Z01419 Encounter for gynecological examination (general) (routine) without abnormal findings: Secondary | ICD-10-CM

## 2021-10-15 DIAGNOSIS — N950 Postmenopausal bleeding: Secondary | ICD-10-CM | POA: Diagnosis not present

## 2021-10-15 DIAGNOSIS — R195 Other fecal abnormalities: Secondary | ICD-10-CM

## 2021-10-15 NOTE — Progress Notes (Signed)
No egg or soy allergy known to patient  No issues known to pt with past sedation with any surgeries or procedures Patient denies ever being told they had issues or difficulty with intubation  No FH of Malignant Hyperthermia Pt is not on diet pills Pt is not on  home 02  Pt is not on blood thinners  Pt denies issues with constipation  No A fib or A flutter  Pt is fully vaccinated  for Covid   Due to the COVID-19 pandemic we are asking patients to follow certain guidelines in PV and the [place]   Pt aware of COVID protocols and [facility] guidelines   PV completed over the phone. Pt verified name, DOB, address and insurance during PV today.  Pt mailed instruction packet with copy of consent form to read and not return, and instructions.  Pt encouraged to call with questions or issues.  If pt has My chart, procedure instructions sent via My Chart .  Sample sheet of over the counter items to purchase for prep sent.   Pt. Stated she was going to see endocrinologist on 10/21/21 and wanted to talk to dr. About recommendations,encouraged pt. To talk wit dr and do what is best for pt.

## 2021-10-15 NOTE — Patient Instructions (Addendum)
Brittany Barton,   Please leave you pessary out for a couple of days prior to your ultrasound appointment.  I will re-examine you at that visit after the ultrasound has been performed.   Thank you!  Brittany Half, MD    EXERCISE AND DIET:  We recommended that you start or continue a regular exercise program for good health. Regular exercise means any activity that makes your heart beat faster and makes you sweat.  We recommend exercising at least 30 minutes per day at least 3 days a week, preferably 4 or 5.  We also recommend a diet low in fat and sugar.  Inactivity, poor dietary choices and obesity can cause diabetes, heart attack, stroke, and kidney damage, among others.    ALCOHOL AND SMOKING:  Women should limit their alcohol intake to no more than 7 drinks/beers/glasses of wine (combined, not each!) per week. Moderation of alcohol intake to this level decreases your risk of breast cancer and liver damage. And of course, no recreational drugs are part of a healthy lifestyle.  And absolutely no smoking or even second hand smoke. Most people know smoking can cause heart and lung diseases, but did you know it also contributes to weakening of your bones? Aging of your skin?  Yellowing of your teeth and nails?  CALCIUM AND VITAMIN D:  Adequate intake of calcium and Vitamin D are recommended.  The recommendations for exact amounts of these supplements seem to change often, but generally speaking 600 mg of calcium (either carbonate or citrate) and 800 units of Vitamin D per day seems prudent. Certain women may benefit from higher intake of Vitamin D.  If you are among these women, your doctor will have told you during your visit.    PAP SMEARS:  Pap smears, to check for cervical cancer or precancers,  have traditionally been done yearly, although recent scientific advances have shown that most women can have pap smears less often.  However, every woman still should have a physical exam from her gynecologist every  year. It will include a breast check, inspection of the vulva and vagina to check for abnormal growths or skin changes, a visual exam of the cervix, and then an exam to evaluate the size and shape of the uterus and ovaries.  And after 62 years of age, a rectal exam is indicated to check for rectal cancers. We will also provide age appropriate advice regarding health maintenance, like when you should have certain vaccines, screening for sexually transmitted diseases, bone density testing, colonoscopy, mammograms, etc.   MAMMOGRAMS:  All women over 15 years old should have a yearly mammogram. Many facilities now offer a "3D" mammogram, which may cost around $50 extra out of pocket. If possible,  we recommend you accept the option to have the 3D mammogram performed.  It both reduces the number of women who will be called back for extra views which then turn out to be normal, and it is better than the routine mammogram at detecting truly abnormal areas.    COLONOSCOPY:  Colonoscopy to screen for colon cancer is recommended for all women at age 15.  We know, you hate the idea of the prep.  We agree, BUT, having colon cancer and not knowing it is worse!!  Colon cancer so often starts as a polyp that can be seen and removed at colonscopy, which can quite literally save your life!  And if your first colonoscopy is normal and you have no family history of colon cancer,  most women don't have to have it again for 10 years.  Once every ten years, you can do something that may end up saving your life, right?  We will be happy to help you get it scheduled when you are ready.  Be sure to check your insurance coverage so you understand how much it will cost.  It may be covered as a preventative service at no cost, but you should check your particular policy.

## 2021-10-21 ENCOUNTER — Other Ambulatory Visit: Payer: Self-pay

## 2021-10-21 ENCOUNTER — Ambulatory Visit: Payer: BC Managed Care – PPO | Admitting: Internal Medicine

## 2021-10-21 ENCOUNTER — Encounter: Payer: Self-pay | Admitting: Internal Medicine

## 2021-10-21 VITALS — BP 110/72 | HR 66 | Ht 60.0 in | Wt 120.6 lb

## 2021-10-21 DIAGNOSIS — E785 Hyperlipidemia, unspecified: Secondary | ICD-10-CM | POA: Diagnosis not present

## 2021-10-21 DIAGNOSIS — E871 Hypo-osmolality and hyponatremia: Secondary | ICD-10-CM | POA: Diagnosis not present

## 2021-10-21 DIAGNOSIS — E139 Other specified diabetes mellitus without complications: Secondary | ICD-10-CM | POA: Diagnosis not present

## 2021-10-21 LAB — POCT GLYCOSYLATED HEMOGLOBIN (HGB A1C): Hemoglobin A1C: 6.1 % — AB (ref 4.0–5.6)

## 2021-10-21 NOTE — Patient Instructions (Addendum)
Please continue: - Basaglar 5 units in am and 4-5 units and move it at bedtime - FiAsp: insulin to carb ratio 1:22-30 Try to inject FiAsp 10-15 min before coffee.  Please return in 4 months  - 40 min appt.

## 2021-10-21 NOTE — Progress Notes (Signed)
Patient ID: Brittany Barton, female   DOB: [birth year], 61 y.o.   MRN: [id number]   This visit occurred during the SARS-CoV-2 public health emergency.  Safety protocols were in place, including screening questions prior to the visit, additional usage of staff PPE, and extensive cleaning of exam room while observing appropriate contact time as indicated for disinfecting solutions.   HPI: Brittany Barton is a 61 y.o.-year-old female, initially referred by her PCP, Dr. 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  Last visit 4 months ago.  Interim history: No increased urination, blurry vision, nausea, chest pain. She has been having higher blood sugars in am lately. She is preparing for colonoscopy.  A recent FOBT was positive.  Reviewed HbA1c levels: Lab Results  Component Value Date   HGBA1C 5.7 (A) 06/18/2021   HGBA1C 5.8 (A) 02/19/2021   HGBA1C 5.9 (A) 10/16/2020   HGBA1C 5.8 (A) 06/12/2020   HGBA1C 5.7 (A) 02/08/2020   HGBA1C 5.7 (A) 10/20/2019   HGBA1C 5.6 06/20/2019   HGBA1C 7.3 (A) 03/17/2019   HGBA1C 6.6 (H) 11/08/2018   HGBA1C 6.0 08/04/2018   She is on: - Basaglar- added 10/2019: 4-6 ...>> 3-4 >> 7-8 units at bedtime >> 6 units in am and 4 units at bedtime (Levemir is covered) >> 6 units in a.m. and 3 units at bedtime >> 5 units in am and 4 units at night - Humalog >> Lyumjev - insulin to carb ratio 1:16-20 >> 1:22.  If exercising after the meal, 1:30 for that meal. >> FiAsp 1:22-30 Does not usually bolus for coffee.  She checks her sugars more than 4 times a day with her Dexcom CGM:   Previously:   Previously   Lowest sugar was 40 >> .Marland Kitchen. 60s >> 50s >> 60; she has hypoglycemia awareness at 70. Highest sugar was 350 (COVID infection) >> 200s >> 277.  Glucometer: Accuchek   She continues on a plant-based diet without high glycemic index  foods. In 02/2015 she started a plant-based diet and lost more than 80 pounds.  Pt's meals are: - Breakfast: Raw/soaked oatmeal, blueberries or other fruit, ground flaxseed, walnuts (2 tablespoons), soy milk - Lunch: Beans, whole grains, leafy greens, other vegetables and fruit - Dinner: Salad with beans/peas or vegetable-based soup. - Snacks: 0-2-fruit, veggies, homemade bean dip, PB  -No CKD, last BUN/creatinine:  Lab Results  Component Value Date   BUN 8 08/21/2021   BUN 8 02/19/2021   CREATININE 0.54 08/21/2021   CREATININE 0.52 02/19/2021  Not on ACE inhibitor/ARB.  -+ History of HL, diet controlled; last set of lipids: Lab Results  Component Value Date   CHOL 149 08/21/2021   HDL 78.20 08/21/2021   LDLCALC 62 08/21/2021   TRIG 45.0 08/21/2021   CHOLHDL 2 08/21/2021  No statins.  - last eye exam was 02/2021: No DR  - NO numbness and tingling in her feet.  Pt has FH of DM in MGM.  Hyponatremia: She has a history of higher potassium and low sodium: Lab Results  Component Value Date   NA 129 (L) 08/21/2021   NA 133 (L) 02/19/2021   NA 130 (L) 08/16/2020   NA 126 (L) 08/29/2019   NA 126 (L) 08/15/2019   NA 130 (L) 03/17/2019   NA 133 (L) 11/08/2018   NA 129 (L) 08/04/2018   NA 134 (L) 08/06/2017   NA 134 (  L) 07/29/2016   NA 136 07/31/2015   NA 139 05/24/2013   Lab Results  Component Value Date   K 4.8 08/21/2021   K 4.3 02/19/2021   K 4.6 08/16/2020   K 4.0 08/29/2019   K 4.5 08/15/2019   K 4.2 03/17/2019   K 5.7 (H) 11/08/2018   K 3.8 08/04/2018   K 4.0 08/06/2017   K 4.1 07/29/2016   Investigation for hyponatremia reviewed: Component     Latest Ref Rng & Units 08/16/2020  Glucose     65 - 99 mg/dL 60 (L)  BUN     7 - 25 mg/dL 8  Creatinine     0.50 - 0.99 mg/dL 0.50  GFR, Est Non African American     > OR = 60 mL/min/1.62m2 105  Sodium     135 - 146 mmol/L 130 (L)  Potassium     3.5 - 5.3 mmol/L 4.6  Chloride     98 - 110 mmol/L 94 (L)   Osmolality, Urine     50 - 1,200 mOsm/kg 142  Osmolality     278 - 305 mOsm/kg 268 (L)  Sodium, Urine     28 - 272 mmol/L 33   Plasma sodium was 130, slightly low >> hyponatremia Plasma osmolality was 268, lower than 275-280 >> hypoosmolar hyponatremia Urine sodium was 33, not very low >> ruling out excessive fluid losses (chronic diuretics, GI losses, excessive perspiration) or water overload Urine osmolality was 142, higher than 100 >> inappropriately high for hyponatremia  We ruled out adrenal insufficiency by a normal cosyntropin stimulation test: Component     Latest Ref Rng & Units 11/30/2018 11/30/2018 11/30/2018         8:15 AM  8:59 AM  9:26 AM  Cortisol, Plasma     ug/dL 16.8 24.2 28.0  C206 ACTH     6 - 50 pg/mL 15     Also, she does not have a history of hypothyroidism: Lab Results  Component Value Date   TSH 1.07 11/08/2018   TSH 1.36 07/29/2016   TSH 2.41 07/31/2015   She is not on HCTZ, carbamazepine, amitriptyline, SSRIs, opiates. No history of brain injury. No history of generalized infections. No history of malignancy. No recent brain or chest imaging. No chronic kidney disease.  At previous visits, I advised her to try to reduce her fluid intake to 1 L a day.  She was able to limit her intake to 1.5 L a day, but this was difficult for her to do. She had a lot of fluid retention during her COVID-19 episode from 01/2021 and after taking the antiviral medication she eliminated this (approximately 7 to 8 pounds).  Mother also has a history of hyponatremia. Brother with otosclerosis.  ROS: + See HPI  I reviewed pt's medications, allergies, PMH, social hx, family hx, and changes were documented in the history of present illness. Otherwise, unchanged from my initial visit note.  Past Medical History:  Diagnosis Date   Anemia    Chicken pox    Depression    Resolved. in 45s- meds and counseling.   Family history of hemochromatosis    father- screen iron    Gestational diabetes 09/08/1985   a1c q3 years   Hammer toe    Morton's neuroma    per records,pt.denies 10/15/21   Past Surgical History:  Procedure Laterality Date   ENDOMETRIAL ABLATION  2010   heavy menstrual bleeding   TONSILLECTOMY AND ADENOIDECTOMY  1965   Social History   Socioeconomic History   Marital status: Married    Spouse name: Not on file   Number of children: 2   Years of education: Not on file   Highest education level: Not on file  Occupational History    Librarian  Social Needs   Financial resource strain: Not on file   Food insecurity:    Worry: Not on file    Inability: Not on file   Transportation needs:    Medical: Not on file    Non-medical: Not on file  Tobacco Use   Smoking status: Never Smoker   Smokeless tobacco: Never Used  Substance and Sexual Activity   Alcohol use: Yes    Alcohol/week:  2-4 drinks per week    Types:  Wine/beer   Drug use: No   Sexual activity: Yes  Lifestyle   Physical activity: Walk/hike/spin class/weight resistance/stretching    Days per week: 7    Minutes per session: Not on file  Relationships  Social History Narrative   Married. (husband Jeneen Rinks, patient of Dr. Yong Barton). 2 children grown and married. B/c grandmother December 2016.    Has had 81 moves in 64 years, most recently [place] for 1.5 years, [place] 5 years prior, [place] prior      [employer]. In nutrition. Masters in [employer]. In between jobs as Licensed conveyancer.       Current Outpatient Medications on File Prior to Visit  Medication Sig Dispense Refill   Accu-Chek FastClix Lancets MISC Use as instructed 204 each 3   acyclovir ointment (ZOVIRAX) 5 % Apply 1 application topically every 3 (three) hours. 30 g 1   BAQSIMI TWO PACK 3 MG/DOSE POWD Use as needed for hypoglycemia 1 each PRN   Blood Glucose Calibration (ACCU-CHEK GUIDE CONTROL) LIQD To calibrate Accu-Chek Guide meter 1 each 11   Cholecalciferol 50 MCG (2000 UT) CAPS Take 1 capsule by mouth  daily.     Continuous Blood Gluc Sensor (DEXCOM G6 SENSOR) MISC SMARTSIG:1 Device SUB-Q     Continuous Blood Gluc Transmit (DEXCOM G6 TRANSMITTER) MISC 1 Device by Does not apply route every 3 (three) months. 1 each 3   glucose blood test strip Use as instructed to check blood sugar 4 times a day 150 each 3   Insulin Aspart, w/Niacinamide, (FIASP PENFILL) 100 UNIT/ML SOCT Use up to 20 units a day as advised 15 mL 11   Insulin Glargine (BASAGLAR KWIKPEN) 100 UNIT/ML Inject 6 units in a.m. and 3 units at bedtime under skin, as advised 30 mL 3   Insulin Pen Needle (BD PEN NEEDLE NANO 2ND GEN) 32G X 4 MM MISC USE 6-8 TIMES A DAY FOR INSULIN 500 each 3   NOVOPEN ECHO DEVI once.     vitamin B-12 (CYANOCOBALAMIN) 250 MCG tablet Take 250 mcg by mouth daily.     No current facility-administered medications on file prior to visit.   Allergies  Allergen Reactions   Sulfa Antibiotics    Penicillins Rash   Family History  Problem Relation Age of Onset   Osteoporosis Mother    Depression Mother        dealing with some memory issues   Glaucoma Mother    Dementia Mother    Hemochromatosis Father    Alcoholism Father    Hearing loss Brother        otosclerosis   Hypertension Brother    Hyperlipidemia Brother    Diabetes Brother        ?  PE as well- she will verify   Hyperlipidemia Brother    Stroke Other        grandparent   Hypertension Other        grandparent   Colon cancer Neg Hx    Colon polyps Neg Hx    Esophageal cancer Neg Hx    Rectal cancer Neg Hx    Stomach cancer Neg Hx    PE: BP 110/72 (BP Location: Right Arm, Patient Position: Sitting, Cuff Size: Normal)    Pulse 66    Ht 5' (1.524 m)    Wt 120 lb 9.6 oz (54.7 kg)    LMP 09/09/2007 (Approximate) Comment: this was with her endometrial ablation   SpO2 98%    BMI 23.55 kg/m  Wt Readings from Last 3 Encounters:  10/21/21 120 lb 9.6 oz (54.7 kg)  10/15/21 118 lb (53.5 kg)  10/15/21 118 lb (53.5 kg)   Constitutional: normal  weight, in NAD Eyes: PERRLA, EOMI, no exophthalmos ENT: moist mucous membranes, no thyromegaly, no cervical lymphadenopathy Cardiovascular: RRR, No MRG Respiratory: CTA B Musculoskeletal: no deformities, strength intact in all 4 Skin: moist, warm, no rashes Neurological: no tremor with outstretched hands, DTR normal in all 4  ASSESSMENT: 1. LADA, insulin-dependent, controlled, without long-term complications but with occasional hyperglycemia  She had low C-peptide and high GAD antibodies: Component     Latest Ref Rng & Units 03/17/2019  Glucose     65 - 99 mg/dL 192 (H)  C-Peptide     0.80 - 3.85 ng/mL 0.55 (L)   Component     Latest Ref Rng & Units 11/08/2018  Glucose     70 - 99 mg/dL 130 (H)  C-Peptide     0.80 - 3.85 ng/mL 0.55 (L)  Glutamic Acid Decarb Ab     <5 IU/mL >250 (H)  Insulin Antibodies, Human     <0.4 U/mL 3.6 (H)   Component     Latest Ref Rng & Units 11/30/2018  ZNT8 Antibodies     U/mL <15  Islet Cell Ab     Neg:<1:1 Negative   2. HL  3. Hyponatremia  PLAN:  1. Patient with history of LADA, diagnosed in 2020, based on positive GAD antibodies and decrease C-peptide.  She continues on a low glycemic index, plant-based diet, and she is exercising consistently.  She was able to avoid starting insulin for a long time, but we ended up adding mealtime insulin in 2020 and long-acting insulin 2021.  She is very insulin sensitive so we are using low doses of insulin for her.  In 02/2020, we switched from fixed insulin doses to using insulin to carb ratios and a sliding scale.  We were also able to switch from Humalog to Lyumjev, however, at last visit, she was on [place] per insurance preference.  At last visit, we discussed about keeping the snack at night if possible.  At last visit, sugars were a little bit more fluctuating in the previous 2 weeks, as she has been out of town and had higher blood sugars especially after lunch.  We discussed about possibly using a dual  wave bolus for fattier meals.  Right before last visit, she split her Basaglar into 2 doses as she was dropping too much overnight when she was taking entire dose at bedtime.  At that time, I advised her to decrease the dose of nighttime Basaglar since sugars were still too low in the morning. CGM interpretation: -At today's visit, we  reviewed her CGM downloads: It appears that 79.4% of values are in target range (goal >70%), while 17% are higher than 180 (goal <25%), and 3.6% are lower than 70 (goal <4%).  The calculated average blood sugar is 135.  The projected HbA1c for the next 3 months (GMI) is 6.5%. -Reviewing the CGM trends, it appears that her sugars are fairly well controlled overnight but they increase after approximately 5 AM, peaking around 7-8 AM.  They decreased afterwards sometimes to the 60s right after breakfast.  They increase again but not as consistently after lunch and they are usually controlled after dinner.  Upon questioning, she had to increase the dose of Basaglar back to 5 to 6 units but takes it quite early, around dinnertime, between 5 and 6 PM.  We discussed about moving this at that time.  I am hoping that this will help attenuate the blood sugar peak around 7 to 8 AM.  Also, we discussed about trying to take Fiasp 10-15 min before coffee, since now she is taking it at the start of coffee.  For some patients, even though Claiborne Billings is ultra-rapid insulin, it may need to be taken quite early before a meal, not at the start of the meal.  From this point of view, she preferred to Lyumjev, but this is not covered for her anymore.  I do not feel that we absolutely need to increase her Fiasp dose for now especially since she is occasionally dropping her blood sugars after breakfast.  I feel that moving the insulin 15 minutes before coffee will help with this.  We also discussed that if she is exercising after a meal, she may need to take less insulin before that meal. -We also discussed  about how to manage her insulin before her colonoscopy.  I advised her to take the full dose of Basaglar the night before but to decrease the am Basaglar in the morning of the colonoscopy.  Her colonoscopy is early in the day and she could also take Basaglar right after, since I believe that she will be out of the procedure room by 9 AM.  I also advised her to only use Fiasp for correction of high blood sugars in the morning of the colonoscopy.  Afterwards, when she started eating regular meals, she can resume the full doses of Fiasp. - I advised her to: Patient Instructions  Please continue: - Basaglar 5 units in am and 4-5 units and move it at bedtime - FiAsp: insulin to carb ratio 1:22-30 Try to inject FiAsp 10-15 min before coffee.  Please return in 4 months  - 40 min appt.  - we checked her HbA1c: 6.1% (higher, but still excellent) - advised to check sugars at different times of the day - 4x a day, rotating check times - advised for yearly eye exams >> she is UTD - return to clinic in 3-4 months  2. HL -Reviewed latest lipid panel: All fractions at goal: Lab Results  Component Value Date   CHOL 149 08/21/2021   HDL 78.20 08/21/2021   LDLCALC 62 08/21/2021   TRIG 45.0 08/21/2021   CHOLHDL 2 08/21/2021  -She is not on a statin but has excellent lipid levels on the plant-based diet.  3.  Hyponatremia -Asymptomatic: No headaches, mental fog, disequilibrium.  She had significant nausea and fluid retention during her COVID-19 episode last year, but this resolved. 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  If sodium is still low, she may have reset osmostat.  Since sodium was still slightly low, it is is most likely that she has reset osmole stat, which can be just followed. -Latest sodium from 08/21/2021 was lower, at 129.  At that time, BUN was also low, so dehydration is unlikely.   Philemon Kingdom, MD PhD [facility] Endocrinology

## 2021-10-25 ENCOUNTER — Encounter: Payer: Self-pay | Admitting: Gastroenterology

## 2021-10-28 ENCOUNTER — Encounter: Payer: Self-pay | Admitting: Certified Registered Nurse Anesthetist

## 2021-10-29 ENCOUNTER — Ambulatory Visit (AMBULATORY_SURGERY_CENTER): Payer: BC Managed Care – PPO | Admitting: Gastroenterology

## 2021-10-29 ENCOUNTER — Encounter: Payer: Self-pay | Admitting: Gastroenterology

## 2021-10-29 ENCOUNTER — Other Ambulatory Visit: Payer: Self-pay

## 2021-10-29 VITALS — BP 115/53 | HR 49 | Temp 97.3°F | Resp 13 | Ht 60.0 in | Wt 118.0 lb

## 2021-10-29 DIAGNOSIS — K635 Polyp of colon: Secondary | ICD-10-CM

## 2021-10-29 DIAGNOSIS — K5730 Diverticulosis of large intestine without perforation or abscess without bleeding: Secondary | ICD-10-CM | POA: Diagnosis not present

## 2021-10-29 DIAGNOSIS — D122 Benign neoplasm of ascending colon: Secondary | ICD-10-CM

## 2021-10-29 DIAGNOSIS — D128 Benign neoplasm of rectum: Secondary | ICD-10-CM

## 2021-10-29 DIAGNOSIS — R195 Other fecal abnormalities: Secondary | ICD-10-CM

## 2021-10-29 DIAGNOSIS — K621 Rectal polyp: Secondary | ICD-10-CM | POA: Diagnosis not present

## 2021-10-29 DIAGNOSIS — K641 Second degree hemorrhoids: Secondary | ICD-10-CM | POA: Diagnosis not present

## 2021-10-29 MED ORDER — SODIUM CHLORIDE 0.9 % IV SOLN: 500.0000 mL | Freq: Once | INTRAVENOUS | Status: DC

## 2021-10-29 NOTE — Op Note (Signed)
[facility] Patient Name: Brittany Barton Procedure Date: 10/29/2021 8:30 AM MRN: [id number] Endoscopist: Justice Britain , MD Age: 61 Referring MD:  Date of Birth: January 04, 1961 Gender: Female Account #: [id number] Procedure:                Colonoscopy Indications:              Positive Cologuard test Medicines:                Monitored Anesthesia Care Procedure:                Pre-Anesthesia Assessment:                           - Prior to the procedure, a History and Physical                            was performed, and patient medications and                            allergies were reviewed. The patient's tolerance of                            previous anesthesia was also reviewed. The risks                            and benefits of the procedure and the sedation                            options and risks were discussed with the patient.                            All questions were answered, and informed consent                            was obtained. Prior Anticoagulants: The patient has                            taken no previous anticoagulant or antiplatelet                            agents. ASA Grade Assessment: II - A patient with                            mild systemic disease. After reviewing the risks                            and benefits, the patient was deemed in                            satisfactory condition to undergo the procedure.                           After obtaining informed consent, the colonoscope  was passed under direct vision. Throughout the                            procedure, the patient's blood pressure, pulse, and                            oxygen saturations were monitored continuously. The                            Olympus PCF-[id number] (#[id number]) Colonoscope was                            introduced through the anus and advanced to the 4                            cm into the ileum. The colonoscopy was  somewhat                            difficult due to a tortuous colon. Successful                            completion of the procedure was aided by changing                            the patient's position, using manual pressure,                            withdrawing and reinserting the scope,                            straightening and shortening the scope to obtain                            bowel loop reduction and using scope torsion. The                            patient tolerated the procedure. The quality of the                            bowel preparation was good. The terminal ileum,                            ileocecal valve, appendiceal orifice, and rectum                            were photographed. Scope In: 8:39:28 AM Scope Out: 8:58:03 AM Scope Withdrawal Time: 0 hours 13 minutes 58 seconds  Total Procedure Duration: 0 hours 18 minutes 35 seconds  Findings:                 The digital rectal exam findings include                            hemorrhoids. Pertinent negatives include no  palpable rectal lesions.                           The left colon was significantly tortuous.                           The terminal ileum and ileocecal valve appeared                            normal.                           Two sessile polyps were found in the rectum and                            ascending colon. The polyps were 2 to 8 mm in size.                            These polyps were removed with a cold snare.                            Resection and retrieval were complete.                           Multiple small and large-mouthed diverticula were                            found in the entire colon.                           Normal mucosa was found in the entire colon                            otherwise.                           Non-bleeding non-thrombosed external and internal                            hemorrhoids were found during  retroflexion, during                            perianal exam and during digital exam. The                            hemorrhoids were Grade II (internal hemorrhoids                            that prolapse but reduce spontaneously). Complications:            No immediate complications. Estimated Blood Loss:     Estimated blood loss was minimal. Impression:               - Hemorrhoids found on digital rectal exam.                           -  Tortuous left colon.                           - The examined portion of the ileum was normal.                           - Two 2 to 8 mm polyps in the rectum and in the                            ascending colon, removed with a cold snare.                            Resected and retrieved.                           - Diverticulosis in the entire examined colon.                           - Normal mucosa in the entire examined colon                            otherwise.                           - Non-bleeding non-thrombosed external and internal                            hemorrhoids. Recommendation:           - The patient will be observed post-procedure,                            until all discharge criteria are met.                           - Discharge patient to home.                           - Patient has a contact number available for                            emergencies. The signs and symptoms of potential                            delayed complications were discussed with the                            patient. Return to normal activities tomorrow.                            Written discharge instructions were provided to the                            patient.                           -  High fiber diet.                           - Use FiberCon 1-2 tablets PO daily.                           - Continue present medications.                           - Await pathology results.                           - Repeat colonoscopy in 5/7 years  for surveillance                            based on pathology results.                           - The findings and recommendations were discussed                            with the patient.                           - The findings and recommendations were discussed                            with the patient's family. Justice Britain, MD 10/29/2021 9:03:48 AM

## 2021-10-29 NOTE — Progress Notes (Signed)
Report given to PACU, vss 

## 2021-10-29 NOTE — Progress Notes (Signed)
GASTROENTEROLOGY PROCEDURE H&P NOTE   Primary Care Physician: Marin Olp, MD  HPI: Brittany Barton is a 61 y.o. female who presents for Colonoscopy for Positive Cologuard.  Past Medical History:  Diagnosis Date   Anemia    Chicken pox    Depression    Resolved. in 45s- meds and counseling.   Family history of hemochromatosis    father- screen iron   Gestational diabetes 09/08/1985   a1c q3 years   Hammer toe    Morton's neuroma    per records,pt.denies 10/15/21   Past Surgical History:  Procedure Laterality Date   ENDOMETRIAL ABLATION  2010   heavy menstrual bleeding   TONSILLECTOMY AND ADENOIDECTOMY  1965   Current Outpatient Medications  Medication Sig Dispense Refill   Accu-Chek FastClix Lancets MISC Use as instructed 204 each 3   acyclovir ointment (ZOVIRAX) 5 % Apply 1 application topically every 3 (three) hours. 30 g 1   Blood Glucose Calibration (ACCU-CHEK GUIDE CONTROL) LIQD To calibrate Accu-Chek Guide meter 1 each 11   Cholecalciferol 50 MCG (2000 UT) CAPS Take 1 capsule by mouth daily.     Continuous Blood Gluc Sensor (DEXCOM G6 SENSOR) MISC SMARTSIG:1 Device SUB-Q     Continuous Blood Gluc Transmit (DEXCOM G6 TRANSMITTER) MISC 1 Device by Does not apply route every 3 (three) months. 1 each 3   glucose blood test strip Use as instructed to check blood sugar 4 times a day 150 each 3   Insulin Aspart, w/Niacinamide, (FIASP PENFILL) 100 UNIT/ML SOCT Use up to 20 units a day as advised (Patient taking differently: Use up to 25 units a day as advised) 15 mL 11   Insulin Glargine (BASAGLAR KWIKPEN) 100 UNIT/ML Inject 6 units in a.m. and 3 units at bedtime under skin, as advised (Patient taking differently: Inject 5 units in a.m. and 4 units at bedtime under skin, as advised) 30 mL 3   Insulin Pen Needle (BD PEN NEEDLE NANO 2ND GEN) 32G X 4 MM MISC USE 6-8 TIMES A DAY FOR INSULIN 500 each 3   NOVOPEN ECHO DEVI once.     vitamin B-12 (CYANOCOBALAMIN) 250 MCG  tablet Take 250 mcg by mouth daily.     BAQSIMI TWO PACK 3 MG/DOSE POWD Use as needed for hypoglycemia 1 each PRN   Current Facility-Administered Medications  Medication Dose Route Frequency Provider Last Rate Last Admin   0.9 %  sodium chloride infusion  500 mL Intravenous Once Mansouraty, Telford Nab., MD        Current Outpatient Medications:    Accu-Chek FastClix Lancets MISC, Use as instructed, Disp: 204 each, Rfl: 3   acyclovir ointment (ZOVIRAX) 5 %, Apply 1 application topically every 3 (three) hours., Disp: 30 g, Rfl: 1   Blood Glucose Calibration (ACCU-CHEK GUIDE CONTROL) LIQD, To calibrate Accu-Chek Guide meter, Disp: 1 each, Rfl: 11   Cholecalciferol 50 MCG (2000 UT) CAPS, Take 1 capsule by mouth daily., Disp: , Rfl:    Continuous Blood Gluc Sensor (DEXCOM G6 SENSOR) MISC, SMARTSIG:1 Device SUB-Q, Disp: , Rfl:    Continuous Blood Gluc Transmit (DEXCOM G6 TRANSMITTER) MISC, 1 Device by Does not apply route every 3 (three) months., Disp: 1 each, Rfl: 3   glucose blood test strip, Use as instructed to check blood sugar 4 times a day, Disp: 150 each, Rfl: 3   Insulin Aspart, w/Niacinamide, (FIASP PENFILL) 100 UNIT/ML SOCT, Use up to 20 units a day as advised (Patient taking differently: Use  up to 25 units a day as advised), Disp: 15 mL, Rfl: 11   Insulin Glargine (BASAGLAR KWIKPEN) 100 UNIT/ML, Inject 6 units in a.m. and 3 units at bedtime under skin, as advised (Patient taking differently: Inject 5 units in a.m. and 4 units at bedtime under skin, as advised), Disp: 30 mL, Rfl: 3   Insulin Pen Needle (BD PEN NEEDLE NANO 2ND GEN) 32G X 4 MM MISC, USE 6-8 TIMES A DAY FOR INSULIN, Disp: 500 each, Rfl: 3   NOVOPEN ECHO DEVI, once., Disp: , Rfl:    vitamin B-12 (CYANOCOBALAMIN) 250 MCG tablet, Take 250 mcg by mouth daily., Disp: , Rfl:    BAQSIMI TWO PACK 3 MG/DOSE POWD, Use as needed for hypoglycemia, Disp: 1 each, Rfl: PRN  Current Facility-Administered Medications:    0.9 %  sodium  chloride infusion, 500 mL, Intravenous, Once, Mansouraty, Telford Nab., MD Allergies  Allergen Reactions   Sulfa Antibiotics    Penicillins Rash   Family History  Problem Relation Age of Onset   Osteoporosis Mother    Depression Mother        dealing with some memory issues   Glaucoma Mother    Dementia Mother    Hemochromatosis Father    Alcoholism Father    Hearing loss Brother        otosclerosis   Hypertension Brother    Hyperlipidemia Brother    Diabetes Brother        ? PE as well- she will verify   Hyperlipidemia Brother    Stroke Other        grandparent   Hypertension Other        grandparent   Colon cancer Neg Hx    Colon polyps Neg Hx    Esophageal cancer Neg Hx    Rectal cancer Neg Hx    Stomach cancer Neg Hx    Social History   Socioeconomic History   Marital status: Married    Spouse name: Not on file   Number of children: Not on file   Years of education: Not on file   Highest education level: Not on file  Occupational History   Not on file  Tobacco Use   Smoking status: Never   Smokeless tobacco: Never  Vaping Use   Vaping Use: Never used  Substance and Sexual Activity   Alcohol use: Not Currently   Drug use: No   Sexual activity: Yes  Other Topics Concern   Not on file  Social History Narrative   Married. (husband [name] of Dr. Yong Channel). 2 children grown and married.   Son - 2 kids including oldest De Blanch [id number], son is 62 years old   Daughter- 80 year old granddaughter  youngest 2 in 2020 premature 2 months early (64 months old identical twin girls) 08/15/2019         Has had 27 moves in 36 years, most recently [place] for 1.5 years, [place] 5 years prior, [place] prior      B.A. In nutrition. Masters in [employer].  Has worked as a Licensed conveyancer.      Social Determinants of Health   Financial Resource Strain: Not on file  Food Insecurity: Not on file  Transportation Needs: Not on file  Physical Activity: Not on file   Stress: Not on file  Social Connections: Not on file  Intimate Partner Violence: Not on file    Physical Exam: Today's Vitals   10/29/21 0735  BP: 125/81  Pulse: (!) 59  Temp: (!) 97.3 F (36.3 C)  TempSrc: Temporal  SpO2: 100%  Weight: 118 lb (53.5 kg)  Height: 5' (1.524 m)  PainSc: 0-No pain   Body mass index is 23.05 kg/m. GEN: NAD EYE: Sclerae anicteric ENT: MMM CV: Non-tachycardic GI: Soft, NT/ND NEURO:  Alert & Oriented x 3  Lab Results: No results for input(s): WBC, HGB, HCT, PLT in the last 72 hours. BMET No results for input(s): NA, K, CL, CO2, GLUCOSE, BUN, CREATININE, CALCIUM in the last 72 hours. LFT No results for input(s): PROT, ALBUMIN, AST, ALT, ALKPHOS, BILITOT, BILIDIR, IBILI in the last 72 hours. PT/INR No results for input(s): LABPROT, INR in the last 72 hours.   Impression / Plan: This is a 61 y.o.female who presents for Colonoscopy for Positive Cologuard.  The risks and benefits of endoscopic evaluation/treatment were discussed with the patient and/or family; these include but are not limited to the risk of perforation, infection, bleeding, missed lesions, lack of diagnosis, severe illness requiring hospitalization, as well as anesthesia and sedation related illnesses.  The patient's history has been reviewed, patient examined, no change in status, and deemed stable for procedure.  The patient and/or family is agreeable to proceed.    Justice Britain, MD [facility] Office # [phone]

## 2021-10-29 NOTE — Progress Notes (Signed)
Vitals-AG  Pt's states no medical or surgical changes since previsit or office visit.

## 2021-10-29 NOTE — Progress Notes (Signed)
Called to room to assist during endoscopic procedure.  Patient ID and intended procedure confirmed with present staff. Received instructions for my participation in the procedure from the performing physician.  

## 2021-10-29 NOTE — Patient Instructions (Signed)
Handouts given for high fiber diet, polyps and diverticulosis.  Use FiberCon 1-2 tablets per day.  Await pathology results.  Continue present medications.   YOU HAD AN ENDOSCOPIC PROCEDURE TODAY AT [facility]:   Refer to the procedure report that was given to you for any specific questions about what was found during the examination.  If the procedure report does not answer your questions, please call your gastroenterologist to clarify.  If you requested that your care partner not be given the details of your procedure findings, then the procedure report has been included in a sealed envelope for you to review at your convenience later.  YOU SHOULD EXPECT: Some feelings of bloating in the abdomen. Passage of more gas than usual.  Walking can help get rid of the air that was put into your GI tract during the procedure and reduce the bloating. If you had a lower endoscopy (such as a colonoscopy or flexible sigmoidoscopy) you may notice spotting of blood in your stool or on the toilet paper. If you underwent a bowel prep for your procedure, you may not have a normal bowel movement for a few days.  Please Note:  You might notice some irritation and congestion in your nose or some drainage.  This is from the oxygen used during your procedure.  There is no need for concern and it should clear up in a day or so.  SYMPTOMS TO REPORT IMMEDIATELY:  Following lower endoscopy (colonoscopy or flexible sigmoidoscopy):  Excessive amounts of blood in the stool  Significant tenderness or worsening of abdominal pains  Swelling of the abdomen that is new, acute  Fever of 100F or higher  For urgent or emergent issues, a gastroenterologist can be reached at any hour by calling [phone]. Do not use MyChart messaging for urgent concerns.    DIET:  We do recommend a small meal at first, but then you may proceed to your regular diet.  Drink plenty of fluids but you should avoid alcoholic  beverages for 24 hours.  ACTIVITY:  You should plan to take it easy for the rest of today and you should NOT DRIVE or use heavy machinery until tomorrow (because of the sedation medicines used during the test).    FOLLOW UP: Our staff will call the number listed on your records 48-72 hours following your procedure to check on you and address any questions or concerns that you may have regarding the information given to you following your procedure. If we do not reach you, we will leave a message.  We will attempt to reach you two times.  During this call, we will ask if you have developed any symptoms of COVID 19. If you develop any symptoms (ie: fever, flu-like symptoms, shortness of breath, cough etc.) before then, please call [phone].  If you test positive for Covid 19 in the 2 weeks post procedure, please call and report this information to [place].    If any biopsies were taken you will be contacted by phone or by letter within the next 1-3 weeks.  Please call us at [phone] if you have not heard about the biopsies in 3 weeks.    SIGNATURES/CONFIDENTIALITY: You and/or your care partner have signed paperwork which will be entered into your electronic medical record.  These signatures attest to the fact that that the information above on your After Visit Summary has been reviewed and is understood.  Full responsibility of the confidentiality of this discharge information  lies with you and/or your care-partner.

## 2021-10-31 ENCOUNTER — Telehealth: Payer: Self-pay | Admitting: *Deleted

## 2021-10-31 NOTE — Telephone Encounter (Signed)
°  Follow up Call-  Call back number 10/29/2021  Post procedure Call Back phone  # [phone]  Permission to leave phone message Yes  Some recent data might be hidden     Patient questions:  Do you have a fever, pain , or abdominal swelling? No. Pain Score  0 *  Have you tolerated food without any problems? Yes.    Have you been able to return to your normal activities? Yes.    Do you have any questions about your discharge instructions: Diet   No. Medications  No. Follow up visit  No.  Do you have questions or concerns about your Care? No.  Actions: * If pain score is 4 or above: No action needed, pain <4.  Have you developed a fever since your procedure? no  2.   Have you had an respiratory symptoms (SOB or cough) since your procedure? no  3.   Have you tested positive for COVID 19 since your procedure no  4.   Have you had any family members/close contacts diagnosed with the COVID 19 since your procedure?  no   If yes to any of these questions please route to Joylene John, RN and Joella Prince, RN

## 2021-11-05 ENCOUNTER — Encounter: Payer: Self-pay | Admitting: Gastroenterology

## 2021-11-06 ENCOUNTER — Telehealth: Payer: Self-pay | Admitting: Gastroenterology

## 2021-11-06 NOTE — Telephone Encounter (Signed)
The pt has been advised that the path letter was mailed and sent to My Chart.  I have also red her the letter and answered all questions to the best of my ability.  ?

## 2021-11-06 NOTE — Telephone Encounter (Signed)
Inbound call from patient requesting results from colon procedure on 2/21. Please advise.  ?

## 2021-11-19 ENCOUNTER — Ambulatory Visit (INDEPENDENT_AMBULATORY_CARE_PROVIDER_SITE_OTHER): Payer: BC Managed Care – PPO | Admitting: Obstetrics and Gynecology

## 2021-11-19 ENCOUNTER — Other Ambulatory Visit (HOSPITAL_COMMUNITY)
Admission: RE | Admit: 2021-11-19 | Discharge: 2021-11-19 | Disposition: A | Discharge status: Home / Self Care | Payer: BC Managed Care – PPO | Source: Ambulatory Visit | Attending: Obstetrics and Gynecology | Admitting: Obstetrics and Gynecology

## 2021-11-19 ENCOUNTER — Other Ambulatory Visit: Payer: Self-pay

## 2021-11-19 ENCOUNTER — Ambulatory Visit: Payer: BC Managed Care – PPO

## 2021-11-19 ENCOUNTER — Encounter: Payer: Self-pay | Admitting: Obstetrics and Gynecology

## 2021-11-19 VITALS — BP 100/62 | HR 51 | Ht 60.0 in | Wt 118.0 lb

## 2021-11-19 DIAGNOSIS — N9489 Other specified conditions associated with female genital organs and menstrual cycle: Secondary | ICD-10-CM | POA: Insufficient documentation

## 2021-11-19 DIAGNOSIS — N950 Postmenopausal bleeding: Secondary | ICD-10-CM | POA: Diagnosis not present

## 2021-11-19 DIAGNOSIS — N812 Incomplete uterovaginal prolapse: Secondary | ICD-10-CM | POA: Diagnosis not present

## 2021-11-19 NOTE — Progress Notes (Signed)
GYNECOLOGY  VISIT ?  ?HPI: ?61 y.o.   Married  Caucasian  female   ?G2P0 with Patient's last menstrual period was 09/09/2007 (approximate).   ?here for pelvic ultrasound for postmenopausal bleeding.  ? ?She uses a pessary for uterovaginal prolapse.  ?She left the pessary out for 3 days and did ok. ? ?She does not leak urine with pessary in with a cough, laugh, or sneeze.  ?Has some urgency to void and leaks when this occurs.  ? ?Some urgency to have some BMs.  ? ?No further bleeding.  ? ?She has had an endometrial ablation. ? ?Had a colonoscopy due to a positive Cologuard test. ?Did have 2 polyps removed and she will have her next colonoscopy in 5 years.  ? ?GYNECOLOGIC HISTORY: ?Patient's last menstrual period was 09/09/2007 (approximate). ?Contraception:  PMP ?Menopausal hormone therapy:  none ?Last mammogram:   10-08-21 Neg/Birads1 ?Last pap smear:    08-19-18 Neg:Neg HR HPV, 08-05-17 Neg, 07-11-15 Neg ?       ?OB History   ? ? Gravida  ?2  ? Para  ?   ? Term  ?   ? Preterm  ?   ? AB  ?   ? Living  ?2  ?  ? ? SAB  ?   ? IAB  ?   ? Ectopic  ?   ? Multiple  ?   ? Live Births  ?   ?   ?  ?  ?    ? ?Patient Active Problem List  ? Diagnosis Date Noted  ? COVID-19 virus infection 01/25/2021  ? Hyponatremia 10/16/2020  ? LADA (latent autoimmune diabetes of adulthood) ([place]) 11/23/2018  ? Fever blister 08/04/2016  ? Hyperlipidemia 08/04/2016  ? Depression   ? Family history of hemochromatosis   ? ? ?Past Medical History:  ?Diagnosis Date  ? Anemia   ? Chicken pox   ? Depression   ? Resolved. in 63s- meds and counseling.  ? Family history of hemochromatosis   ? father- screen iron  ? Gestational diabetes 09/08/1985  ? a1c q3 years  ? Hammer toe   ? Morton's neuroma   ? per records,pt.denies 10/15/21  ? ? ?Past Surgical History:  ?Procedure Laterality Date  ? ENDOMETRIAL ABLATION  2010  ? heavy menstrual bleeding  ? [place]  ? ? ?Current Outpatient Medications  ?Medication Sig Dispense Refill  ?  Accu-Chek FastClix Lancets MISC Use as instructed 204 each 3  ? acyclovir ointment (ZOVIRAX) 5 % Apply 1 application topically every 3 (three) hours. 30 g 1  ? BAQSIMI TWO PACK 3 MG/DOSE POWD Use as needed for hypoglycemia 1 each PRN  ? Blood Glucose Calibration (ACCU-CHEK GUIDE CONTROL) LIQD To calibrate Accu-Chek Guide meter 1 each 11  ? Cholecalciferol 50 MCG (2000 UT) CAPS Take 1 capsule by mouth daily.    ? Continuous Blood Gluc Sensor (DEXCOM G6 SENSOR) MISC SMARTSIG:1 Device SUB-Q    ? Continuous Blood Gluc Transmit (DEXCOM G6 TRANSMITTER) MISC 1 Device by Does not apply route every 3 (three) months. 1 each 3  ? glucose blood test strip Use as instructed to check blood sugar 4 times a day 150 each 3  ? Insulin Aspart, w/Niacinamide, (FIASP PENFILL) 100 UNIT/ML SOCT Use up to 20 units a day as advised (Patient taking differently: Use up to 25 units a day as advised) 15 mL 11  ? Insulin Glargine (BASAGLAR KWIKPEN) 100 UNIT/ML Inject 6 units in a.m.  and 3 units at bedtime under skin, as advised (Patient taking differently: Inject 5 units in a.m. and 4 units at bedtime under skin, as advised) 30 mL 3  ? Insulin Pen Needle (BD PEN NEEDLE NANO 2ND GEN) 32G X 4 MM MISC USE 6-8 TIMES A DAY FOR INSULIN 500 each 3  ? NOVOPEN ECHO DEVI once.    ? vitamin B-12 (CYANOCOBALAMIN) 250 MCG tablet Take 250 mcg by mouth daily.    ? ?No current facility-administered medications for this visit.  ?  ? ?ALLERGIES: Sulfa antibiotics and Penicillins ? ?Family History  ?Problem Relation Age of Onset  ? Osteoporosis Mother   ? Depression Mother   ?     dealing with some memory issues  ? Glaucoma Mother   ? Dementia Mother   ? Hemochromatosis Father   ? Alcoholism Father   ? Hearing loss Brother   ?     otosclerosis  ? Hypertension Brother   ? Hyperlipidemia Brother   ? Diabetes Brother   ?     ? PE as well- she will verify  ? Hyperlipidemia Brother   ? Stroke Other   ?     grandparent  ? Hypertension Other   ?     grandparent  ? Colon  cancer Neg Hx   ? Colon polyps Neg Hx   ? Esophageal cancer Neg Hx   ? Rectal cancer Neg Hx   ? Stomach cancer Neg Hx   ? ? ?Social History  ? ?Socioeconomic History  ? Marital status: Married  ?  Spouse name: Not on file  ? Number of children: Not on file  ? Years of education: Not on file  ? Highest education level: Not on file  ?Occupational History  ? Not on file  ?Tobacco Use  ? Smoking status: Never  ? Smokeless tobacco: Never  ?Vaping Use  ? Vaping Use: Never used  ?Substance and Sexual Activity  ? Alcohol use: Not Currently  ? Drug use: No  ? Sexual activity: Yes  ?Other Topics Concern  ? Not on file  ?Social History Narrative  ? Married. (husband [name] of Dr. Yong Channel). 2 children grown and married.  ? Son - 2 kids including oldest De Blanch [id number], son is 63 years old  ? Daughter- 68 year old granddaughter  youngest 2 in 2020 premature 2 months early (73 months old identical twin girls) 08/15/2019  ?   ?   ? Has had 12 moves in 32 years, most recently [place] for 1.5 years, [place] 5 years prior, [place] prior  ?   ? B.A. In nutrition. Masters in [employer].  Has worked as a Licensed conveyancer.  ?   ? ?Social Determinants of Health  ? ?Financial Resource Strain: Not on file  ?Food Insecurity: Not on file  ?Transportation Needs: Not on file  ?Physical Activity: Not on file  ?Stress: Not on file  ?Social Connections: Not on file  ?Intimate Partner Violence: Not on file  ? ? ?Review of Systems  ?All other systems reviewed and are negative. ? ?PHYSICAL EXAMINATION:   ? ?BP 100/62   Pulse (!) 51   Ht 5' (1.524 m)   Wt 118 lb (53.5 kg)   LMP 09/09/2007 (Approximate) Comment: this was with her endometrial ablation  SpO2 100%   BMI 23.05 kg/m?     ?General appearance: alert, cooperative and appears stated age ?  ?Pelvic: External genitalia:  no lesions ?  Urethra:  normal appearing urethra with no masses, tenderness or lesions ?             Bartholins and Skenes: normal    ?              Vagina: normal appearing vagina with normal color and discharge, no lesions.  Second degree cystocele, first degree uterine prolapse, first to second degree rectocele.  ?             Cervix: no lesions ?               ?Bimanual Exam:  Uterus:  normal size, contour, position, consistency, mobility, non-tender ?             Adnexa: no mass, fullness, tenderness ?             ?Chaperone was present for exam:  Estill Bamberg, CMA ? ?Pelvic US ?Uterus 6.46 x 4.43 x 3.39 cm.  ?2 fibroids 1.53 cm and 1.98 cm.  ?EMS 13.10 mm. Minimal vascularity.  Echogenic endometrial mass 2.5 x 1.3 cm, possible fibroid. ?Bilateral ovaries atrophic.  ?No adnexal masses.  ?No free fluid.  ? ?EMB ?Consent done.  ?Sterile prep of cervix.  ?Local 10 cc1% lidocaine, lot [id number], exp 01/07/23.  ?Tenaculum to anterior cervical lip. ?Pipelle passed to 6 cm x 2. Firmness felt. ?Small amount of tissue to pathology. ?Silver nitrate and Monsels to cervix.  ? ?ASSESSMENT ? ?Postmenopausal bleeding.  ?Hx endometrial ablation.  ?Uterine fibroids.  ?Endometrial mass.  I suspect this is a fibroid. ?Pelvic organ prolapse.  ?Pessary use. ? ?PLAN ? ?Pelvic ultrasound findings and images reviewed.  ?Follow up endometrial biopsy.  ?We are working toward her surgical plan for treatment of her prolapse.  ?I recommend a follow up visit after her biopsy returns to discuss a surgical plan. ?  ?An After Visit Summary was printed and given to the patient. ? ?20 min  total time was spent for this patient encounter, including preparation, face-to-face counseling with the patient, coordination of care, and documentation of the encounter. ? ? ?

## 2021-11-19 NOTE — Patient Instructions (Signed)
Endometrial Biopsy ?An endometrial biopsy is a procedure to remove tissue samples from the endometrium, which is the lining of the uterus. The tissue that is removed can then be checked under a microscope for disease. ?This procedure is used to diagnose conditions such as endometrial cancer, endometrial tuberculosis, polyps, or other inflammatory conditions. This procedure may also be used to investigate uterine bleeding to determine where you are in your menstrual cycle or how your hormone levels are affecting the lining of the uterus. ?Tell a health care provider about: ?Any allergies you have. ?All medicines you are taking, including vitamins, herbs, eye drops, creams, and over-the-counter medicines. ?Any problems you or family members have had with anesthetic medicines. ?Any blood disorders you have. ?Any surgeries you have had. ?Any medical conditions you have. ?Whether you are pregnant or may be pregnant. ?What are the risks? ?Generally, this is a safe procedure. However, problems may occur, including: ?Bleeding. ?Pelvic infection. ?Puncture of the wall of the uterus with the biopsy device (rare). ?Allergic reactions to medicines. ?What happens before the procedure? ?Keep a record of your menstrual cycles as told by your health care provider. You may need to schedule your procedure for a specific time in your cycle. ?You may want to bring a sanitary pad to wear after the procedure. ?Plan to have someone take you home from the hospital or clinic. ?Ask your health care provider about: ?Changing or stopping your regular medicines. This is especially important if you are taking diabetes medicines, arthritis medicines, or blood thinners. ?Taking medicines such as aspirin and ibuprofen. These medicines can thin your blood. Do not take these medicines unless your health care provider tells you to take them. ?Taking over-the-counter medicines, vitamins, herbs, and supplements. ?What happens during the procedure? ?You  will lie on an exam table with your feet and legs supported as in a pelvic exam. ?Your health care provider will insert an instrument (speculum) into your vagina to see your cervix. ?Your cervix will be cleansed with an antiseptic solution. ?A medicine (local anesthetic) will be used to numb the cervix. ?A forceps instrument (tenaculum) will be used to hold your cervix steady for the biopsy. ?A thin, rod-like instrument (uterine sound) will be inserted through your cervix to determine the length of your uterus and the location where the biopsy sample will be removed. ?A thin, flexible tube (catheter) will be inserted through your cervix and into the uterus. The catheter will be used to collect the biopsy sample from your endometrial tissue. ?The catheter and speculum will then be removed, and the tissue sample will be sent to a lab for examination. ?The procedure may vary among health care providers and hospitals. ?What can I expect after procedure? ?You will rest in a recovery area until you are ready to go home. ?You may have mild cramping and a small amount of vaginal bleeding. This is normal. ?You may have a small amount of vaginal bleeding for a few days. This is normal. ?It is up to you to get the results of your procedure. Ask your health care provider, or the department that is doing the procedure, when your results will be ready. ?Follow these instructions at home: ?Take over-the-counter and prescription medicines only as told by your health care provider. ?Do not douche, use tampons, or have sexual intercourse until your health care provider approves. ?Return to your normal activities as told by your health care provider. Ask your health care provider what activities are safe for you. ?Follow instructions   from your health care provider about any activity restrictions, such as restrictions on strenuous exercise or heavy lifting. ?Keep all follow-up visits. This is important. ?Contact a health care  provider: ?You have heavy bleeding, or bleed for longer than 2 days after the procedure. ?You have bad smelling discharge from your vagina. ?You have a fever or chills. ?You have a burning sensation when urinating or you have difficulty urinating. ?You have severe pain in your lower abdomen. ?Get help right away if you: ?You have severe cramps in your stomach or back. ?You pass large blood clots. ?Your bleeding increases. ?You become weak or light-headed, or you faint or lose consciousness. ?Summary ?An endometrial biopsy is a procedure to remove tissue samples is taken from the endometrium, which is the lining of the uterus. ?The tissue sample that is removed will be checked under a microscope for disease. ?This procedure is used to diagnose conditions such as endometrial cancer, endometrial tuberculosis, polyps, or other inflammatory conditions. ?After the procedure, it is common to have mild cramping and a small amount of vaginal bleeding for a few days. ?Do not douche, use tampons, or have sexual intercourse until your health care provider approves. Ask your health care provider which activities are safe for you. ?This information is not intended to replace advice given to you by your health care provider. Make sure you discuss any questions you have with your health care provider. ?Document Revised: 05/08/2021 Document Reviewed: 03/19/2020 ?Elsevier Patient Education ? 2022 Elsevier Inc. ? ?

## 2021-11-22 LAB — SURGICAL PATHOLOGY

## 2021-11-25 ENCOUNTER — Other Ambulatory Visit: Payer: Self-pay

## 2021-11-25 DIAGNOSIS — R87610 Atypical squamous cells of undetermined significance on cytologic smear of cervix (ASC-US): Secondary | ICD-10-CM

## 2021-12-04 NOTE — Progress Notes (Signed)
?  Subjective:  ?  ? Patient ID: Brittany Barton, female   DOB: [birth year], 61 y.o.   MRN: [id number] ? ?HPI ?Patient here today for colposcopy due to atypia and LSIL on EMB which was done for postmenopausal bleeding. ?No definitive endometrial component noted.  ? ?Patient has uterovaginal prolapse and postmenopausal bleeding. ?She has an echogenic endometrial mass, possible fibroid 2.5 x 1.3 cm.   ? ? ?Review of Systems  ?All other systems reviewed and are negative. ?LMP:  PMP ?Contraception: PMP ? ?   ?Objective:  ? Physical Exam ?Genitourinary: ? ? ? ?Colposcopy - cervix, vagina. ?Consent for procedure.  ?Pap collected with HR HPV testing.  ?3% acetic acid used in vagina and on vulva. ?Colposcopy satisfactory:  Yes   __x___          No    _____ ?Findings:    ?Cervix:  Atrophy noted.  ?Vagina: No lesions.  ?Biopsies:  ECC and 7:00.  ?Monsel's placed.  ?Minimal EBL. ?No complications.  ? ?   ?Assessment:  ?   ?Postmenopausal bleeding.  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  ?Pelvic ultrasound showing echogenic mass, possible fibroid.  ?Hx endometrial ablation.  ?Uterovaginal prolapse.  ?   ?Plan:  ?   ?Follow up pap, HR HPV testing and colposcopic biopsies.  ?   ?

## 2021-12-09 ENCOUNTER — Other Ambulatory Visit (HOSPITAL_COMMUNITY)
Admission: RE | Admit: 2021-12-09 | Discharge: 2021-12-09 | Disposition: A | Discharge status: Home / Self Care | Payer: BC Managed Care – PPO | Source: Ambulatory Visit | Attending: Obstetrics and Gynecology | Admitting: Obstetrics and Gynecology

## 2021-12-09 ENCOUNTER — Encounter: Payer: Self-pay | Admitting: Obstetrics and Gynecology

## 2021-12-09 ENCOUNTER — Ambulatory Visit: Payer: BC Managed Care – PPO | Admitting: Obstetrics and Gynecology

## 2021-12-09 DIAGNOSIS — R87610 Atypical squamous cells of undetermined significance on cytologic smear of cervix (ASC-US): Secondary | ICD-10-CM

## 2021-12-09 NOTE — Patient Instructions (Signed)
Colposcopy, Care After ?The following information offers guidance on how to care for yourself after your procedure. Your health care provider may also give you more specific instructions. If you have problems or questions, contact your health care provider. ?What can I expect after the procedure? ?If you had a colposcopy without a biopsy, you can expect to feel fine right away after your procedure. However, you may have some spotting of blood for a few days. You can return to your normal activities. ?If you had a colposcopy with a biopsy, it is common after the procedure to have: ?Soreness and mild pain. These may last for a few days. ?Mild vaginal bleeding or discharge that is dark-colored and grainy. This may last for a few days. The discharge may be caused by a liquid (solution) that was used during the procedure. You may need to wear a sanitary pad during this time. ?Spotting of blood for at least 48 hours after the procedure. ?Follow these instructions at home: ?Medicines ?Take over-the-counter and prescription medicines only as told by your health care provider. ?Talk with your health care provider about what type of over-the-counter pain medicines and prescription medicines you can start to take again. It is especially important to talk with your health care provider if you take blood thinners. ?Activity ?Avoid using douche products, using tampons, and having sex for at least 3 days after the procedure or for as long as told by your health care provider. ?Return to your normal activities as told by your health care provider. Ask your health care provider what activities are safe for you. ?General instructions ?Ask your health care provider if you may take baths, swim, or use a hot tub. You may take showers. ?If you use birth control (contraception), continue to use it. ?Keep all follow-up visits. This is important. ?Contact a health care provider if: ?You have a fever or chills. ?You faint or feel  light-headed. ?Get help right away if: ?You have heavy bleeding from your vagina or pass blood clots. Heavy bleeding is bleeding that soaks through a sanitary pad in less than 1 hour. ?You have vaginal discharge that is abnormal, is yellow in color, or smells bad. This could be a sign of infection. ?You have severe pain or cramps in your lower abdomen that do not go away with medicine. ?Summary ?If you had a colposcopy without a biopsy, you can expect to feel fine right away, but you may have some spotting of blood for a few days. You can return to your normal activities. ?If you had a colposcopy with a biopsy, it is common to have mild pain for a few days and spotting for 48 hours after the procedure. ?Avoid using douche products, using tampons, and having sex for at least 3 days after the procedure or for as long as told by your health care provider. ?Get help right away if you have heavy bleeding, severe pain, or signs of infection. ?This information is not intended to replace advice given to you by your health care provider. Make sure you discuss any questions you have with your health care provider. ?Document Revised: 01/20/2021 Document Reviewed: 01/20/2021 ?Elsevier Patient Education ? 2022 Elsevier Inc. ? ?

## 2021-12-10 LAB — SURGICAL PATHOLOGY

## 2021-12-11 LAB — CYTOLOGY - PAP
Comment: NEGATIVE
High risk HPV: NEGATIVE

## 2021-12-19 ENCOUNTER — Other Ambulatory Visit: Payer: Self-pay

## 2021-12-19 DIAGNOSIS — N9489 Other specified conditions associated with female genital organs and menstrual cycle: Secondary | ICD-10-CM

## 2021-12-19 DIAGNOSIS — N950 Postmenopausal bleeding: Secondary | ICD-10-CM

## 2022-01-21 ENCOUNTER — Ambulatory Visit (INDEPENDENT_AMBULATORY_CARE_PROVIDER_SITE_OTHER): Payer: BC Managed Care – PPO | Admitting: Obstetrics and Gynecology

## 2022-01-21 ENCOUNTER — Ambulatory Visit: Payer: BC Managed Care – PPO

## 2022-01-21 ENCOUNTER — Other Ambulatory Visit (HOSPITAL_COMMUNITY)
Admission: RE | Admit: 2022-01-21 | Discharge: 2022-01-21 | Disposition: A | Discharge status: Home / Self Care | Payer: BC Managed Care – PPO | Source: Ambulatory Visit | Attending: Obstetrics and Gynecology | Admitting: Obstetrics and Gynecology

## 2022-01-21 ENCOUNTER — Other Ambulatory Visit: Payer: BC Managed Care – PPO | Admitting: Obstetrics and Gynecology

## 2022-01-21 ENCOUNTER — Other Ambulatory Visit: Payer: Self-pay | Admitting: Obstetrics and Gynecology

## 2022-01-21 ENCOUNTER — Other Ambulatory Visit: Payer: BC Managed Care – PPO

## 2022-01-21 DIAGNOSIS — N950 Postmenopausal bleeding: Secondary | ICD-10-CM | POA: Diagnosis not present

## 2022-01-21 DIAGNOSIS — N9489 Other specified conditions associated with female genital organs and menstrual cycle: Secondary | ICD-10-CM | POA: Insufficient documentation

## 2022-01-21 NOTE — Progress Notes (Signed)
Opened in error

## 2022-01-21 NOTE — Progress Notes (Signed)
GYNECOLOGY  VISIT   HPI: 61 y.o.   Married  Caucasian  female   G2P0 with Patient's last menstrual period was 09/09/2007 (approximate).   here for   ultrasound guided endometrial biopsy.   She has uterine prolapse and postmenopausal bleeding.  She had an echogenic mass on pelvic ultrasound 11/19/21 showed an endometrial stripe of 13.10 mm and an echogenic mass 2.5 x 1.3 cm, possible fibroid.   She has had an endometrial ablation, and an endometrial biopsy on 11/19/21 showed mucoid inflammatory debris with microglandular adenosis and atypical squamous epithelium, suspicious for LGSIL.   Colpo biopsy showed LGSIL and benign ECC.    GYNECOLOGIC HISTORY: Patient's last menstrual period was 09/09/2007 (approximate). Contraception:  postmenopausal Menopausal hormone therapy:  NA Last mammogram:   10-08-21 Neg/Birads1 Last pap smear:   12/09/21 - atypia, negative HR HPV.  Colposcopy:  LGSIL and benign ECC.         OB History     Gravida  2   Para      Term      Preterm      AB      Living  2      SAB      IAB      Ectopic      Multiple      Live Births                 Patient Active Problem List   Diagnosis Date Noted   COVID-19 virus infection 01/25/2021   Hyponatremia 10/16/2020   LADA (latent autoimmune diabetes of adulthood) ([place]) 11/23/2018   Fever blister 08/04/2016   Hyperlipidemia 08/04/2016   Depression    Family history of hemochromatosis     Past Medical History:  Diagnosis Date   Anemia    Chicken pox    Depression    Resolved. in 54s- meds and counseling.   Family history of hemochromatosis    father- screen iron   Gestational diabetes 09/08/1985   a1c q3 years   Hammer toe    Morton's neuroma    per records,pt.denies 10/15/21    Past Surgical History:  Procedure Laterality Date   ENDOMETRIAL ABLATION  2010   heavy menstrual bleeding   TONSILLECTOMY AND ADENOIDECTOMY  1965    Current Outpatient Medications  Medication Sig Dispense  Refill   Accu-Chek FastClix Lancets MISC Use as instructed 204 each 3   acyclovir ointment (ZOVIRAX) 5 % Apply 1 application topically every 3 (three) hours. 30 g 1   BAQSIMI TWO PACK 3 MG/DOSE POWD Use as needed for hypoglycemia 1 each PRN   Blood Glucose Calibration (ACCU-CHEK GUIDE CONTROL) LIQD To calibrate Accu-Chek Guide meter 1 each 11   Cholecalciferol 50 MCG (2000 UT) CAPS Take 1 capsule by mouth daily.     Continuous Blood Gluc Sensor (DEXCOM G6 SENSOR) MISC SMARTSIG:1 Device SUB-Q     Continuous Blood Gluc Transmit (DEXCOM G6 TRANSMITTER) MISC 1 Device by Does not apply route every 3 (three) months. 1 each 3   glucose blood test strip Use as instructed to check blood sugar 4 times a day 150 each 3   Insulin Aspart, w/Niacinamide, (FIASP PENFILL) 100 UNIT/ML SOCT Use up to 20 units a day as advised (Patient taking differently: Use up to 25 units a day as advised) 15 mL 11   Insulin Glargine (BASAGLAR KWIKPEN) 100 UNIT/ML Inject 6 units in a.m. and 3 units at bedtime under skin, as advised (Patient taking differently: Inject  5 units in a.m. and 4 units at bedtime under skin, as advised) 30 mL 3   Insulin Pen Needle (BD PEN NEEDLE NANO 2ND GEN) 32G X 4 MM MISC USE 6-8 TIMES A DAY FOR INSULIN 500 each 3   NOVOPEN ECHO DEVI once.     vitamin B-12 (CYANOCOBALAMIN) 250 MCG tablet Take 250 mcg by mouth daily.     No current facility-administered medications for this visit.     ALLERGIES: Sulfa antibiotics and Penicillins  Family History  Problem Relation Age of Onset   Osteoporosis Mother    Depression Mother        dealing with some memory issues   Glaucoma Mother    Dementia Mother    Hemochromatosis Father    Alcoholism Father    Hearing loss Brother        otosclerosis   Hypertension Brother    Hyperlipidemia Brother    Diabetes Brother        ? PE as well- she will verify   Hyperlipidemia Brother    Stroke Other        grandparent   Hypertension Other        grandparent    Colon cancer Neg Hx    Colon polyps Neg Hx    Esophageal cancer Neg Hx    Rectal cancer Neg Hx    Stomach cancer Neg Hx     Social History   Socioeconomic History   Marital status: Married    Spouse name: Not on file   Number of children: Not on file   Years of education: Not on file   Highest education level: Not on file  Occupational History   Not on file  Tobacco Use   Smoking status: Never   Smokeless tobacco: Never  Vaping Use   Vaping Use: Never used  Substance and Sexual Activity   Alcohol use: Not Currently   Drug use: No   Sexual activity: Yes  Other Topics Concern   Not on file  Social History Narrative   Married. (husband [name] of Dr. Yong Channel). 2 children grown and married.   Son - 2 kids including oldest [place] [id number], son is 24 years old   Daughter- 65 year old granddaughter  youngest 2 in 2020 premature 2 months early (6 months old identical twin girls) 08/15/2019         Has had 36 moves in 85 years, most recently [place] 1.5 years, [place] 5 years prior, [place] prior      B.A. In nutrition. Masters in [employer].  Has worked as a Licensed conveyancer.      Social Determinants of Health   Financial Resource Strain: Not on file  Food Insecurity: Not on file  Transportation Needs: Not on file  Physical Activity: Not on file  Stress: Not on file  Social Connections: Not on file  Intimate Partner Violence: Not on file    Review of Systems  See HPI.   PHYSICAL EXAMINATION:    LMP 09/09/2007 (Approximate) Comment: this was with her endometrial ablation    General appearance: alert, cooperative and appears stated age  Pelvic: External genitalia:  no lesions              Urethra:  normal appearing urethra with no masses, tenderness or lesions              Bartholins and Skenes: normal  Vagina: normal appearing vagina with normal color and discharge, no lesions              Cervix: no lesions                Bimanual  Exam:  Uterus:  normal size, contour, position, consistency, mobility, non-tender              Adnexa: no mass, fullness, tenderness            EMB with US guidance.  Sterile prep with Betadine.  Paracervical block with 10 cc 1% lidocaine.  Pipelle passed to 6.5 cm x 2.  Tissue to pathology.  No complications. Minimal EBL.  Chaperone was present for exam:  Raquel Sarna.   ASSESSMENT  Postmenopausal bleeding. Endometrial mass.  Uterine fibroids.  Hx endometrial ablation.  LGSIL of cervix. Uterovaginal prolapse.  First degree uterine prolapse and first to second degree rectocele.  PLAN  FU biopsy results.  Return for discussion of results and plan in 7 - 10 days.    An After Visit Summary was printed and given to the patient.

## 2022-01-21 NOTE — Patient Instructions (Signed)
Endometrial Biopsy  An endometrial biopsy is a procedure to remove tissue samples from the endometrium, which is the lining of the uterus. The tissue that is removed can then be checked under a microscope for disease. This procedure is used to diagnose conditions such as endometrial cancer, endometrial tuberculosis, polyps, or other inflammatory conditions. This procedure may also be used to investigate uterine bleeding to determine where you are in your menstrual cycle or how your hormone levels are affecting the lining of the uterus. Tell a health care provider about: Any allergies you have. All medicines you are taking, including vitamins, herbs, eye drops, creams, and over-the-counter medicines. Any problems you or family members have had with anesthetic medicines. Any blood disorders you have. Any surgeries you have had. Any medical conditions you have. Whether you are pregnant or may be pregnant. What are the risks? Generally, this is a safe procedure. However, problems may occur, including: Bleeding. Pelvic infection. Puncture of the wall of the uterus with the biopsy device (rare). Allergic reactions to medicines. What happens before the procedure? Keep a record of your menstrual cycles as told by your health care provider. You may need to schedule your procedure for a specific time in your cycle. You may want to bring a sanitary pad to wear after the procedure. Plan to have someone take you home from the hospital or clinic. Ask your health care provider about: Changing or stopping your regular medicines. This is especially important if you are taking diabetes medicines, arthritis medicines, or blood thinners. Taking medicines such as aspirin and ibuprofen. These medicines can thin your blood. Do not take these medicines unless your health care provider tells you to take them. Taking over-the-counter medicines, vitamins, herbs, and supplements. What happens during the  procedure? You will lie on an exam table with your feet and legs supported as in a pelvic exam. Your health care provider will insert an instrument (speculum) into your vagina to see your cervix. Your cervix will be cleansed with an antiseptic solution. A medicine (local anesthetic) will be used to numb the cervix. A forceps instrument (tenaculum) will be used to hold your cervix steady for the biopsy. A thin, rod-like instrument (uterine sound) will be inserted through your cervix to determine the length of your uterus and the location where the biopsy sample will be removed. A thin, flexible tube (catheter) will be inserted through your cervix and into the uterus. The catheter will be used to collect the biopsy sample from your endometrial tissue. The catheter and speculum will then be removed, and the tissue sample will be sent to a lab for examination. The procedure may vary among health care providers and hospitals. What can I expect after procedure? You will rest in a recovery area until you are ready to go home. You may have mild cramping and a small amount of vaginal bleeding. This is normal. You may have a small amount of vaginal bleeding for a few days. This is normal. It is up to you to get the results of your procedure. Ask your health care provider, or the department that is doing the procedure, when your results will be ready. Follow these instructions at home: Take over-the-counter and prescription medicines only as told by your health care provider. Do not douche, use tampons, or have sexual intercourse until your health care provider approves. Return to your normal activities as told by your health care provider. Ask your health care provider what activities are safe for you. Follow   instructions from your health care provider about any activity restrictions, such as restrictions on strenuous exercise or heavy lifting. Keep all follow-up visits. This is important. Contact a  health care provider: You have heavy bleeding, or bleed for longer than 2 days after the procedure. You have bad smelling discharge from your vagina. You have a fever or chills. You have a burning sensation when urinating or you have difficulty urinating. You have severe pain in your lower abdomen. Get help right away if you: You have severe cramps in your stomach or back. You pass large blood clots. Your bleeding increases. You become weak or light-headed, or you faint or lose consciousness. Summary An endometrial biopsy is a procedure to remove tissue samples is taken from the endometrium, which is the lining of the uterus. The tissue sample that is removed will be checked under a microscope for disease. This procedure is used to diagnose conditions such as endometrial cancer, endometrial tuberculosis, polyps, or other inflammatory conditions. After the procedure, it is common to have mild cramping and a small amount of vaginal bleeding for a few days. Do not douche, use tampons, or have sexual intercourse until your health care provider approves. Ask your health care provider which activities are safe for you. This information is not intended to replace advice given to you by your health care provider. Make sure you discuss any questions you have with your health care provider. Document Revised: 07/22/2021 Document Reviewed: 03/19/2020 Elsevier Patient Education  2023 Elsevier Inc.  

## 2022-01-23 ENCOUNTER — Encounter: Payer: Self-pay | Admitting: Obstetrics and Gynecology

## 2022-01-23 LAB — SURGICAL PATHOLOGY

## 2022-01-28 NOTE — Progress Notes (Unsigned)
GYNECOLOGY  VISIT   HPI: 61 y.o.   Married  Caucasian  female   G2P0 with Patient's last menstrual period was 09/09/2007 (approximate).   here to discuss EMB results.  Patient's husband is present for the visit today   Has had history of postmenopausal bleeding.  She has had EMB x 2, and insufficient endometrium was obtained.  Her second ultrasound was ultrasound guided.  On pelvic ultrasound she has an endometrial mass 2.5 and 1.3 cm.  [place] also showed 2 fibroids 1.53 and 1.98 cm.  Her ovaries are atrophic.   She has had an endometrial ablation.   She uses a ring with support pessary to treat incomplete uterovaginal prolapse.   Sees endocrinology next week for follow up of her diabetes.  A1C was 6.1 on 10/21/21. She is not on an insulin pump.   GYNECOLOGIC HISTORY: Patient's last menstrual period was 09/09/2007 (approximate). Contraception:  PMP Menopausal hormone therapy:  none Last mammogram:  10-08-21 Neg/Birads1 Last pap smear:  12/09/21 - atypia, negative HR HPV.  Colposcopy:  LGSIL and benign ECC.         OB History     Gravida  2   Para      Term      Preterm      AB      Living  2      SAB      IAB      Ectopic      Multiple      Live Births                 Patient Active Problem List   Diagnosis Date Noted   COVID-19 virus infection 01/25/2021   Hyponatremia 10/16/2020   LADA (latent autoimmune diabetes of adulthood) ([place]) 11/23/2018   Fever blister 08/04/2016   Hyperlipidemia 08/04/2016   Depression    Family history of hemochromatosis     Past Medical History:  Diagnosis Date   Anemia    Chicken pox    Depression    Resolved. in 96s- meds and counseling.   Family history of hemochromatosis    father- screen iron   Gestational diabetes 09/08/1985   a1c q3 years   Hammer toe    Morton's neuroma    per records,pt.denies 10/15/21    Past Surgical History:  Procedure Laterality Date   ENDOMETRIAL ABLATION  2010   heavy menstrual  bleeding   TONSILLECTOMY AND ADENOIDECTOMY  1965    Current Outpatient Medications  Medication Sig Dispense Refill   Accu-Chek FastClix Lancets MISC Use as instructed 204 each 3   acyclovir ointment (ZOVIRAX) 5 % Apply 1 application topically every 3 (three) hours. 30 g 1   BAQSIMI TWO PACK 3 MG/DOSE POWD Use as needed for hypoglycemia 1 each PRN   Blood Glucose Calibration (ACCU-CHEK GUIDE CONTROL) LIQD To calibrate Accu-Chek Guide meter 1 each 11   Cholecalciferol 50 MCG (2000 UT) CAPS Take 1 capsule by mouth daily.     Continuous Blood Gluc Sensor (DEXCOM G6 SENSOR) MISC SMARTSIG:1 Device SUB-Q     Continuous Blood Gluc Transmit (DEXCOM G6 TRANSMITTER) MISC 1 Device by Does not apply route every 3 (three) months. 1 each 3   glucose blood test strip Use as instructed to check blood sugar 4 times a day 150 each 3   Insulin Aspart, w/Niacinamide, (FIASP PENFILL) 100 UNIT/ML SOCT Use up to 20 units a day as advised (Patient taking differently: Use up to 25 units a  day as advised) 15 mL 11   Insulin Glargine (BASAGLAR KWIKPEN) 100 UNIT/ML Inject 6 units in a.m. and 3 units at bedtime under skin, as advised (Patient taking differently: Inject 5 units in a.m. and 4 units at bedtime under skin, as advised) 30 mL 3   Insulin Pen Needle (BD PEN NEEDLE NANO 2ND GEN) 32G X 4 MM MISC USE 6-8 TIMES A DAY FOR INSULIN 500 each 3   NOVOPEN ECHO DEVI once.     triamcinolone cream (KENALOG) 0.1 % Apply  as directed to affected area twice a day     vitamin B-12 (CYANOCOBALAMIN) 250 MCG tablet Take 250 mcg by mouth daily.     No current facility-administered medications for this visit.     ALLERGIES: Sulfa antibiotics and Penicillins  Family History  Problem Relation Age of Onset   Osteoporosis Mother    Depression Mother        dealing with some memory issues   Glaucoma Mother    Dementia Mother    Hemochromatosis Father    Alcoholism Father    Hearing loss Brother        otosclerosis    Hypertension Brother    Hyperlipidemia Brother    Diabetes Brother        ? PE as well- she will verify   Hyperlipidemia Brother    Stroke Other        grandparent   Hypertension Other        grandparent   Colon cancer Neg Hx    Colon polyps Neg Hx    Esophageal cancer Neg Hx    Rectal cancer Neg Hx    Stomach cancer Neg Hx     Social History   Socioeconomic History   Marital status: Married    Spouse name: Not on file   Number of children: Not on file   Years of education: Not on file   Highest education level: Not on file  Occupational History   Not on file  Tobacco Use   Smoking status: Never   Smokeless tobacco: Never  Vaping Use   Vaping Use: Never used  Substance and Sexual Activity   Alcohol use: Not Currently   Drug use: No   Sexual activity: Yes  Other Topics Concern   Not on file  Social History Narrative   Married. (husband [name] of Dr. Yong Channel). 2 children grown and married.   Son - 2 kids including oldest [place] [id number], son is 7 years old   Daughter- 10 year old granddaughter  youngest 2 in 2020 premature 2 months early (25 months old identical twin girls) 08/15/2019         Has had 7 moves in 21 years, most recently [place] for 1.5 years, [place] 5 years prior, [place] prior      B.A. In nutrition. Masters in [employer].  Has worked as a Licensed conveyancer.      Social Determinants of Health   Financial Resource Strain: Not on file  Food Insecurity: Not on file  Transportation Needs: Not on file  Physical Activity: Not on file  Stress: Not on file  Social Connections: Not on file  Intimate Partner Violence: Not on file    Review of Systems  All other systems reviewed and are negative.  PHYSICAL EXAMINATION:    BP 122/74   Ht 5' (1.524 m)   Wt 118 lb (53.5 kg)   LMP 09/09/2007 (Approximate) Comment: this was with her endometrial ablation  BMI  23.05 kg/m     General appearance: alert, cooperative and appears stated  age Lungs: clear to auscultation bilaterally Heart: regular rate and rhythm Lower Extremities: no edema   Pelvic: Deferred.   ASSESSMENT  History of postmenopausal bleeding.  Endometrial mass.  Small uterine fibroids.  Incomplete uterovaginal prolapse.  Pessary use.   PLAN  Pelvic ultrasound findings and endometrial biopsy results reviewed.    Discussion of hysteroscopy with Myosure polypectomy, dilation and curettage.  Risks, benefits, and alternatives reviewed.  Risks include but are not limited to bleeding, infection, damage to surrounding organs including uterine perforation requiring hospitalization and laparoscopy, reaction to anesthesia, DVT, PE, death, need for further treatment. Surgical expectations and recovery discussed.  Patient wishes to proceed.  She would like to do pelvic floor therapy for her prolapse after her dilation and curettage.    An After Visit Summary was printed and given to the patient.  40 min  total time was spent for this patient encounter, including preparation, face-to-face counseling with the patient, coordination of care, and documentation of the encounter.

## 2022-01-29 ENCOUNTER — Ambulatory Visit: Payer: BC Managed Care – PPO | Admitting: Obstetrics and Gynecology

## 2022-01-29 ENCOUNTER — Encounter: Payer: Self-pay | Admitting: Obstetrics and Gynecology

## 2022-01-29 ENCOUNTER — Telehealth: Payer: Self-pay | Admitting: Obstetrics and Gynecology

## 2022-01-29 VITALS — BP 122/74 | Ht 60.0 in | Wt 118.0 lb

## 2022-01-29 DIAGNOSIS — N9489 Other specified conditions associated with female genital organs and menstrual cycle: Secondary | ICD-10-CM

## 2022-01-29 DIAGNOSIS — Z8742 Personal history of other diseases of the female genital tract: Secondary | ICD-10-CM | POA: Diagnosis not present

## 2022-01-29 NOTE — Telephone Encounter (Signed)
Please precert and schedule hysteroscopy with Myosure resection of endometrial mass, dilation and curettage at [facility]   Diagnoses:  postmenopausal bleeding, endometrial mass.   Time needed:  1 hour 30 minutes.   No preop needed.

## 2022-01-30 NOTE — Telephone Encounter (Signed)
Spoke with patient. Reviewed surgery dates. Patient request to proceed with surgery on 03/03/22, patient declines earlier date.  Advised patient I will forward to business office for return call. I will return call once surgery date and time confirmed. Patient verbalizes understanding and is agreeable.   Surgery request sent.

## 2022-01-31 NOTE — Telephone Encounter (Signed)
Left message to call Annslee Tercero, RN at [facility], [phone].  

## 2022-02-06 NOTE — Telephone Encounter (Signed)
Spoke with patient. Surgery date request confirmed.  Advised surgery is scheduled for 03/03/22, [facility], 11am.  Surgery instruction sheet and hospital brochure reviewed, printed copy will be mailed.  Patient verbalizes understanding and is agreeable.  Routing to [employer] for benefits.

## 2022-02-07 NOTE — Telephone Encounter (Signed)
Call to patient. Per DPR, OK to leave message on voicemail. I corresponded the benefits with [place] office for payment    Left voicemail requesting a return call to review benefits for Scheduled Surgery with [name], MD, Cherlynn June.

## 2022-02-10 ENCOUNTER — Ambulatory Visit (INDEPENDENT_AMBULATORY_CARE_PROVIDER_SITE_OTHER): Payer: BC Managed Care – PPO | Admitting: Internal Medicine

## 2022-02-10 ENCOUNTER — Encounter: Payer: Self-pay | Admitting: Internal Medicine

## 2022-02-10 VITALS — BP 118/76 | HR 59 | Ht 60.0 in | Wt 118.6 lb

## 2022-02-10 DIAGNOSIS — E871 Hypo-osmolality and hyponatremia: Secondary | ICD-10-CM | POA: Diagnosis not present

## 2022-02-10 DIAGNOSIS — E785 Hyperlipidemia, unspecified: Secondary | ICD-10-CM | POA: Diagnosis not present

## 2022-02-10 DIAGNOSIS — E139 Other specified diabetes mellitus without complications: Secondary | ICD-10-CM | POA: Diagnosis not present

## 2022-02-10 LAB — POCT GLYCOSYLATED HEMOGLOBIN (HGB A1C): Hemoglobin A1C: 5.6 % (ref 4.0–5.6)

## 2022-02-10 MED ORDER — GLUCOSE BLOOD VI STRP: ORAL_STRIP | 3 refills | Status: DC

## 2022-02-10 NOTE — Progress Notes (Signed)
Patient ID: Brittany Barton, female   DOB: 1961/07/23, 61 y.o.   MRN: [id number]   HPI: Brittany Barton is a 61 y.o.-year-old female, initially referred by her PCP, Dr. 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  Last visit 4 months ago.  Interim history: No increased urination, blurry vision, nausea, chest pain. She is preparing for a procedure  - hysteroscopy after a mass was seen in her uterus.  Biopsy was inconclusive. She also has lichen planus diagnosis last visit  Reviewed HbA1c levels: Lab Results  Component Value Date   HGBA1C 6.1 (A) 10/21/2021   HGBA1C 5.7 (A) 06/18/2021   HGBA1C 5.8 (A) 02/19/2021   HGBA1C 5.9 (A) 10/16/2020   HGBA1C 5.8 (A) 06/12/2020   HGBA1C 5.7 (A) 02/08/2020   HGBA1C 5.7 (A) 10/20/2019   HGBA1C 5.6 06/20/2019   HGBA1C 7.3 (A) 03/17/2019   HGBA1C 6.6 (H) 11/08/2018   She is on: - Basaglar- added 10/2019: 4-6 ...>>  7-8 units at bedtime >> .Marland KitchenMarland Kitchen5 units in am and 4 units at night >> 5 units in am and 3 units at night - Humalog >> Lyumjev - insulin to carb ratio 1:16-20 >> 1:22.  If exercising after the meal, 1:30 for that meal. >> FiAsp 1:22-30 Does not usually bolus for coffee.  She checks her sugars more than 4 times a day with her Dexcom CGM:     Previously:   Previously:   Lowest sugar was 40 >> .Marland Kitchen. 60s >> 50s >> 60 >> 50s; she has hypoglycemia awareness at 70. Highest sugar was 350 (COVID infection) >> 200s >> 277 >> 200s.  Glucometer: Accuchek   She continues on a plant-based diet without high glycemic index foods. In 02/2015 she started a plant-based diet and lost more than 80 pounds.  Pt's meals are: - Breakfast: Raw/soaked oatmeal, blueberries or other fruit, ground flaxseed, walnuts (2 tablespoons), soy milk - Lunch: Beans, whole grains, leafy greens, other vegetables and fruit - Dinner: Salad with  beans/peas or vegetable-based soup. - Snacks: 0-2-fruit, veggies, homemade bean dip, PB  -No CKD, last BUN/creatinine:  Lab Results  Component Value Date   BUN 8 08/21/2021   BUN 8 02/19/2021   CREATININE 0.54 08/21/2021   CREATININE 0.52 02/19/2021  Not on ACE inhibitor/ARB.  -+ History of HL, diet controlled; last set of lipids: Lab Results  Component Value Date   CHOL 149 08/21/2021   HDL 78.20 08/21/2021   LDLCALC 62 08/21/2021   TRIG 45.0 08/21/2021   CHOLHDL 2 08/21/2021  No statins.  - last eye exam was 02/2021: No DR  - NO numbness and tingling in her feet. Last foot exam: 08/2021.  Pt has FH of DM in MGM.  Hyponatremia: She has a history of higher potassium and low sodium: Lab Results  Component Value Date   NA 129 (L) 08/21/2021   NA 133 (L) 02/19/2021   NA 130 (L) 08/16/2020   NA 126 (L) 08/29/2019   NA 126 (L) 08/15/2019   NA 130 (L) 03/17/2019   NA 133 (L) 11/08/2018   NA 129 (L) 08/04/2018   NA 134 (L) 08/06/2017   NA 134 (L) 07/29/2016   NA 136 07/31/2015   NA 139 05/24/2013   Lab Results  Component Value Date   K 4.8 08/21/2021   K 4.3 02/19/2021   K 4.6 08/16/2020   K 4.0 08/29/2019  K 4.5 08/15/2019   K 4.2 03/17/2019   K 5.7 (H) 11/08/2018   K 3.8 08/04/2018   K 4.0 08/06/2017   K 4.1 07/29/2016   Investigation for hyponatremia reviewed: Component     Latest Ref Rng & Units 08/16/2020  Glucose     65 - 99 mg/dL 60 (L)  BUN     7 - 25 mg/dL 8  Creatinine     0.50 - 0.99 mg/dL 0.50  GFR, Est Non African American     > OR = 60 mL/min/1.72m 105  Sodium     135 - 146 mmol/L 130 (L)  Potassium     3.5 - 5.3 mmol/L 4.6  Chloride     98 - 110 mmol/L 94 (L)  Osmolality, Urine     50 - 1,200 mOsm/kg 142  Osmolality     278 - 305 mOsm/kg 268 (L)  Sodium, Urine     28 - 272 mmol/L 33   Plasma sodium was 130, slightly low >> hyponatremia Plasma osmolality was 268, lower than 275-280 >> hypoosmolar hyponatremia Urine sodium was  33, not very low >> ruling out excessive fluid losses (chronic diuretics, GI losses, excessive perspiration) or water overload Urine osmolality was 142, higher than 100 >> inappropriately high for hyponatremia  We ruled out adrenal insufficiency by a normal cosyntropin stimulation test: Component     Latest Ref Rng & Units 11/30/2018 11/30/2018 11/30/2018         8:15 AM  8:59 AM  9:26 AM  Cortisol, Plasma     ug/dL 16.8 24.2 28.0  C206 ACTH     6 - 50 pg/mL 15     Also, she does not have a history of hypothyroidism: Lab Results  Component Value Date   TSH 1.07 11/08/2018   TSH 1.36 07/29/2016   TSH 2.41 07/31/2015   She is not on HCTZ, carbamazepine, amitriptyline, SSRIs, opiates. No history of brain injury. No history of generalized infections. No history of malignancy. No recent brain or chest imaging. No chronic kidney disease.  At previous visits, I advised her to try to reduce her fluid intake to 1 L a day.  She was able to limit her intake to 1.5 L a day, but this was difficult for her to do. She had a lot of fluid retention during her COVID-19 episode from 01/2021 and after taking the antiviral medication she eliminated this (approximately 7 to 8 pounds).  Mother also has a history of hyponatremia. Brother with otosclerosis.  ROS: + See HPI  I reviewed pt's medications, allergies, PMH, social hx, family hx, and changes were documented in the history of present illness. Otherwise, unchanged from my initial visit note.  Past Medical History:  Diagnosis Date   Anemia    Chicken pox    Depression    Resolved. in 423s meds and counseling.   Family history of hemochromatosis    father- screen iron   Gestational diabetes 09/08/1985   a1c q3 years   Hammer toe    Morton's neuroma    per records,pt.denies 10/15/21   Past Surgical History:  Procedure Laterality Date   ENDOMETRIAL ABLATION  2010   heavy menstrual bleeding   TONSILLECTOMY AND ADENOIDECTOMY  1965    Social History   Socioeconomic History   Marital status: Married    Spouse name: Not on file   Number of children: 2   Years of education: Not on file   Highest education level: Not  on file  Occupational History    Librarian  Social Designer, fashion/clothing strain: Not on file   Food insecurity:    Worry: Not on file    Inability: Not on file   Transportation needs:    Medical: Not on file    Non-medical: Not on file  Tobacco Use   Smoking status: Never Smoker   Smokeless tobacco: Never Used  Substance and Sexual Activity   Alcohol use: Yes    Alcohol/week:  2-4 drinks per week    Types:  Wine/beer   Drug use: No   Sexual activity: Yes  Lifestyle   Physical activity: Walk/hike/spin class/weight resistance/stretching    Days per week: 7    Minutes per session: Not on file  Relationships  Social History Narrative   Married. (husband Jeneen Rinks, [name]). 2 children grown and married. B/c grandmother December 2016.    Has had 45 moves in 65 years, most recently [place] for 1.5 years, [place] 5 years prior, [place] prior      [place]. In nutrition. Masters in [employer]. In between jobs as Licensed conveyancer.       Current Outpatient Medications on File Prior to Visit  Medication Sig Dispense Refill   Accu-Chek FastClix Lancets MISC Use as instructed 204 each 3   acyclovir ointment (ZOVIRAX) 5 % Apply 1 application topically every 3 (three) hours. 30 g 1   BAQSIMI TWO PACK 3 MG/DOSE POWD Use as needed for hypoglycemia 1 each PRN   Blood Glucose Calibration (ACCU-CHEK GUIDE CONTROL) LIQD To calibrate Accu-Chek Guide meter 1 each 11   Cholecalciferol 50 MCG (2000 UT) CAPS Take 1 capsule by mouth daily.     Continuous Blood Gluc Sensor (DEXCOM G6 SENSOR) MISC SMARTSIG:1 Device SUB-Q     Continuous Blood Gluc Transmit (DEXCOM G6 TRANSMITTER) MISC 1 Device by Does not apply route every 3 (three) months. 1 each 3   glucose blood test strip Use as instructed to check  blood sugar 4 times a day 150 each 3   Insulin Aspart, w/Niacinamide, (FIASP PENFILL) 100 UNIT/ML SOCT Use up to 20 units a day as advised (Patient taking differently: Use up to 25 units a day as advised) 15 mL 11   Insulin Glargine (BASAGLAR KWIKPEN) 100 UNIT/ML Inject 6 units in a.m. and 3 units at bedtime under skin, as advised (Patient taking differently: Inject 5 units in a.m. and 4 units at bedtime under skin, as advised) 30 mL 3   Insulin Pen Needle (BD PEN NEEDLE NANO 2ND GEN) 32G X 4 MM MISC USE 6-8 TIMES A DAY FOR INSULIN 500 each 3   NOVOPEN ECHO DEVI once.     triamcinolone cream (KENALOG) 0.1 % Apply  as directed to affected area twice a day     vitamin B-12 (CYANOCOBALAMIN) 250 MCG tablet Take 250 mcg by mouth daily.     No current facility-administered medications on file prior to visit.   Allergies  Allergen Reactions   Sulfa Antibiotics    Penicillins Rash   Family History  Problem Relation Age of Onset   Osteoporosis Mother    Depression Mother        dealing with some memory issues   Glaucoma Mother    Dementia Mother    Hemochromatosis Father    Alcoholism Father    Hearing loss Brother        otosclerosis   Hypertension Brother    Hyperlipidemia Brother    Diabetes  Brother        ? PE as well- she will verify   Hyperlipidemia Brother    Stroke Other        grandparent   Hypertension Other        grandparent   Colon cancer Neg Hx    Colon polyps Neg Hx    Esophageal cancer Neg Hx    Rectal cancer Neg Hx    Stomach cancer Neg Hx    PE: BP 118/76 (BP Location: Right Arm, Patient Position: Sitting, Cuff Size: Normal)   Pulse (!) 59   Ht 5' (1.524 m)   Wt 118 lb 9.6 oz (53.8 kg)   LMP 09/09/2007 (Approximate) Comment: this was with her endometrial ablation  SpO2 99%   BMI 23.16 kg/m  Wt Readings from Last 3 Encounters:  02/10/22 118 lb 9.6 oz (53.8 kg)  01/29/22 118 lb (53.5 kg)  12/09/21 118 lb (53.5 kg)   Constitutional: normal weight, in  NAD Eyes: PERRLA, EOMI, no exophthalmos ENT: moist mucous membranes, no thyromegaly, no cervical lymphadenopathy Cardiovascular: RRR, No MRG Respiratory: CTA B Musculoskeletal: no deformities, strength intact in all 4 Skin: moist, warm, no rashes Neurological: no tremor with outstretched hands, DTR normal in all 4  ASSESSMENT: 1. LADA, insulin-dependent, controlled, without long-term complications but with occasional hyperglycemia  She had low C-peptide and high GAD antibodies: Component     Latest Ref Rng & Units 03/17/2019  Glucose     65 - 99 mg/dL 192 (H)  C-Peptide     0.80 - 3.85 ng/mL 0.55 (L)   Component     Latest Ref Rng & Units 11/08/2018  Glucose     70 - 99 mg/dL 130 (H)  C-Peptide     0.80 - 3.85 ng/mL 0.55 (L)  Glutamic Acid Decarb Ab     <5 IU/mL >250 (H)  Insulin Antibodies, Human     <0.4 U/mL 3.6 (H)   Component     Latest Ref Rng & Units 11/30/2018  ZNT8 Antibodies     U/mL <15  Islet Cell Ab     Neg:<1:1 Negative   2. HL  3. Hyponatremia  PLAN:  1. Patient with history of LADA, diagnosed in 2020, based on positive GAD antibodies and decreased C-peptide.  She continues on low glycemic index, plant-based diet, and she is exercising consistently.  Her weight is stable.  She was able to avoid starting insulin for a long time but we ended up adding mealtime insulin in 2020 and long-acting insulin in 2021.  She is very insulin sensitive and we use very low doses of insulin for her.  We are currently using Careers information officer.  We had to split the dose of Basaglar and she is doing better on the twice daily regimen. -At last visit, HbA1c was slightly higher, at 6.1%, and at that time sugars are fairly controlled overnight but they were increasing after approximately 5 AM, peaking around 7-8 AM.  We did increase her Basaglar at night to 4-5 units.  I also advised her to take her insulin before coffee approximately 15 minutes in advance to prevent the rise in blood  sugars after waking up. CGM interpretation: -At today's visit, we reviewed her CGM downloads: It appears that 88% of values are in target range (goal >70%), while 9% are higher than 180 (goal <25%), and 3% are lower than 70 (goal <4%).  The calculated average blood sugar is 121.  The projected HbA1c for the  next 3 months (GMI) is 6.2%. -Reviewing the CGM trends, it appears that her sugars are usually trending down overnight.  She sometimes exercises after dinner and her sugars are lower overnight in those situations.  Upon review of her CGM downloads, she is using the same insulin to carb ratios even if she is walking after dinner.  We discussed about using a lower amount of insulin (advised her to relaxing her ICR from ~1:22 to 1:30 if she exercises in the evening, but since sugars appear to be dropping after lunch and even after breakfast, I advised her to relax her ICR with all meals. -Inquires about intermittent fasting but as of now, I did not recommend this for her, especially since she cannot use an evening fasting window, which is metabolically healthier.  -We also discussed about how to manage her insulin around the time of her hysteroscopy.  When she had her colonoscopy she did not take her morning Basaglar and sugars were 250s at the start of the colonoscopy.  Before the next procedure, I advised her to take a lower dose, 3 units. -We also discussed about the situation when she cannot eat in the morning, since sugars are still increasing after she wakes up, likely due to dawn phenomenon.  I advised her to take 1 unit of insulin even if she does not plan to eat and check her sugars frequently. -At today's visit, she complains of the CGM not be very accurate for her.  It was showing 82 at the time of the visit but she felt hypoglycemic and a glucose in the office was 53.  She ate 1 date that she brought with her.  She is calibrating the CGM whenever she puts on a new one.  We discussed that that  these low glycemic levels, the sensor can give her more errors.  We did discuss about putting the sensor on the arm next time and see if this works better for her.  - I advised her to: Patient Instructions  Please continue: - Basaglar 5 units in am and 2 units at bedtime  Change: - FiAsp: insulin to carb ratio 1:22-30 >> 1:25-30 (always use the 1:30 if going for a walk around dinnertime) Inject FiAsp 10-15 min before coffee.  Please return in 4 months.  - we checked her HbA1c: 5.6% (lower) - advised to check sugars at different times of the day - 4x a day, rotating check times - advised for yearly eye exams >> she is UTD - return to clinic in 3-4 months  2. HL -Reviewed latest lipid panel: All fractions at goal: Lab Results  Component Value Date   CHOL 149 08/21/2021   HDL 78.20 08/21/2021   LDLCALC 62 08/21/2021   TRIG 45.0 08/21/2021   CHOLHDL 2 08/21/2021  -She is not on a statin but has excellent lipid levels on the plant-based diet. -I recommended against intermittent fasting at this visit  3.  Hyponatremia -Asymptomatic: No headaches, mental fog, disequilibrium.  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  If sodium is still low, she may have reset osmostat.  Since sodium was still slightly low, it is is most likely that she has reset  osmostat, which can just be followed -Latest sodium level was from 08/2021: 129  - Total time spent for the visit: 40 minutes, in precharting, obtaining medical information from the patient and from the chart, reviewing her  previous labs, imaging evaluations, and treatments, reviewing her symptoms, counseling her about her conditions (please see the discussed topics above), and developing a plan to further treat them; she had a list of questions which I addressed.  Philemon Kingdom, MD PhD [facility]

## 2022-02-10 NOTE — Patient Instructions (Addendum)
Please continue: - Basaglar 5 units in am and 2 units at bedtime  Change: - FiAsp: insulin to carb ratio 1:22-30 >> 1:25-30 (always use the 1:30 if going for a walk around dinnertime) Inject FiAsp 10-15 min before coffee.  Please return in 4 months.

## 2022-02-13 NOTE — Telephone Encounter (Signed)
Spoke with patient regarding surgery benefits. Patient acknowledges understanding of information presented. Patient is aware that benefits presented are professional benefits only. Patient is aware the hospital will call with facility benefits. See account note.    

## 2022-02-18 ENCOUNTER — Other Ambulatory Visit: Payer: Self-pay | Admitting: Internal Medicine

## 2022-02-18 NOTE — Progress Notes (Signed)
GYNECOLOGY  VISIT   HPI: 61 y.o.   Married  Caucasian  female   G2P0 with Patient's last menstrual period was 09/09/2007 (approximate).   here for pre op exam.    Has had history of postmenopausal bleeding.  She has had EMB x 2, and insufficient endometrium was obtained.  Her second ultrasound was ultrasound guided.  On pelvic ultrasound she has an endometrial mass 2.5 and 1.3 cm.  [place] also showed 2 fibroids 1.53 and 1.98 cm.  Her ovaries are atrophic.   No further bleeding.    She has had an endometrial ablation.    She uses a ring with support pessary to treat incomplete uterovaginal prolapse.   A1C 5.6 on 02/10/22.   States she had a right sided rash on her right breast and right side of her abdomen.  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  She received a steroid cream to use.   GYNECOLOGIC HISTORY: Patient's last menstrual period was 09/09/2007 (approximate). Contraception:  PMP Menopausal hormone therapy:  none Last mammogram:  10-08-21 Neg/Birads1 Last pap smear:    12/09/21 - atypia, negative HR HPV.  Colposcopy:  LGSIL and benign ECC., 08-19-18 Neg:Neg HR HPV, 08-05-17 Neg        OB History     Gravida  2   Para      Term      Preterm      AB      Living  2      SAB      IAB      Ectopic      Multiple      Live Births                 Patient Active Problem List   Diagnosis Date Noted   COVID-19 virus infection 01/25/2021   Hyponatremia 10/16/2020   LADA (latent autoimmune diabetes of adulthood) ([place]) 11/23/2018   Fever blister 08/04/2016   Hyperlipidemia 08/04/2016   Depression    Family history of hemochromatosis     Past Medical History:  Diagnosis Date   Anemia    Chicken pox    Depression    Resolved. in 97s- meds and counseling.   Diabetes mellitus ([place])    Family history of hemochromatosis    father- screen iron   Gestational diabetes 09/08/1985   a1c q3 years   Hammer toe    Morton's neuroma    per  records,pt.denies 10/15/21    Past Surgical History:  Procedure Laterality Date   ENDOMETRIAL ABLATION  2010   heavy menstrual bleeding   TONSILLECTOMY AND ADENOIDECTOMY  1965    Current Outpatient Medications  Medication Sig Dispense Refill   Accu-Chek FastClix Lancets MISC Use as instructed 204 each 3   acyclovir ointment (ZOVIRAX) 5 % Apply 1 application topically every 3 (three) hours. 30 g 1   BAQSIMI TWO PACK 3 MG/DOSE POWD Use as needed for hypoglycemia 1 each PRN   Blood Glucose Calibration (ACCU-CHEK GUIDE CONTROL) LIQD To calibrate Accu-Chek Guide meter 1 each 11   Continuous Blood Gluc Sensor (DEXCOM G6 SENSOR) MISC INJECT 1 DEVICE INTO THE SKIN CONTINUOUS FOR 10 DAYS. 9 each 3   Continuous Blood Gluc Transmit (DEXCOM G6 TRANSMITTER) MISC APPLY EVERY 3 (THREE) MONTHS. 1 each 3   glucose blood test strip Use as instructed to check blood sugar 4 times a day - Accuchek Guide 100 each 3   Insulin Aspart, w/Niacinamide, (FIASP PENFILL) 100  UNIT/ML SOCT Use up to 20 units a day as advised (Patient taking differently: Use up to 25 units a day as advised) 15 mL 11   Insulin Glargine (BASAGLAR KWIKPEN) 100 UNIT/ML Inject 6 units in a.m. and 3 units at bedtime under skin, as advised (Patient taking differently: Inject 5 units in a.m. and 4 units at bedtime under skin, as advised) 30 mL 3   Insulin Pen Needle (BD PEN NEEDLE NANO 2ND GEN) 32G X 4 MM MISC USE 6-8 TIMES A DAY FOR INSULIN 500 each 3   NOVOPEN ECHO DEVI once.     triamcinolone cream (KENALOG) 0.1 % Apply  as directed to affected area twice a day     vitamin B-12 (CYANOCOBALAMIN) 250 MCG tablet Take 250 mcg by mouth daily.     Cholecalciferol 50 MCG (2000 UT) CAPS Take 1 capsule by mouth daily. (Patient not taking: Reported on 02/20/2022)     No current facility-administered medications for this visit.     ALLERGIES: Sulfa antibiotics and Penicillins  Family History  Problem Relation Age of Onset   Osteoporosis Mother     Depression Mother        dealing with some memory issues   Glaucoma Mother    Dementia Mother    Hemochromatosis Father    Alcoholism Father    Hearing loss Brother        otosclerosis   Hypertension Brother    Hyperlipidemia Brother    Diabetes Brother        ? PE as well- she will verify   Hyperlipidemia Brother    Stroke Other        grandparent   Hypertension Other        grandparent   Colon cancer Neg Hx    Colon polyps Neg Hx    Esophageal cancer Neg Hx    Rectal cancer Neg Hx    Stomach cancer Neg Hx     Social History   Socioeconomic History   Marital status: Married    Spouse name: Not on file   Number of children: Not on file   Years of education: Not on file   Highest education level: Not on file  Occupational History   Not on file  Tobacco Use   Smoking status: Never   Smokeless tobacco: Never  Vaping Use   Vaping Use: Never used  Substance and Sexual Activity   Alcohol use: Not Currently   Drug use: No   Sexual activity: Yes  Other Topics Concern   Not on file  Social History Narrative   Married. (husband [name] of Dr. Yong Channel). 2 children grown and married.   Son - 2 kids including oldest De Blanch [id number], son is 81 years old   Daughter- 51 year old granddaughter  youngest 2 in 2020 premature 2 months early (47 months old identical twin girls) 08/15/2019         Has had 76 moves in 6 years, most recently [place] 1.5 years, [place] 5 years prior, [place] prior      B.A. In nutrition. Masters in [employer].  Has worked as a Licensed conveyancer.      Social Determinants of Health   Financial Resource Strain: Not on file  Food Insecurity: Not on file  Transportation Needs: Not on file  Physical Activity: Not on file  Stress: Not on file  Social Connections: Not on file  Intimate Partner Violence: Not on file    Review of Systems  All other systems reviewed and are negative.   PHYSICAL EXAMINATION:    BP 120/62   Ht 5' (1.524  m)   Wt 118 lb (53.5 kg)   LMP 09/09/2007 (Approximate) Comment: this was with her endometrial ablation  BMI 23.05 kg/m     General appearance: alert, cooperative and appears stated age Head: Normocephalic, without obvious abnormality, atraumatic Neck: no adenopathy, supple, symmetrical, trachea midline and thyroid normal to inspection and palpation Lungs: clear to auscultation bilaterally Heart: regular rate and rhythm Abdomen: soft, non-tender, no masses,  no organomegaly Extremities: extremities normal, atraumatic, no cyanosis or edema Skin: Skin color, texture, turgor normal. No rashes or lesions No abnormal inguinal nodes palpated Neurologic: Grossly normal  Pelvic: External genitalia:  no lesions              Urethra:  normal appearing urethra with no masses, tenderness or lesions              Bartholins and Skenes: normal                 Vagina: normal appearing vagina with normal color and discharge, no lesions              Cervix: no lesions                Bimanual Exam:  Uterus:  normal size, contour, position, consistency, mobility, non-tender              Adnexa: no mass, fullness, tenderness             Chaperone was present for exam:  Estill Bamberg, CMA  ASSESSMENT  History of postmenopausal bleeding.  Endometrial mass.  Small uterine fibroids.  Incomplete uterovaginal prolapse.  Pessary use.  History of endometrial ablation.  LGSIL. DM.  PLAN  Will proceed with hysteroscopy, Myosure resection of endometrial mass, dilation and curettage.   Surgical expectation and recovery discussed.  Risks, benefits, and alternatives reviewed with the patient who wishes to proceed.  We discussed her LGSIL diagnosis and follow up plan of pap and HR HPV in April, 2024.    An After Visit Summary was printed and given to the patient.  15 min  total time was spent for this patient encounter, including preparation, face-to-face counseling with the patient, coordination of care, and  documentation of the encounter.

## 2022-02-20 ENCOUNTER — Ambulatory Visit (INDEPENDENT_AMBULATORY_CARE_PROVIDER_SITE_OTHER): Payer: BC Managed Care – PPO | Admitting: Obstetrics and Gynecology

## 2022-02-20 ENCOUNTER — Encounter: Payer: Self-pay | Admitting: Obstetrics and Gynecology

## 2022-02-20 VITALS — BP 120/62 | Ht 60.0 in | Wt 118.0 lb

## 2022-02-20 DIAGNOSIS — N870 Mild cervical dysplasia: Secondary | ICD-10-CM | POA: Diagnosis not present

## 2022-02-20 DIAGNOSIS — N9489 Other specified conditions associated with female genital organs and menstrual cycle: Secondary | ICD-10-CM | POA: Diagnosis not present

## 2022-02-20 DIAGNOSIS — Z8742 Personal history of other diseases of the female genital tract: Secondary | ICD-10-CM

## 2022-02-23 NOTE — H&P (Signed)
Office Visit  02/20/2022 [facility], MD Obstetrics and Gynecology History of postmenopausal bleeding +2 more Dx Pre-op Exam ; Referred by Marin Olp, MD Reason for Visit   Additional Documentation  Vitals:  BP 120/62 Ht 5' (1.524 m) Wt 53.5 kg LMP 09/09/2007 (Approximate)  BMI 23.05 kg/m BSA 1.5 m  Flowsheets:  MEWS Score, Anthropometrics, NEWS, Method of Visit   Encounter Info:  Billing Info, History, Allergies, Detailed Report    All Notes    Progress Notes by Nunzio Cobbs, MD at 02/20/2022 8:00 AM  Author: Nunzio Cobbs, MD Author Type: Physician Filed: 02/20/2022  8:43 AM  Note Status: Signed Cosign: Cosign Not Required Encounter Date: 02/20/2022  Editor: Nunzio Cobbs, MD (Physician)      Prior Versions: 1. Lowella Fairy, CMA (Certified Medical Assistant) at 02/20/2022  8:00 AM - Sign when Signing Visit    GYNECOLOGY  VISIT   HPI: 61 y.o.   Married  Caucasian  female   G2P0 with Patient's last menstrual period was 09/09/2007 (approximate).   here for pre op exam.     Has had history of postmenopausal bleeding.  She has had EMB x 2, and insufficient endometrium was obtained.  Her second ultrasound was ultrasound guided.  On pelvic ultrasound she has an endometrial mass 2.5 and 1.3 cm.  [place] also showed 2 fibroids 1.53 and 1.98 cm.  Her ovaries are atrophic.    No further bleeding.    She has had an endometrial ablation.    She uses a ring with support pessary to treat incomplete uterovaginal prolapse.    A1C 5.6 on 02/10/22.    States she had a right sided rash on her right breast and right side of her abdomen.  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  She received a steroid cream to use.    GYNECOLOGIC HISTORY: Patient's last menstrual period was 09/09/2007 (approximate). Contraception:  PMP Menopausal hormone therapy:  none Last mammogram:   10-08-21 Neg/Birads1 Last pap smear:    12/09/21 - atypia, negative HR HPV.  Colposcopy:  LGSIL and benign ECC., 08-19-18 Neg:Neg HR HPV, 08-05-17 Neg        OB History       Gravida  2   Para      Term      Preterm      AB      Living  2        SAB      IAB      Ectopic      Multiple      Live Births                        Patient Active Problem List    Diagnosis Date Noted   COVID-19 virus infection 01/25/2021   Hyponatremia 10/16/2020   LADA (latent autoimmune diabetes of adulthood) ([place]) 11/23/2018   Fever blister 08/04/2016   Hyperlipidemia 08/04/2016   Depression     Family history of hemochromatosis            Past Medical History:  Diagnosis Date   Anemia     Chicken pox     Depression      Resolved. in 35s- meds and counseling.   Diabetes mellitus ([place])     Family history of hemochromatosis      father-  screen iron   Gestational diabetes 09/08/1985    a1c q3 years   Hammer toe     Morton's neuroma      per records,pt.denies 10/15/21           Past Surgical History:  Procedure Laterality Date   ENDOMETRIAL ABLATION   2010    heavy menstrual bleeding   TONSILLECTOMY AND ADENOIDECTOMY   1965            Current Outpatient Medications  Medication Sig Dispense Refill   Accu-Chek FastClix Lancets MISC Use as instructed 204 each 3   acyclovir ointment (ZOVIRAX) 5 % Apply 1 application topically every 3 (three) hours. 30 g 1   BAQSIMI TWO PACK 3 MG/DOSE POWD Use as needed for hypoglycemia 1 each PRN   Blood Glucose Calibration (ACCU-CHEK GUIDE CONTROL) LIQD To calibrate Accu-Chek Guide meter 1 each 11   Continuous Blood Gluc Sensor (DEXCOM G6 SENSOR) MISC INJECT 1 DEVICE INTO THE SKIN CONTINUOUS FOR 10 DAYS. 9 each 3   Continuous Blood Gluc Transmit (DEXCOM G6 TRANSMITTER) MISC APPLY EVERY 3 (THREE) MONTHS. 1 each 3   glucose blood test strip Use as instructed to check blood sugar 4 times a day - Accuchek Guide 100 each 3   Insulin Aspart,  w/Niacinamide, (FIASP PENFILL) 100 UNIT/ML SOCT Use up to 20 units a day as advised (Patient taking differently: Use up to 25 units a day as advised) 15 mL 11   Insulin Glargine (BASAGLAR KWIKPEN) 100 UNIT/ML Inject 6 units in a.m. and 3 units at bedtime under skin, as advised (Patient taking differently: Inject 5 units in a.m. and 4 units at bedtime under skin, as advised) 30 mL 3   Insulin Pen Needle (BD PEN NEEDLE NANO 2ND GEN) 32G X 4 MM MISC USE 6-8 TIMES A DAY FOR INSULIN 500 each 3   NOVOPEN ECHO DEVI once.       triamcinolone cream (KENALOG) 0.1 % Apply  as directed to affected area twice a day       vitamin B-12 (CYANOCOBALAMIN) 250 MCG tablet Take 250 mcg by mouth daily.       Cholecalciferol 50 MCG (2000 UT) CAPS Take 1 capsule by mouth daily. (Patient not taking: Reported on 02/20/2022)        No current facility-administered medications for this visit.      ALLERGIES: Sulfa antibiotics and Penicillins        Family History  Problem Relation Age of Onset   Osteoporosis Mother     Depression Mother          dealing with some memory issues   Glaucoma Mother     Dementia Mother     Hemochromatosis Father     Alcoholism Father     Hearing loss Brother          otosclerosis   Hypertension Brother     Hyperlipidemia Brother     Diabetes Brother          ? PE as well- she will verify   Hyperlipidemia Brother     Stroke Other          grandparent   Hypertension Other          grandparent   Colon cancer Neg Hx     Colon polyps Neg Hx     Esophageal cancer Neg Hx     Rectal cancer Neg Hx     Stomach cancer Neg Hx  Social History         Socioeconomic History   Marital status: Married      Spouse name: Not on file   Number of children: Not on file   Years of education: Not on file   Highest education level: Not on file  Occupational History   Not on file  Tobacco Use   Smoking status: Never   Smokeless tobacco: Never  Vaping Use   Vaping Use: Never used   Substance and Sexual Activity   Alcohol use: Not Currently   Drug use: No   Sexual activity: Yes  Other Topics Concern   Not on file  Social History Narrative    Married. (husband [name] of Dr. Yong Channel). 2 children grown and married.    Son - 2 kids including oldest De Blanch [id number], son is 28 years old    Daughter- 73 year old granddaughter  youngest 2 in 2020 premature 2 months early (63 months old identical twin girls) 08/15/2019              Has had 16 moves in 53 years, most recently [place] 1[place]5 years, [place] years prior, [place] prior         B.A. In nutrition. Masters in [employer].  Has worked as a Licensed conveyancer.         Social Determinants of Health    Financial Resource Strain: Not on file  Food Insecurity: Not on file  Transportation Needs: Not on file  Physical Activity: Not on file  Stress: Not on file  Social Connections: Not on file  Intimate Partner Violence: Not on file      Review of Systems  All other systems reviewed and are negative.     PHYSICAL EXAMINATION:     BP 120/62   Ht 5' (1.524 m)   Wt 118 lb (53.5 kg)   LMP 09/09/2007 (Approximate) Comment: this was with her endometrial ablation  BMI 23.05 kg/m     General appearance: alert, cooperative and appears stated age Head: Normocephalic, without obvious abnormality, atraumatic Neck: no adenopathy, supple, symmetrical, trachea midline and thyroid normal to inspection and palpation Lungs: clear to auscultation bilaterally Heart: regular rate and rhythm Abdomen: soft, non-tender, no masses,  no organomegaly Extremities: extremities normal, atraumatic, no cyanosis or edema Skin: Skin color, texture, turgor normal. No rashes or lesions No abnormal inguinal nodes palpated Neurologic: Grossly normal   Pelvic: External genitalia:  no lesions              Urethra:  normal appearing urethra with no masses, tenderness or lesions              Bartholins and Skenes: normal                  Vagina: normal appearing vagina with normal color and discharge, no lesions              Cervix: no lesions                Bimanual Exam:  Uterus:  normal size, contour, position, consistency, mobility, non-tender              Adnexa: no mass, fullness, tenderness             Chaperone was present for exam:  Estill Bamberg, CMA   ASSESSMENT   History of postmenopausal bleeding.  Endometrial mass.  Small uterine fibroids.  Incomplete uterovaginal prolapse.  Pessary use.  History of endometrial ablation.  LGSIL. DM.   PLAN   Will proceed with hysteroscopy, Myosure resection of endometrial mass, dilation and curettage.   Surgical expectation and recovery discussed.  Risks, benefits, and alternatives reviewed with the patient who wishes to proceed.  We discussed her LGSIL diagnosis and follow up plan of pap and HR HPV in April, 2024.    An After Visit Summary was printed and given to the patient.   15 min  total time was spent for this patient encounter, including preparation, face-to-face counseling with the patient, coordination of care, and documentation of the encounter.

## 2022-02-26 ENCOUNTER — Encounter (HOSPITAL_BASED_OUTPATIENT_CLINIC_OR_DEPARTMENT_OTHER): Payer: Self-pay | Admitting: Obstetrics and Gynecology

## 2022-02-26 ENCOUNTER — Other Ambulatory Visit: Payer: Self-pay

## 2022-02-26 NOTE — Progress Notes (Signed)
Spoke w/ via phone for pre-op interview---pt  Lab needs dos----    cbc, BMP, EKG           Lab results------none COVID test -----patient states asymptomatic no test needed Arrive at -------0900  NPO after MN NO Solid Food.  Clear liquids from MN until---0800 Med rec completed Medications to take morning of surgery ----- Diabetic medication -----half of her long acting insulin.  Hold insulin the day of surgery Patient instructed no nail polish to be worn day of surgery Patient instructed to bring photo id and insurance card day of surgery Patient aware to have Driver (ride ) / caregiver  husband Brittany Barton   for 24 hours after surgery  Patient Special Instructions -----none Pre-Op special Istructions -----none Patient verbalized understanding of instructions that were given at this phone interview. Patient denies shortness of breath, chest pain, fever, cough at this phone interview.

## 2022-03-03 ENCOUNTER — Ambulatory Visit (HOSPITAL_BASED_OUTPATIENT_CLINIC_OR_DEPARTMENT_OTHER)
Admission: RE | Admit: 2022-03-03 | Discharge: 2022-03-03 | Disposition: A | Discharge status: Home / Self Care | Payer: BC Managed Care – PPO | Source: Ambulatory Visit | Attending: Obstetrics and Gynecology | Admitting: Obstetrics and Gynecology

## 2022-03-03 ENCOUNTER — Other Ambulatory Visit: Payer: Self-pay

## 2022-03-03 ENCOUNTER — Encounter (HOSPITAL_BASED_OUTPATIENT_CLINIC_OR_DEPARTMENT_OTHER): Payer: Self-pay | Admitting: Obstetrics and Gynecology

## 2022-03-03 ENCOUNTER — Ambulatory Visit (HOSPITAL_BASED_OUTPATIENT_CLINIC_OR_DEPARTMENT_OTHER): Payer: BC Managed Care – PPO | Admitting: Anesthesiology

## 2022-03-03 ENCOUNTER — Encounter (HOSPITAL_BASED_OUTPATIENT_CLINIC_OR_DEPARTMENT_OTHER)
Admission: RE | Disposition: A | Discharge status: Home / Self Care | Payer: Self-pay | Source: Ambulatory Visit | Attending: Obstetrics and Gynecology

## 2022-03-03 DIAGNOSIS — E139 Other specified diabetes mellitus without complications: Secondary | ICD-10-CM

## 2022-03-03 DIAGNOSIS — Z8742 Personal history of other diseases of the female genital tract: Secondary | ICD-10-CM

## 2022-03-03 DIAGNOSIS — D759 Disease of blood and blood-forming organs, unspecified: Secondary | ICD-10-CM | POA: Insufficient documentation

## 2022-03-03 DIAGNOSIS — N9489 Other specified conditions associated with female genital organs and menstrual cycle: Secondary | ICD-10-CM

## 2022-03-03 DIAGNOSIS — N950 Postmenopausal bleeding: Secondary | ICD-10-CM | POA: Diagnosis not present

## 2022-03-03 DIAGNOSIS — D649 Anemia, unspecified: Secondary | ICD-10-CM | POA: Insufficient documentation

## 2022-03-03 DIAGNOSIS — D259 Leiomyoma of uterus, unspecified: Secondary | ICD-10-CM | POA: Diagnosis not present

## 2022-03-03 DIAGNOSIS — E109 Type 1 diabetes mellitus without complications: Secondary | ICD-10-CM | POA: Diagnosis not present

## 2022-03-03 HISTORY — DX: COVID-19: U07.1

## 2022-03-03 HISTORY — DX: Lichen planus, unspecified: L43.9

## 2022-03-03 HISTORY — PX: DILATATION & CURETTAGE/HYSTEROSCOPY WITH MYOSURE: SHX6511

## 2022-03-03 HISTORY — DX: Presence of spectacles and contact lenses: Z97.3

## 2022-03-03 LAB — CBC
HCT: 40.6 % (ref 36.0–46.0)
Hemoglobin: 14 g/dL (ref 12.0–15.0)
MCH: 31 pg (ref 26.0–34.0)
MCHC: 34.5 g/dL (ref 30.0–36.0)
MCV: 89.8 fL (ref 80.0–100.0)
Platelets: 152 10*3/uL (ref 150–400)
RBC: 4.52 MIL/uL (ref 3.87–5.11)
RDW: 12.8 % (ref 11.5–15.5)
WBC: 3.3 10*3/uL — ABNORMAL LOW (ref 4.0–10.5)
nRBC: 0 % (ref 0.0–0.2)

## 2022-03-03 LAB — BASIC METABOLIC PANEL
Anion gap: 9 (ref 5–15)
BUN: 8 mg/dL (ref 8–23)
CO2: 24 mmol/L (ref 22–32)
Calcium: 9 mg/dL (ref 8.9–10.3)
Chloride: 98 mmol/L (ref 98–111)
Creatinine, Ser: 0.44 mg/dL (ref 0.44–1.00)
GFR, Estimated: >60 mL/min (ref >60)
Glucose, Bld: 197 mg/dL — ABNORMAL HIGH (ref 70–99)
Potassium: 4.1 mmol/L (ref 3.5–5.1)
Sodium: 131 mmol/L — ABNORMAL LOW (ref 135–145)

## 2022-03-03 LAB — GLUCOSE, CAPILLARY
Glucose-Capillary: 214 mg/dL — ABNORMAL HIGH (ref 70–99)
Glucose-Capillary: 153 mg/dL — ABNORMAL HIGH (ref 70–99)

## 2022-03-03 SURGERY — DILATATION & CURETTAGE/HYSTEROSCOPY WITH MYOSURE
Anesthesia: General

## 2022-03-03 MED ORDER — POVIDONE-IODINE 10 % EX SWAB: 2.0000 | Freq: Once | CUTANEOUS | Status: DC

## 2022-03-03 MED ORDER — ACETAMINOPHEN 500 MG PO TABS
1000.0000 mg | ORAL_TABLET | ORAL | Status: AC
  Administered 2022-03-03: 1000 mg via ORAL

## 2022-03-03 MED ORDER — IBUPROFEN 600 MG PO TABS
600.0000 mg | ORAL_TABLET | Freq: Four times a day (QID) | ORAL | 0 refills | Status: DC | PRN
Start: 2022-03-03 — End: 2022-05-19

## 2022-03-03 MED ORDER — MIDAZOLAM HCL 2 MG/2ML IJ SOLN
INTRAMUSCULAR | Status: AC
  Filled 2022-03-03: qty 2

## 2022-03-03 MED ORDER — SCOPOLAMINE 1 MG/3DAYS TD PT72
MEDICATED_PATCH | TRANSDERMAL | Status: AC
  Filled 2022-03-03: qty 1

## 2022-03-03 MED ORDER — LACTATED RINGERS IV SOLN: INTRAVENOUS | Status: DC

## 2022-03-03 MED ORDER — VASOPRESSIN 20 UNIT/ML IV SOLN
INTRAVENOUS | Status: DC | PRN
  Administered 2022-03-03: 10 [IU]

## 2022-03-03 MED ORDER — FENTANYL CITRATE (PF) 100 MCG/2ML IJ SOLN: 25.0000 ug | INTRAMUSCULAR | Status: DC | PRN

## 2022-03-03 MED ORDER — PROPOFOL 10 MG/ML IV BOLUS
INTRAVENOUS | Status: AC
  Filled 2022-03-03: qty 20

## 2022-03-03 MED ORDER — LIDOCAINE HCL 1 % IJ SOLN
INTRAMUSCULAR | Status: AC
  Filled 2022-03-03: qty 20

## 2022-03-03 MED ORDER — FENTANYL CITRATE (PF) 100 MCG/2ML IJ SOLN
INTRAMUSCULAR | Status: DC | PRN
  Administered 2022-03-03: 50 ug via INTRAVENOUS

## 2022-03-03 MED ORDER — EPHEDRINE SULFATE (PRESSORS) 50 MG/ML IJ SOLN
INTRAMUSCULAR | Status: DC | PRN
  Administered 2022-03-03 (×3): 5 mg via INTRAVENOUS

## 2022-03-03 MED ORDER — INSULIN ASPART 100 UNIT/ML IJ SOLN
1.0000 [IU] | Freq: Once | INTRAMUSCULAR | Status: AC
Start: 2022-03-03 — End: 2022-03-03
  Administered 2022-03-03: 1 [IU] via SUBCUTANEOUS

## 2022-03-03 MED ORDER — LIDOCAINE 2% (20 MG/ML) 5 ML SYRINGE
INTRAMUSCULAR | Status: DC | PRN
  Administered 2022-03-03: 40 mg via INTRAVENOUS

## 2022-03-03 MED ORDER — DEXAMETHASONE SODIUM PHOSPHATE 10 MG/ML IJ SOLN
INTRAMUSCULAR | Status: DC | PRN
  Administered 2022-03-03: 4 mg via INTRAVENOUS

## 2022-03-03 MED ORDER — LIDOCAINE HCL (PF) 2 % IJ SOLN
INTRAMUSCULAR | Status: AC
  Filled 2022-03-03: qty 5

## 2022-03-03 MED ORDER — SODIUM CHLORIDE 0.9 % IR SOLN
Status: DC | PRN
  Administered 2022-03-03: 3000 mL

## 2022-03-03 MED ORDER — ACETAMINOPHEN 500 MG PO TABS
ORAL_TABLET | ORAL | Status: AC
  Filled 2022-03-03: qty 2

## 2022-03-03 MED ORDER — AMISULPRIDE (ANTIEMETIC) 5 MG/2ML IV SOLN: 10.0000 mg | Freq: Once | INTRAVENOUS | Status: DC | PRN

## 2022-03-03 MED ORDER — SCOPOLAMINE 1 MG/3DAYS TD PT72
1.0000 | MEDICATED_PATCH | TRANSDERMAL | Status: DC
  Administered 2022-03-03: 1.5 mg via TRANSDERMAL

## 2022-03-03 MED ORDER — MIDAZOLAM HCL 2 MG/2ML IJ SOLN
INTRAMUSCULAR | Status: DC | PRN
  Administered 2022-03-03: 1 mg via INTRAVENOUS

## 2022-03-03 MED ORDER — PROPOFOL 10 MG/ML IV BOLUS
INTRAVENOUS | Status: DC | PRN
  Administered 2022-03-03: 100 mg via INTRAVENOUS

## 2022-03-03 MED ORDER — LIDOCAINE HCL 1 % IJ SOLN
INTRAMUSCULAR | Status: DC | PRN
  Administered 2022-03-03: 10 mL

## 2022-03-03 MED ORDER — INSULIN ASPART 100 UNIT/ML IJ SOLN
INTRAMUSCULAR | Status: AC
  Filled 2022-03-03: qty 1

## 2022-03-03 MED ORDER — ONDANSETRON HCL 4 MG/2ML IJ SOLN
INTRAMUSCULAR | Status: DC | PRN
  Administered 2022-03-03: 4 mg via INTRAVENOUS

## 2022-03-03 MED ORDER — FENTANYL CITRATE (PF) 100 MCG/2ML IJ SOLN
INTRAMUSCULAR | Status: AC
  Filled 2022-03-03: qty 2

## 2022-03-03 MED ORDER — MEPERIDINE HCL 25 MG/ML IJ SOLN: 6.2500 mg | INTRAMUSCULAR | Status: DC | PRN

## 2022-03-03 SURGICAL SUPPLY — 19 items
CATH ROBINSON RED A/P 16FR (CATHETERS) ×2 IMPLANT
DEVICE MYOSURE LITE (MISCELLANEOUS) IMPLANT
DEVICE MYOSURE REACH (MISCELLANEOUS) ×1 IMPLANT
DILATOR CANAL MILEX (MISCELLANEOUS) IMPLANT
DRSG TELFA 3X8 NADH (GAUZE/BANDAGES/DRESSINGS) ×2 IMPLANT
GAUZE 4X4 16PLY ~~LOC~~+RFID DBL (SPONGE) ×4 IMPLANT
GLOVE BIO SURGEON STRL SZ 6.5 (GLOVE) ×2 IMPLANT
GOWN STRL REUS W/TWL LRG LVL3 (GOWN DISPOSABLE) ×2 IMPLANT
IV NS IRRIG 3000ML ARTHROMATIC (IV SOLUTION) ×2 IMPLANT
KIT PROCEDURE FLUENT (KITS) ×2 IMPLANT
KIT TURNOVER CYSTO (KITS) ×2 IMPLANT
MYOSURE XL FIBROID (MISCELLANEOUS)
PACK VAGINAL MINOR WOMEN LF (CUSTOM PROCEDURE TRAY) ×2 IMPLANT
PAD DRESSING TELFA 3X8 NADH (GAUZE/BANDAGES/DRESSINGS) ×1 IMPLANT
PAD OB MATERNITY 4.3X12.25 (PERSONAL CARE ITEMS) ×2 IMPLANT
SEAL CERVICAL OMNI LOK (ABLATOR) IMPLANT
SEAL ROD LENS SCOPE MYOSURE (ABLATOR) ×2 IMPLANT
SYSTEM TISS REMOVAL MYOSURE XL (MISCELLANEOUS) IMPLANT
TOWEL OR 17X26 10 PK STRL BLUE (TOWEL DISPOSABLE) ×2 IMPLANT

## 2022-03-04 ENCOUNTER — Encounter (HOSPITAL_BASED_OUTPATIENT_CLINIC_OR_DEPARTMENT_OTHER): Payer: Self-pay | Admitting: Obstetrics and Gynecology

## 2022-03-04 LAB — SURGICAL PATHOLOGY

## 2022-03-14 NOTE — Progress Notes (Unsigned)
GYNECOLOGY  VISIT   HPI: 61 y.o.   Married  Caucasian  female   G2P0 with Patient's last menstrual period was 09/09/2007 (approximate).   here for 2 weeks status post  hysterscopic myomectomy dilation and curretage. Finding at surgery showed a fibroid.   Final pathology showed fibrotic fibroid and scanty benign endometrial stroma.   Bled for about one week following surgery.   Had low and high blood sugar following surgery.   Had some urinary retention post surgery.  She did not have pain.  The day after surgery, she voided a lot of urine.   Patient would like to do pelvic floor physical therapy.   She does have a pessary and is waiting to start reusing it again post surgery.  Some urinary urgency.  No fecal incontinence.   GYNECOLOGIC HISTORY: Patient's last menstrual period was 09/09/2007 (approximate). Contraception:  PMP/ablation Menopausal hormone therapy:  none Last mammogram:  10-08-21 Neg/Birads1 Last pap smear:  12-09-21 colpo revealed ECC Neg, cx bx LSIL. 12-09-21 ASCUS:Neg HR HPV, 08-19-18 Neg:Neg HR HPV, 08-05-17 Neg        OB History     Gravida  2   Para      Term      Preterm      AB      Living  2      SAB      IAB      Ectopic      Multiple      Live Births                 Patient Active Problem List   Diagnosis Date Noted   COVID-19 virus infection 01/25/2021   Hyponatremia 10/16/2020   LADA (latent autoimmune diabetes of adulthood) ([place]) 11/23/2018   Fever blister 08/04/2016   Hyperlipidemia 08/04/2016   Depression    Family history of hemochromatosis     Past Medical History:  Diagnosis Date   Anemia    Chicken pox    COVID    may 2022 cold s/s cough . blood sugars out of control.   Depression    Resolved. in 63s- meds and counseling.   Diabetes mellitus ([place])    Family history of hemochromatosis    father- screen iron   Gestational diabetes 09/08/1985   a1c q3 years   Hammer toe    Lichen planus    [facility]es  [facility] - Dr. Particia Nearing   Morton's neuroma    per records,pt.denies 10/15/21   Wears glasses     Past Surgical History:  Procedure Laterality Date   DILATATION & CURETTAGE/HYSTEROSCOPY WITH MYOSURE N/A 03/03/2022   Procedure: hysterscopic myomectomy dilation and curretage;  Surgeon: Nunzio Cobbs, MD;  Location: [facility];  Service: Gynecology;  Laterality: N/A;  hysterscopic myomectomy dilation and curretage   ENDOMETRIAL ABLATION  2010   heavy menstrual bleeding   TONSILLECTOMY AND ADENOIDECTOMY  1965    Current Outpatient Medications  Medication Sig Dispense Refill   Accu-Chek FastClix Lancets MISC Use as instructed 204 each 3   acyclovir ointment (ZOVIRAX) 5 % Apply 1 application topically every 3 (three) hours. (Patient taking differently: Apply 1 application  topically every 3 (three) hours. Not taking now    takes for fever blister) 30 g 1   BAQSIMI TWO PACK 3 MG/DOSE POWD Use as needed for hypoglycemia 1 each PRN   Blood Glucose Calibration (ACCU-CHEK GUIDE CONTROL) LIQD To calibrate Accu-Chek Guide meter 1 each  11   Cholecalciferol 50 MCG (2000 UT) CAPS Take 1 capsule by mouth daily.     Continuous Blood Gluc Sensor (DEXCOM G6 SENSOR) MISC INJECT 1 DEVICE INTO THE SKIN CONTINUOUS FOR 10 DAYS. 9 each 3   Continuous Blood Gluc Transmit (DEXCOM G6 TRANSMITTER) MISC APPLY EVERY 3 (THREE) MONTHS. 1 each 3   glucose blood test strip Use as instructed to check blood sugar 4 times a day - Accuchek Guide 100 each 3   ibuprofen (ADVIL) 600 MG tablet Take 1 tablet (600 mg total) by mouth every 6 (six) hours as needed. 30 tablet 0   Insulin Aspart, w/Niacinamide, (FIASP PENFILL) 100 UNIT/ML SOCT Use up to 20 units a day as advised 15 mL 11   Insulin Glargine (BASAGLAR KWIKPEN) 100 UNIT/ML Inject 6 units in a.m. and 3 units at bedtime under skin, as advised (Patient taking differently: Inject 5 units in a.m. and 4 units at bedtime under skin, as advised) 30  mL 3   Insulin Pen Needle (BD PEN NEEDLE NANO 2ND GEN) 32G X 4 MM MISC USE 6-8 TIMES A DAY FOR INSULIN 500 each 3   NOVOPEN ECHO DEVI once.     tetrahydrozoline-zinc (VISINE-AC) 0.05-0.25 % ophthalmic solution Place 1 drop into both eyes 3 (three) times daily as needed.     triamcinolone cream (KENALOG) 0.1 % For autoimmune rash, not taking right now     vitamin B-12 (CYANOCOBALAMIN) 250 MCG tablet Take 250 mcg by mouth daily. Takes it once a week     No current facility-administered medications for this visit.     ALLERGIES: Penicillins and Sulfa antibiotics  Family History  Problem Relation Age of Onset   Osteoporosis Mother    Depression Mother        dealing with some memory issues   Glaucoma Mother    Dementia Mother    Hemochromatosis Father    Alcoholism Father    Hearing loss Brother        otosclerosis   Hypertension Brother    Hyperlipidemia Brother    Diabetes Brother        ? PE as well- she will verify   Hyperlipidemia Brother    Stroke Other        grandparent   Hypertension Other        grandparent   Colon cancer Neg Hx    Colon polyps Neg Hx    Esophageal cancer Neg Hx    Rectal cancer Neg Hx    Stomach cancer Neg Hx     Social History   Socioeconomic History   Marital status: Married    Spouse name: Not on file   Number of children: Not on file   Years of education: Not on file   Highest education level: Not on file  Occupational History   Not on file  Tobacco Use   Smoking status: Never   Smokeless tobacco: Never  Vaping Use   Vaping Use: Never used  Substance and Sexual Activity   Alcohol use: Not Currently   Drug use: No   Sexual activity: Yes  Other Topics Concern   Not on file  Social History Narrative   Married. (husband [name] of Dr. Yong Channel). 2 children grown and married.   Son - 2 kids including oldest De [place] [address], son is 100 years old   Daughter- 16 year old granddaughter  youngest 2 in 2020 premature 2 months early (36  months old identical twin girls) 08/15/2019  Has had 12 moves in 32 years, most recently [place] 1.5 years, [place] 5 years prior, [place] prior      B.A. In nutrition. Masters in [employer].  Has worked as a Licensed conveyancer.      Social Determinants of Health   Financial Resource Strain: Not on file  Food Insecurity: Not on file  Transportation Needs: Not on file  Physical Activity: Not on file  Stress: Not on file  Social Connections: Not on file  Intimate Partner Violence: Not on file    Review of Systems  All other systems reviewed and are negative.   PHYSICAL EXAMINATION:    BP (!) 118/58   Ht 5' (1.524 m)   Wt 118 lb (53.5 kg)   LMP 09/09/2007 (Approximate) Comment: this was with her endometrial ablation  BMI 23.05 kg/m     General appearance: alert, cooperative and appears stated age   Pelvic: External genitalia:  no lesions              Urethra:  normal appearing urethra with no masses, tenderness or lesions              Bartholins and Skenes: normal                 Vagina: normal appearing vagina with normal color and discharge, no lesions              Cervix: no lesions                Bimanual Exam:  Uterus:  normal size, contour, position, consistency, mobility, non-tender.  First degree uterine prolapse and first degree rectocele.               Adnexa: no mass, fullness, tenderness          Chaperone was present for exam: Estill Bamberg, CMA  ASSESSMENT  Status post hysteroscopy with Myomectomy, dilation and curettage.  Incomplete uterovaginal prolapse.  Urinary urgency.  PLAN  Surgical findings, procedure and pathology report reviewed.  Referral to [facility] for pelvic floor therapy.  Patient will let me know if she wishes to have her pessary resized.  We talked about scenarios that may indicate the size could be changed.  Fu for annual exam in April, 2024.    An After Visit Summary was printed and given to the patient.

## 2022-03-17 ENCOUNTER — Encounter: Payer: Self-pay | Admitting: Obstetrics and Gynecology

## 2022-03-17 ENCOUNTER — Ambulatory Visit (INDEPENDENT_AMBULATORY_CARE_PROVIDER_SITE_OTHER): Payer: BC Managed Care – PPO | Admitting: Obstetrics and Gynecology

## 2022-03-17 ENCOUNTER — Telehealth: Payer: Self-pay | Admitting: Obstetrics and Gynecology

## 2022-03-17 VITALS — BP 118/58 | Ht 60.0 in | Wt 118.0 lb

## 2022-03-17 DIAGNOSIS — N812 Incomplete uterovaginal prolapse: Secondary | ICD-10-CM

## 2022-03-17 DIAGNOSIS — R3915 Urgency of urination: Secondary | ICD-10-CM

## 2022-03-17 DIAGNOSIS — Z9889 Other specified postprocedural states: Secondary | ICD-10-CM

## 2022-03-17 NOTE — Telephone Encounter (Signed)
Referral placed a [facility] location they will call to schedule.

## 2022-03-17 NOTE — Telephone Encounter (Signed)
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   My patient has incomplete uterovaginal prolapse, urinary urgency.  She currently uses a pessary.

## 2022-03-18 ENCOUNTER — Other Ambulatory Visit: Payer: Self-pay

## 2022-03-18 ENCOUNTER — Encounter: Payer: Self-pay | Admitting: Physical Therapy

## 2022-03-18 ENCOUNTER — Ambulatory Visit: Payer: BC Managed Care – PPO | Attending: Obstetrics and Gynecology | Admitting: Physical Therapy

## 2022-03-18 DIAGNOSIS — R293 Abnormal posture: Secondary | ICD-10-CM | POA: Diagnosis not present

## 2022-03-18 DIAGNOSIS — M6281 Muscle weakness (generalized): Secondary | ICD-10-CM | POA: Insufficient documentation

## 2022-03-18 DIAGNOSIS — R279 Unspecified lack of coordination: Secondary | ICD-10-CM | POA: Insufficient documentation

## 2022-03-18 DIAGNOSIS — R3915 Urgency of urination: Secondary | ICD-10-CM | POA: Insufficient documentation

## 2022-03-18 DIAGNOSIS — N812 Incomplete uterovaginal prolapse: Secondary | ICD-10-CM | POA: Diagnosis not present

## 2022-03-18 NOTE — Addendum Note (Signed)
Addended by: Junie Panning on: 03/18/2022 12:31 PM   Modules accepted: Orders

## 2022-03-18 NOTE — Therapy (Signed)
OUTPATIENT PHYSICAL THERAPY FEMALE PELVIC EVALUATION   Patient Name: Brittany Barton MRN: [id number] DOB:1961/07/17, 61 y.o., female Today's Date: 03/18/2022   PT End of Session - 03/18/22 1009     Visit Number 1    Date for PT Re-Evaluation 06/18/22    Authorization Type BCBS    PT Start Time 1015    PT Stop Time 1101    PT Time Calculation (min) 46 min    Activity Tolerance Patient tolerated treatment well    Behavior During Therapy [facility] for tasks assessed/performed             Past Medical History:  Diagnosis Date   Anemia    Chicken pox    COVID    may 2022 cold s/s cough . blood sugars out of control.   Depression    Resolved. in 65s- meds and counseling.   Diabetes mellitus ([place])    Family history of hemochromatosis    father- screen iron   Gestational diabetes 09/08/1985   a1c q3 years   Hammer toe    Lichen planus    Sees [facility] dermatology - Dr. Particia Nearing   Morton's neuroma    per records,pt.denies 10/15/21   Wears glasses    Past Surgical History:  Procedure Laterality Date   DILATATION & CURETTAGE/HYSTEROSCOPY WITH MYOSURE N/A 03/03/2022   Procedure: hysterscopic myomectomy dilation and curretage;  Surgeon: Nunzio Cobbs, MD;  Location: [facility];  Service: Gynecology;  Laterality: N/A;  hysterscopic myomectomy dilation and curretage   ENDOMETRIAL ABLATION  2010   heavy menstrual bleeding   TONSILLECTOMY AND ADENOIDECTOMY  1965   Patient Active Problem List   Diagnosis Date Noted   COVID-19 virus infection 01/25/2021   Hyponatremia 10/16/2020   LADA (latent autoimmune diabetes of adulthood) ([place]) 11/23/2018   Fever blister 08/04/2016   Hyperlipidemia 08/04/2016   Depression    Family history of hemochromatosis     PCP: Marin Olp, MD  REFERRING PROVIDER: Nunzio Cobbs, MD  REFERRING DIAG: N81.2 (ICD-10-CM) - Incomplete uterovaginal prolapse R39.15 (ICD-10-CM) - Urinary urgency  THERAPY  DIAG:  Muscle weakness (generalized)  Unspecified lack of coordination  Abnormal posture  Rationale for Evaluation and Treatment Rehabilitation  ONSET DATE: prolapse started 3 years ago  SUBJECTIVE:                                                                                                                                                                                           SUBJECTIVE STATEMENT: Pt reports she has a prolapse of rectocele, uterine and bladder was first diagnosed with prolapse  3 years. Pt does have a pessary, not currently wearing it. Doesn't have prolapse symptoms with pessary and aware if she feels pressure, but without pessary does have a bulge all the time   Fluid intake: Yes: 8 glasses of water per day, coffee in morning 1 cup, sometimes one cup of green tea     PAIN:  Are you having pain? No   PRECAUTIONS: None  WEIGHT BEARING RESTRICTIONS No  FALLS:  Has patient fallen in last 6 months? No  LIVING ENVIRONMENT: Lives with: lives with their family Lives in: House/apartment   OCCUPATION: retired  PLOF: Independent  PATIENT GOALS to have less prolapse symptoms.   PERTINENT HISTORY:  endometrial ablation with postmenopausal bleeding, endometrial mass 2.5 x 1.3 cm, hysteroscopy with dilation and curettage and Myosurgical resection of endometrial mass 03/03/22 Sexual abuse: No  BOWEL MOVEMENT Pain with bowel movement: No Type of bowel movement:Type (Bristol Stool Scale) 4, Frequency daily, and Strain No Fully empty rectum: Yes:   Leakage: No Pads: No Fiber supplement: No  URINATION Pain with urination: No Fully empty bladder: Yes:   Stream: Strong and Weak Urgency: Yes:   Frequency: nighttime 1-4x; day time not quicker than every 2 hours Leakage:  not getting to toilet quick enough but that is rare Pads: No  INTERCOURSE Pain with intercourse: Initial Penetration and During Penetration Ability to have vaginal penetration:  Yes:    Climax: not painful Marinoff Scale: 0/3 Does report dryness   PREGNANCY Vaginal deliveries 2 Tearing Yes: episiotomy with first; doesn't think she had tearing with second C-section deliveries 0 Currently pregnant No  PROLAPSE Cystocele  , Urethrocele  , and Rectocele    per pt    OBJECTIVE:   DIAGNOSTIC FINDINGS:    COGNITION:  Overall cognitive status: Within functional limits for tasks assessed     SENSATION:  Light touch: Appears intact  Proprioception: Appears intact  MUSCLE LENGTH: Bil adductors and hamstrings limited by 25%                POSTURE: No Significant postural limitations   LUMBARAROM/PROM  A/PROM A/PROM  eval  Flexion WFL  Extension WFL  Right lateral flexion Limited by 25%  Left lateral flexion Limited by 25%  Right rotation WFL  Left rotation WFL   (Blank rows = not tested)  LOWER EXTREMITY ROM:  WFL  LOWER EXTREMITY MMT:  Bil hip abduction 3+5, flexion and extension 4/5; knees and ankles 5/5   PALPATION:   General  no TTP                External Perineal Exam WFL, dryness noted at vulva                             Internal Pelvic Floor no TTP  Patient confirms identification and approves PT to assess internal pelvic floor and treatment Yes  PELVIC MMT:   MMT eval  Vaginal 3/5 improved to 4/5 with exhale coordinated with contraction; 8s isometrics, 5 reps  Internal Anal Sphincter   External Anal Sphincter   Puborectalis   Diastasis Recti   (Blank rows = not tested)        TONE: Mildly decreased  PROLAPSE: Anterior wall laxity noted in hooklying with strong cough x5  possible grade 2 based on descent of tissue; posterior wall laxity noted as well possible grade 1  TODAY'S TREATMENT  EVAL Examination completed, findings reviewed, pt educated on  POC, HEP, and vaginal lubricants and moisturizers, and prolapse relief position. Pt motivated to participate in PT and agreeable to attempt recommendations.     PATIENT  EDUCATION:  Education details: [id number] Person educated: Patient Education method: Education officer, environmental, Corporate treasurer cues, Verbal cues, and Handouts Education comprehension: verbalized understanding and returned demonstration   HOME EXERCISE PROGRAM: [id number]  ASSESSMENT:  CLINICAL IMPRESSION: Patient is a 61 y.o. female  who was seen today for physical therapy evaluation and treatment for prolapse and urinary urgency. Pt has history of x2 vaginal births one with episiotomy, and recent hysteroscopy with dilation and curettage with endometrial mass removed. Pt reports she had pessary for prolapse for 3 years but thinks it doesn't fit as well anymore. Pt presents without pessary in place today. Pt found to have decreased spinal and hip flexibility and mildly decreased bil hip strength and decrease core strength, no TTP or noted restrictions in abdomen or at pelvic region. Pt consented to internal vaginal assessment this date and found to have decreased strength, coordination, and endurance and did have anterior and posterior wall laxity. Pt would benefit from additional PT to further address deficits.     OBJECTIVE IMPAIRMENTS decreased coordination, decreased endurance, decreased strength, impaired flexibility, improper body mechanics, and postural dysfunction.   ACTIVITY LIMITATIONS carrying, lifting, squatting, and continence  PARTICIPATION LIMITATIONS: interpersonal relationship and community activity  PERSONAL FACTORS Fitness, Time since onset of injury/illness/exacerbation, and 1 comorbidity: medical history  are also affecting patient's functional outcome.   REHAB POTENTIAL: Good  CLINICAL DECISION MAKING: Stable/uncomplicated  EVALUATION COMPLEXITY: Low   GOALS: Goals reviewed with patient? Yes  SHORT TERM GOALS: Target date: 04/15/2022  Pt to be I with HEP.  Baseline: Goal status: INITIAL  2.  Pt will have 25% less urgency due to bladder retraining and strengthening   Baseline:  Goal status: INITIAL  3.  Pt to demonstrate at least 4/5 pelvic floor strength for improved pelvic stability and decreased strain at pelvic floor.  Baseline:  Goal status: INITIAL  4.  Pt to be I with voiding and breathing mechanics to decrease strain at prolapse and pelvic floor  Baseline:  Goal status: INITIAL    LONG TERM GOALS: Target date:  06/18/22    Pt to be I with advanced HEP.  Baseline:  Goal status: INITIAL  2.  Pt will have 50% less urgency due to bladder retraining and strengthening  Baseline:  Goal status: INITIAL  3.  Pt to demonstrate at least 4/5 pelvic floor strength with ability to hold contraction for 8s for improved pelvic stability and decreased strain at pelvic floor/ decrease leakage.  Baseline:  Goal status: INITIAL  4.  Pt to demonstrate at least 5/5 bil hip strength for improved pelvic stability and functional squats without leakage.  Baseline:  Goal status: INITIAL  5.  Pt to demonstrate improved coordination of pelvic floor and breathing mechanics for squatting at least 20# without prolapse symptoms for improved pelvic stability and decreased stress at pelvic floor.  Baseline:  Goal status: INITIAL    PLAN: PT FREQUENCY: 1x/week  PT DURATION:  8 sessions  PLANNED INTERVENTIONS: Therapeutic exercises, Therapeutic activity, Neuromuscular re-education, Patient/Family education, Joint mobilization, Aquatic Therapy, Dry Needling, Spinal mobilization, Cryotherapy, Moist heat, scar mobilization, Taping, Biofeedback, and Manual therapy  PLAN FOR NEXT SESSION: coordination of pelvic floor and breathing mechanics   Stacy Gardner, PT, DPT 03/19/2311:30 PM

## 2022-03-18 NOTE — Patient Instructions (Signed)

## 2022-03-20 NOTE — Telephone Encounter (Signed)
Patient scheduled on 03/26/22

## 2022-03-26 ENCOUNTER — Ambulatory Visit: Payer: BC Managed Care – PPO | Admitting: Physical Therapy

## 2022-03-26 DIAGNOSIS — M6281 Muscle weakness (generalized): Secondary | ICD-10-CM | POA: Diagnosis not present

## 2022-03-26 DIAGNOSIS — R293 Abnormal posture: Secondary | ICD-10-CM

## 2022-03-26 DIAGNOSIS — R279 Unspecified lack of coordination: Secondary | ICD-10-CM

## 2022-03-26 NOTE — Therapy (Signed)
OUTPATIENT PHYSICAL THERAPY FEMALE PELVIC TREATMENT   Patient Name: Brittany Barton MRN: [id number] DOB:1961/03/07, 61 y.o., female Today's Date: 03/26/2022   PT End of Session - 03/26/22 0749     Visit Number 2    Date for PT Re-Evaluation 06/18/22    Authorization Type BCBS    PT Start Time 0800    PT Stop Time 0840    PT Time Calculation (min) 40 min    Activity Tolerance Patient tolerated treatment well    Behavior During Therapy [facility] for tasks assessed/performed              Past Medical History:  Diagnosis Date   Anemia    Chicken pox    COVID    may 2022 cold s/s cough . blood sugars out of control.   Depression    Resolved. in 74s- meds and counseling.   Diabetes mellitus ([place])    Family history of hemochromatosis    father- screen iron   Gestational diabetes 09/08/1985   a1c q3 years   Hammer toe    Lichen planus    Sees [facility] - Dr. Particia Nearing   Morton's neuroma    per records,pt.denies 10/15/21   Wears glasses    Past Surgical History:  Procedure Laterality Date   DILATATION & CURETTAGE/HYSTEROSCOPY WITH MYOSURE N/A 03/03/2022   Procedure: hysterscopic myomectomy dilation and curretage;  Surgeon: Nunzio Cobbs, MD;  Location: [facility];  Service: Gynecology;  Laterality: N/A;  hysterscopic myomectomy dilation and curretage   ENDOMETRIAL ABLATION  2010   heavy menstrual bleeding   TONSILLECTOMY AND ADENOIDECTOMY  1965   Patient Active Problem List   Diagnosis Date Noted   COVID-19 virus infection 01/25/2021   Hyponatremia 10/16/2020   LADA (latent autoimmune diabetes of adulthood) ([place]) 11/23/2018   Fever blister 08/04/2016   Hyperlipidemia 08/04/2016   Depression    Family history of hemochromatosis     PCP: Marin Olp, MD  REFERRING PROVIDER: Nunzio Cobbs, MD  REFERRING DIAG: N81.2 (ICD-10-CM) - Incomplete uterovaginal prolapse R39.15 (ICD-10-CM) - Urinary urgency  THERAPY  DIAG:  Muscle weakness (generalized)  Unspecified lack of coordination  Abnormal posture  Rationale for Evaluation and Treatment Rehabilitation  ONSET DATE: prolapse started 3 years ago  SUBJECTIVE:                                                                                                                                                                                           SUBJECTIVE STATEMENT: Pt reports she has been doing relief positions nightly, did try moisturizer as well and  unsure if their was any change. Lubricants have been helpful.    Fluid intake: Yes: 8 glasses of water per day, coffee in morning 1 cup, sometimes one cup of green tea     PAIN:  Are you having pain? No   PLOF: Independent  PATIENT GOALS to have less prolapse symptoms.   PERTINENT HISTORY:  endometrial ablation with postmenopausal bleeding, endometrial mass 2.5 x 1.3 cm, hysteroscopy with dilation and curettage and Myosurgical resection of endometrial mass 03/03/22 Sexual abuse: No  BOWEL MOVEMENT Pain with bowel movement: No Type of bowel movement:Type (Bristol Stool Scale) 4, Frequency daily, and Strain No Fully empty rectum: Yes:   Leakage: No Pads: No Fiber supplement: No  URINATION Pain with urination: No Fully empty bladder: Yes:   Stream: Strong and Weak Urgency: Yes:   Frequency: nighttime 1-4x; day time not quicker than every 2 hours Leakage:  not getting to toilet quick enough but that is rare Pads: No  INTERCOURSE Pain with intercourse: Initial Penetration and During Penetration Ability to have vaginal penetration:  Yes:   Climax: not painful Marinoff Scale: 0/3 Does report dryness   PREGNANCY Vaginal deliveries 2 Tearing Yes: episiotomy with first; doesn't think she had tearing with second C-section deliveries 0 Currently pregnant No  PROLAPSE Cystocele  , Urethrocele  , and Rectocele    per pt    OBJECTIVE:   DIAGNOSTIC FINDINGS:     COGNITION:  Overall cognitive status: Within functional limits for tasks assessed     SENSATION:  Light touch: Appears intact  Proprioception: Appears intact  MUSCLE LENGTH: Bil adductors and hamstrings limited by 25%                POSTURE: No Significant postural limitations   LUMBARAROM/PROM  A/PROM A/PROM  eval  Flexion WFL  Extension WFL  Right lateral flexion Limited by 25%  Left lateral flexion Limited by 25%  Right rotation WFL  Left rotation WFL   (Blank rows = not tested)  LOWER EXTREMITY ROM:  WFL  LOWER EXTREMITY MMT:  Bil hip abduction 3+5, flexion and extension 4/5; knees and ankles 5/5   PALPATION:   General  no TTP                External Perineal Exam WFL, dryness noted at vulva                             Internal Pelvic Floor no TTP  Patient confirms identification and approves PT to assess internal pelvic floor and treatment Yes  PELVIC MMT:   MMT eval  Vaginal 3/5 improved to 4/5 with exhale coordinated with contraction; 8s isometrics, 5 reps  Internal Anal Sphincter   External Anal Sphincter   Puborectalis   Diastasis Recti   (Blank rows = not tested)        TONE: Mildly decreased  PROLAPSE: Anterior wall laxity noted in hooklying with strong cough x5  possible grade 2 based on descent of tissue; posterior wall laxity noted as well possible grade 1  TODAY'S TREATMENT   03/26/2022: Constance Haw with Lazare squeezes 2x10  Opp hand/knee Benge press 2x10 Hip abduction with Thies press 2x10 Squats 2x10 10# One leg on bosu squats x10 each Red band palloffs x10 Red band rotational palloffs x10  EVAL Examination completed, findings reviewed, pt educated on POC, HEP, and vaginal lubricants and moisturizers, and prolapse relief position. Pt motivated to participate in PT and  agreeable to attempt recommendations.     PATIENT EDUCATION:  Education details: [id number] Person educated: Patient Education method: Education officer, environmental,  Corporate treasurer cues, Verbal cues, and Handouts Education comprehension: verbalized understanding and returned demonstration   HOME EXERCISE PROGRAM: [id number]  ASSESSMENT:  CLINICAL IMPRESSION: Patient session focused on hip and core and strengthening with coordination of pelvic floor and breathing mechanics throughout. Pt benefited from cues with all exercises for technique and coordination of breathing. Pt tolerated well and denied all symptoms of prolapse during session. Pt would benefit from additional PT to further address deficits.     OBJECTIVE IMPAIRMENTS decreased coordination, decreased endurance, decreased strength, impaired flexibility, improper body mechanics, and postural dysfunction.   ACTIVITY LIMITATIONS carrying, lifting, squatting, and continence  PARTICIPATION LIMITATIONS: interpersonal relationship and community activity  PERSONAL FACTORS Fitness, Time since onset of injury/illness/exacerbation, and 1 comorbidity: medical history  are also affecting patient's functional outcome.   REHAB POTENTIAL: Good  CLINICAL DECISION MAKING: Stable/uncomplicated  EVALUATION COMPLEXITY: Low   GOALS: Goals reviewed with patient? Yes  SHORT TERM GOALS: Target date: 04/15/2022  Pt to be I with HEP.  Baseline: Goal status: INITIAL  2.  Pt will have 25% less urgency due to bladder retraining and strengthening  Baseline:  Goal status: INITIAL  3.  Pt to demonstrate at least 4/5 pelvic floor strength for improved pelvic stability and decreased strain at pelvic floor.  Baseline:  Goal status: INITIAL  4.  Pt to be I with voiding and breathing mechanics to decrease strain at prolapse and pelvic floor  Baseline:  Goal status: INITIAL    LONG TERM GOALS: Target date:  06/18/22    Pt to be I with advanced HEP.  Baseline:  Goal status: INITIAL  2.  Pt will have 50% less urgency due to bladder retraining and strengthening  Baseline:  Goal status: INITIAL  3.  Pt to  demonstrate at least 4/5 pelvic floor strength with ability to hold contraction for 8s for improved pelvic stability and decreased strain at pelvic floor/ decrease leakage.  Baseline:  Goal status: INITIAL  4.  Pt to demonstrate at least 5/5 bil hip strength for improved pelvic stability and functional squats without leakage.  Baseline:  Goal status: INITIAL  5.  Pt to demonstrate improved coordination of pelvic floor and breathing mechanics for squatting at least 20# without prolapse symptoms for improved pelvic stability and decreased stress at pelvic floor.  Baseline:  Goal status: INITIAL    PLAN: PT FREQUENCY: 1x/week  PT DURATION:  8 sessions  PLANNED INTERVENTIONS: Therapeutic exercises, Therapeutic activity, Neuromuscular re-education, Patient/Family education, Joint mobilization, Aquatic Therapy, Dry Needling, Spinal mobilization, Cryotherapy, Moist heat, scar mobilization, Taping, Biofeedback, and Manual therapy  PLAN FOR NEXT SESSION: coordination of pelvic floor and breathing mechanics   Stacy Gardner, PT, DPT 07/19/238:43 AM

## 2022-04-02 ENCOUNTER — Ambulatory Visit: Payer: BC Managed Care – PPO | Admitting: Physical Therapy

## 2022-04-02 DIAGNOSIS — M6281 Muscle weakness (generalized): Secondary | ICD-10-CM

## 2022-04-02 DIAGNOSIS — R293 Abnormal posture: Secondary | ICD-10-CM

## 2022-04-02 DIAGNOSIS — R279 Unspecified lack of coordination: Secondary | ICD-10-CM

## 2022-04-02 NOTE — Therapy (Signed)
OUTPATIENT PHYSICAL THERAPY FEMALE PELVIC TREATMENT   Patient Name: Brittany Barton MRN: [id number] DOB:[birth year], 61 y.o., female Today's Date: 04/02/2022   PT End of Session - 04/02/22 0845     Visit Number 3    Date for PT Re-Evaluation 06/18/22    Authorization Type BCBS    PT Start Time 0845    PT Stop Time 0928    PT Time Calculation (min) 43 min    Activity Tolerance Patient tolerated treatment well    Behavior During Therapy [facility] for tasks assessed/performed              Past Medical History:  Diagnosis Date   Anemia    Chicken pox    COVID    may 2022 cold s/s cough . blood sugars out of control.   Depression    Resolved. in 95s- meds and counseling.   Diabetes mellitus ([place])    Family history of hemochromatosis    father- screen iron   Gestational diabetes 09/08/1985   a1c q3 years   Hammer toe    Lichen planus    Sees [facility] dermatology - Dr. Particia Nearing   Morton's neuroma    per records,pt.denies 10/15/21   Wears glasses    Past Surgical History:  Procedure Laterality Date   DILATATION & CURETTAGE/HYSTEROSCOPY WITH MYOSURE N/A 03/03/2022   Procedure: hysterscopic myomectomy dilation and curretage;  Surgeon: Nunzio Cobbs, MD;  Location: [facility];  Service: Gynecology;  Laterality: N/A;  hysterscopic myomectomy dilation and curretage   ENDOMETRIAL ABLATION  2010   heavy menstrual bleeding   TONSILLECTOMY AND ADENOIDECTOMY  1965   Patient Active Problem List   Diagnosis Date Noted   COVID-19 virus infection 01/25/2021   Hyponatremia 10/16/2020   LADA (latent autoimmune diabetes of adulthood) ([place]) 11/23/2018   Fever blister 08/04/2016   Hyperlipidemia 08/04/2016   Depression    Family history of hemochromatosis     PCP: Marin Olp, MD  REFERRING PROVIDER: Nunzio Cobbs, MD  REFERRING DIAG: N81.2 (ICD-10-CM) - Incomplete uterovaginal prolapse R39.15 (ICD-10-CM) - Urinary urgency  THERAPY  DIAG:  Muscle weakness (generalized)  Unspecified lack of coordination  Abnormal posture  Rationale for Evaluation and Treatment Rehabilitation  ONSET DATE: prolapse started 3 years ago  SUBJECTIVE:                                                                                                                                                                                           SUBJECTIVE STATEMENT: Pt reports she is able to tell she is getting stronger now.   Fluid  intake: Yes: 8 glasses of water per day, coffee in morning 1 cup, sometimes one cup of green tea     PAIN:  Are you having pain? No   PLOF: Independent  PATIENT GOALS to have less prolapse symptoms.   PERTINENT HISTORY:  endometrial ablation with postmenopausal bleeding, endometrial mass 2.5 x 1.3 cm, hysteroscopy with dilation and curettage and Myosurgical resection of endometrial mass 03/03/22 Sexual abuse: No  BOWEL MOVEMENT Pain with bowel movement: No Type of bowel movement:Type (Bristol Stool Scale) 4, Frequency daily, and Strain No Fully empty rectum: Yes:   Leakage: No Pads: No Fiber supplement: No  URINATION Pain with urination: No Fully empty bladder: Yes:   Stream: Strong and Weak Urgency: Yes:   Frequency: nighttime 1-4x; day time not quicker than every 2 hours Leakage:  not getting to toilet quick enough but that is rare Pads: No  INTERCOURSE Pain with intercourse: Initial Penetration and During Penetration Ability to have vaginal penetration:  Yes:   Climax: not painful Marinoff Scale: 0/3 Does report dryness   PREGNANCY Vaginal deliveries 2 Tearing Yes: episiotomy with first; doesn't think she had tearing with second C-section deliveries 0 Currently pregnant No  PROLAPSE Cystocele  , Urethrocele  , and Rectocele    per pt    OBJECTIVE:   DIAGNOSTIC FINDINGS:    COGNITION:  Overall cognitive status: Within functional limits for tasks  assessed     SENSATION:  Light touch: Appears intact  Proprioception: Appears intact  MUSCLE LENGTH: Bil adductors and hamstrings limited by 25%                POSTURE: No Significant postural limitations   LUMBARAROM/PROM  A/PROM A/PROM  eval  Flexion WFL  Extension WFL  Right lateral flexion Limited by 25%  Left lateral flexion Limited by 25%  Right rotation WFL  Left rotation WFL   (Blank rows = not tested)  LOWER EXTREMITY ROM:  WFL  LOWER EXTREMITY MMT:  Bil hip abduction 3+5, flexion and extension 4/5; knees and ankles 5/5   PALPATION:   General  no TTP                External Perineal Exam WFL, dryness noted at vulva                             Internal Pelvic Floor no TTP  Patient confirms identification and approves PT to assess internal pelvic floor and treatment Yes  PELVIC MMT:   MMT eval  Vaginal 3/5 improved to 4/5 with exhale coordinated with contraction; 8s isometrics, 5 reps  Internal Anal Sphincter   External Anal Sphincter   Puborectalis   Diastasis Recti   (Blank rows = not tested)        TONE: Mildly decreased  PROLAPSE: Anterior wall laxity noted in hooklying with strong cough x5  possible grade 2 based on descent of tissue; posterior wall laxity noted as well possible grade 1  TODAY'S TREATMENT   04/02/2022: NMRE: all exercises cued for coordination of pelvic floor and breathing as well as core activation for decreased strain at prolapse and pelvic floor  Bridges with Swinson squeezes 2x10  Opp hand/knee Sensabaugh press 2x10 Hip abduction with [place] press 2x10 Squats 10# 2x10 Mario punches 2x10 6# Bosu: one leg on ground one on [place] x10 squats; x10 mini lunges Palloffs at power tower 5# x10 each Rotational palloffs at power tower 5# x10 each  03/26/2022: Bridges with Berlanga squeezes 2x10  Opp hand/knee Nakayama press 2x10 Hip abduction with Greeno press 2x10 Squats 2x10 10# One leg on bosu squats x10 each Red band palloffs x10 Red  band rotational palloffs x10    PATIENT EDUCATION:  Education details: [id number] Person educated: Patient Education method: Consulting civil engineer, Media planner, Corporate treasurer cues, Verbal cues, and Handouts Education comprehension: verbalized understanding and returned demonstration   HOME EXERCISE PROGRAM: [id number]  ASSESSMENT:  CLINICAL IMPRESSION: Patient session focused on hip and core and strengthening with coordination of pelvic floor and breathing mechanics throughout. Pt benefited from cues with all exercises for technique and coordination of breathing. Pt tolerated well and denied all symptoms of prolapse during session. Pt's HEP updated, all reviewed and pt continues to need skilled intervention for correction and activation of core/pelvic floor.  Pt would benefit from additional PT to further address deficits.     OBJECTIVE IMPAIRMENTS decreased coordination, decreased endurance, decreased strength, impaired flexibility, improper body mechanics, and postural dysfunction.   ACTIVITY LIMITATIONS carrying, lifting, squatting, and continence  PARTICIPATION LIMITATIONS: interpersonal relationship and community activity  PERSONAL FACTORS Fitness, Time since onset of injury/illness/exacerbation, and 1 comorbidity: medical history  are also affecting patient's functional outcome.   REHAB POTENTIAL: Good  CLINICAL DECISION MAKING: Stable/uncomplicated  EVALUATION COMPLEXITY: Low   GOALS: Goals reviewed with patient? Yes  SHORT TERM GOALS: Target date: 04/15/2022  Pt to be I with HEP.  Baseline: Goal status: INITIAL  2.  Pt will have 25% less urgency due to bladder retraining and strengthening  Baseline:  Goal status: INITIAL  3.  Pt to demonstrate at least 4/5 pelvic floor strength for improved pelvic stability and decreased strain at pelvic floor.  Baseline:  Goal status: INITIAL  4.  Pt to be I with voiding and breathing mechanics to decrease strain at prolapse and pelvic floor   Baseline:  Goal status: INITIAL    LONG TERM GOALS: Target date:  06/18/22    Pt to be I with advanced HEP.  Baseline:  Goal status: INITIAL  2.  Pt will have 50% less urgency due to bladder retraining and strengthening  Baseline:  Goal status: INITIAL  3.  Pt to demonstrate at least 4/5 pelvic floor strength with ability to hold contraction for 8s for improved pelvic stability and decreased strain at pelvic floor/ decrease leakage.  Baseline:  Goal status: INITIAL  4.  Pt to demonstrate at least 5/5 bil hip strength for improved pelvic stability and functional squats without leakage.  Baseline:  Goal status: INITIAL  5.  Pt to demonstrate improved coordination of pelvic floor and breathing mechanics for squatting at least 20# without prolapse symptoms for improved pelvic stability and decreased stress at pelvic floor.  Baseline:  Goal status: INITIAL    PLAN: PT FREQUENCY: 1x/week  PT DURATION:  8 sessions  PLANNED INTERVENTIONS: Therapeutic exercises, Therapeutic activity, Neuromuscular re-education, Patient/Family education, Joint mobilization, Aquatic Therapy, Dry Needling, Spinal mobilization, Cryotherapy, Moist heat, scar mobilization, Taping, Biofeedback, and Manual therapy  PLAN FOR NEXT SESSION: coordination of pelvic floor and breathing mechanics   Stacy Gardner, PT, DPT 07/26/239:30 AM

## 2022-04-16 ENCOUNTER — Ambulatory Visit: Payer: BC Managed Care – PPO | Attending: Obstetrics and Gynecology | Admitting: Physical Therapy

## 2022-04-16 DIAGNOSIS — R279 Unspecified lack of coordination: Secondary | ICD-10-CM | POA: Diagnosis present

## 2022-04-16 DIAGNOSIS — M6281 Muscle weakness (generalized): Secondary | ICD-10-CM | POA: Insufficient documentation

## 2022-04-16 DIAGNOSIS — R293 Abnormal posture: Secondary | ICD-10-CM | POA: Insufficient documentation

## 2022-04-16 NOTE — Therapy (Signed)
OUTPATIENT PHYSICAL THERAPY FEMALE PELVIC TREATMENT   Patient Name: Brittany Barton MRN: [id number] DOB:[birth year], 61 y.o., female Today's Date: 04/16/2022   PT End of Session - 04/16/22 1402     Visit Number 4    Date for PT Re-Evaluation 06/18/22    Authorization Type BCBS    PT Start Time 1400    PT Stop Time 1443    PT Time Calculation (min) 43 min    Activity Tolerance Patient tolerated treatment well    Behavior During Therapy [facility] for tasks assessed/performed              Past Medical History:  Diagnosis Date   Anemia    Chicken pox    COVID    may 2022 cold s/s cough . blood sugars out of control.   Depression    Resolved. in 9s- meds and counseling.   Diabetes mellitus ([place])    Family history of hemochromatosis    father- screen iron   Gestational diabetes 09/08/1985   a1c q3 years   Hammer toe    Lichen planus    Sees [facility] dermatology - Dr. Particia Nearing   Morton's neuroma    per records,pt.denies 10/15/21   Wears glasses    Past Surgical History:  Procedure Laterality Date   DILATATION & CURETTAGE/HYSTEROSCOPY WITH MYOSURE N/A 03/03/2022   Procedure: hysterscopic myomectomy dilation and curretage;  Surgeon: Nunzio Cobbs, MD;  Location: [facility];  Service: Gynecology;  Laterality: N/A;  hysterscopic myomectomy dilation and curretage   ENDOMETRIAL ABLATION  2010   heavy menstrual bleeding   TONSILLECTOMY AND ADENOIDECTOMY  1965   Patient Active Problem List   Diagnosis Date Noted   COVID-19 virus infection 01/25/2021   Hyponatremia 10/16/2020   LADA (latent autoimmune diabetes of adulthood) ([place]) 11/23/2018   Fever blister 08/04/2016   Hyperlipidemia 08/04/2016   Depression    Family history of hemochromatosis     PCP: Marin Olp, MD  REFERRING PROVIDER: Nunzio Cobbs, MD  REFERRING DIAG: N81.2 (ICD-10-CM) - Incomplete uterovaginal prolapse R39.15 (ICD-10-CM) - Urinary urgency  THERAPY  DIAG:  Muscle weakness (generalized)  Unspecified lack of coordination  Abnormal posture  Rationale for Evaluation and Treatment Rehabilitation  ONSET DATE: prolapse started 3 years ago  SUBJECTIVE:                                                                                                                                                                                           SUBJECTIVE STATEMENT: Pt reports she is able to tell she is getting stronger now.   Fluid  intake: Yes: 8 glasses of water per day, coffee in morning 1 cup, sometimes one cup of green tea     PAIN:  Are you having pain? No   PLOF: Independent  PATIENT GOALS to have less prolapse symptoms.   PERTINENT HISTORY:  endometrial ablation with postmenopausal bleeding, endometrial mass 2.5 x 1.3 cm, hysteroscopy with dilation and curettage and Myosurgical resection of endometrial mass 03/03/22 Sexual abuse: No  BOWEL MOVEMENT Pain with bowel movement: No Type of bowel movement:Type (Bristol Stool Scale) 4, Frequency daily, and Strain No Fully empty rectum: Yes:   Leakage: No Pads: No Fiber supplement: No  URINATION Pain with urination: No Fully empty bladder: Yes:   Stream: Strong and Weak Urgency: Yes:   Frequency: nighttime 1-4x; day time not quicker than every 2 hours Leakage:  not getting to toilet quick enough but that is rare Pads: No  INTERCOURSE Pain with intercourse: Initial Penetration and During Penetration Ability to have vaginal penetration:  Yes:   Climax: not painful Marinoff Scale: 0/3 Does report dryness   PREGNANCY Vaginal deliveries 2 Tearing Yes: episiotomy with first; doesn't think she had tearing with second C-section deliveries 0 Currently pregnant No  PROLAPSE Cystocele  , Urethrocele  , and Rectocele    per pt    OBJECTIVE:   DIAGNOSTIC FINDINGS:    COGNITION:  Overall cognitive status: Within functional limits for tasks  assessed     SENSATION:  Light touch: Appears intact  Proprioception: Appears intact  MUSCLE LENGTH: Bil adductors and hamstrings limited by 25%                POSTURE: No Significant postural limitations   LUMBARAROM/PROM  A/PROM A/PROM  eval  Flexion WFL  Extension WFL  Right lateral flexion Limited by 25%  Left lateral flexion Limited by 25%  Right rotation WFL  Left rotation WFL   (Blank rows = not tested)  LOWER EXTREMITY ROM:  WFL  LOWER EXTREMITY MMT:  Bil hip abduction 3+5, flexion and extension 4/5; knees and ankles 5/5   PALPATION:   General  no TTP                External Perineal Exam WFL, dryness noted at vulva                             Internal Pelvic Floor no TTP  Patient confirms identification and approves PT to assess internal pelvic floor and treatment Yes  PELVIC MMT:   MMT eval  Vaginal 3/5 improved to 4/5 with exhale coordinated with contraction; 8s isometrics, 5 reps  Internal Anal Sphincter   External Anal Sphincter   Puborectalis   Diastasis Recti   (Blank rows = not tested)        TONE: Mildly decreased  PROLAPSE: Anterior wall laxity noted in hooklying with strong cough x5  possible grade 2 based on descent of tissue; posterior wall laxity noted as well possible grade 1  TODAY'S TREATMENT   04/16/22: NMRE: all exercises cued for coordination of pelvic floor and breathing as well as core activation for decreased strain at prolapse and pelvic floor  Elliptical 5 mins L3  Dead bugs x10 each blue band  Beginner side planks x10 with hip dips Palloffs blue band x20  Rotational palloffs blue band x20 Squats 15# x10 Bosu: one leg on ground one on [place] x10 squats; x10 lunges Farmer's carry 10# and 15# 1000'  04/02/2022: NMRE: all exercises cued for coordination of pelvic floor and breathing as well as core activation for decreased strain at prolapse and pelvic floor  Bridges with Barcia squeezes 2x10  Opp hand/knee [place]  press 2x10 Hip abduction with Mcbain press 2x10 Squats 10# 2x10 Mario punches 2x10 6# Bosu: one leg on ground one on Javier x10 squats; x10 mini lunges Palloffs at power tower 5# x10 each Rotational palloffs at power tower 5# x10 each     PATIENT EDUCATION:  Education details: [id number] Person educated: Patient Education method: Consulting civil engineer, Media planner, Corporate treasurer cues, Verbal cues, and Handouts Education comprehension: verbalized understanding and returned demonstration   HOME EXERCISE PROGRAM: [id number]  ASSESSMENT:  CLINICAL IMPRESSION: Patient session focused on hip and core and strengthening with coordination of pelvic floor and breathing mechanics throughout. Pt benefited from cues with all exercises for technique and coordination of breathing. Increased challenge today with increased resistance. Pt tolerated well and denied all symptoms of prolapse during session. Pt continues to need skilled intervention for correction and activation of core/pelvic floor.  Pt would benefit from additional PT to further address deficits.     OBJECTIVE IMPAIRMENTS decreased coordination, decreased endurance, decreased strength, impaired flexibility, improper body mechanics, and postural dysfunction.   ACTIVITY LIMITATIONS carrying, lifting, squatting, and continence  PARTICIPATION LIMITATIONS: interpersonal relationship and community activity  PERSONAL FACTORS Fitness, Time since onset of injury/illness/exacerbation, and 1 comorbidity: medical history  are also affecting patient's functional outcome.   REHAB POTENTIAL: Good  CLINICAL DECISION MAKING: Stable/uncomplicated  EVALUATION COMPLEXITY: Low   GOALS: Goals reviewed with patient? Yes  SHORT TERM GOALS: Target date: 04/15/2022  Pt to be I with HEP.  Baseline: Goal status: INITIAL  2.  Pt will have 25% less urgency due to bladder retraining and strengthening  Baseline:  Goal status: INITIAL  3.  Pt to demonstrate at least 4/5  pelvic floor strength for improved pelvic stability and decreased strain at pelvic floor.  Baseline:  Goal status: INITIAL  4.  Pt to be I with voiding and breathing mechanics to decrease strain at prolapse and pelvic floor  Baseline:  Goal status: INITIAL    LONG TERM GOALS: Target date:  06/18/22    Pt to be I with advanced HEP.  Baseline:  Goal status: INITIAL  2.  Pt will have 50% less urgency due to bladder retraining and strengthening  Baseline:  Goal status: INITIAL  3.  Pt to demonstrate at least 4/5 pelvic floor strength with ability to hold contraction for 8s for improved pelvic stability and decreased strain at pelvic floor/ decrease leakage.  Baseline:  Goal status: INITIAL  4.  Pt to demonstrate at least 5/5 bil hip strength for improved pelvic stability and functional squats without leakage.  Baseline:  Goal status: INITIAL  5.  Pt to demonstrate improved coordination of pelvic floor and breathing mechanics for squatting at least 20# without prolapse symptoms for improved pelvic stability and decreased stress at pelvic floor.  Baseline:  Goal status: INITIAL    PLAN: PT FREQUENCY: 1x/week  PT DURATION:  8 sessions  PLANNED INTERVENTIONS: Therapeutic exercises, Therapeutic activity, Neuromuscular re-education, Patient/Family education, Joint mobilization, Aquatic Therapy, Dry Needling, Spinal mobilization, Cryotherapy, Moist heat, scar mobilization, Taping, Biofeedback, and Manual therapy  PLAN FOR NEXT SESSION: coordination of pelvic floor and breathing mechanics   Stacy Gardner, PT, DPT 08/09/232:46 PM

## 2022-04-23 ENCOUNTER — Ambulatory Visit: Payer: BC Managed Care – PPO | Admitting: Physical Therapy

## 2022-04-23 DIAGNOSIS — R279 Unspecified lack of coordination: Secondary | ICD-10-CM

## 2022-04-23 DIAGNOSIS — M6281 Muscle weakness (generalized): Secondary | ICD-10-CM | POA: Diagnosis not present

## 2022-04-23 DIAGNOSIS — R293 Abnormal posture: Secondary | ICD-10-CM

## 2022-04-23 NOTE — Patient Instructions (Signed)

## 2022-04-23 NOTE — Therapy (Signed)
OUTPATIENT PHYSICAL THERAPY FEMALE PELVIC TREATMENT   Patient Name: Brittany Barton MRN: [id number] DOB:1961-04-27, 61 y.o., female Today's Date: 04/23/2022   PT End of Session - 04/23/22 0759     Visit Number 5    Date for PT Re-Evaluation 06/18/22    Authorization Type BCBS    PT Start Time 0800    PT Stop Time 0843    PT Time Calculation (min) 43 min    Activity Tolerance Patient tolerated treatment well    Behavior During Therapy [facility] for tasks assessed/performed              Past Medical History:  Diagnosis Date   Anemia    Chicken pox    COVID    may 2022 cold s/s cough . blood sugars out of control.   Depression    Resolved. in 32s- meds and counseling.   Diabetes mellitus ([place])    Family history of hemochromatosis    father- screen iron   Gestational diabetes 09/08/1985   a1c q3 years   Hammer toe    Lichen planus    Sees [facility] dermatology - Dr. Particia Nearing   Morton's neuroma    per records,pt.denies 10/15/21   Wears glasses    Past Surgical History:  Procedure Laterality Date   DILATATION & CURETTAGE/HYSTEROSCOPY WITH MYOSURE N/A 03/03/2022   Procedure: hysterscopic myomectomy dilation and curretage;  Surgeon: Nunzio Cobbs, MD;  Location: [facility];  Service: Gynecology;  Laterality: N/A;  hysterscopic myomectomy dilation and curretage   ENDOMETRIAL ABLATION  2010   heavy menstrual bleeding   TONSILLECTOMY AND ADENOIDECTOMY  1965   Patient Active Problem List   Diagnosis Date Noted   COVID-19 virus infection 01/25/2021   Hyponatremia 10/16/2020   LADA (latent autoimmune diabetes of adulthood) ([place]) 11/23/2018   Fever blister 08/04/2016   Hyperlipidemia 08/04/2016   Depression    Family history of hemochromatosis     PCP: Marin Olp, MD  REFERRING PROVIDER: Nunzio Cobbs, MD  REFERRING DIAG: N81.2 (ICD-10-CM) - Incomplete uterovaginal prolapse R39.15 (ICD-10-CM) - Urinary urgency  THERAPY  DIAG:  Muscle weakness (generalized)  Unspecified lack of coordination  Abnormal posture  Rationale for Evaluation and Treatment Rehabilitation  ONSET DATE: prolapse started 3 years ago  SUBJECTIVE:                                                                                                                                                                                           SUBJECTIVE STATEMENT: Pt states she can still feel prolapse without pessary in place, "it's better than it  was but still there". Urgency is getting better as well".    Fluid intake: Yes: 8 glasses of water per day, coffee in morning 1 cup, sometimes one cup of green tea     PAIN:  Are you having pain? No   PLOF: Independent  PATIENT GOALS to have less prolapse symptoms.   PERTINENT HISTORY:  endometrial ablation with postmenopausal bleeding, endometrial mass 2.5 x 1.3 cm, hysteroscopy with dilation and curettage and Myosurgical resection of endometrial mass 03/03/22 Sexual abuse: No  BOWEL MOVEMENT Pain with bowel movement: No Type of bowel movement:Type (Bristol Stool Scale) 4, Frequency daily, and Strain No Fully empty rectum: Yes:   Leakage: No Pads: No Fiber supplement: No  URINATION Pain with urination: No Fully empty bladder: Yes:   Stream: Strong and Weak Urgency: Yes:   Frequency: nighttime 1-4x; day time not quicker than every 2 hours Leakage:  not getting to toilet quick enough but that is rare Pads: No  INTERCOURSE Pain with intercourse: Initial Penetration and During Penetration Ability to have vaginal penetration:  Yes:   Climax: not painful Marinoff Scale: 0/3 Does report dryness   PREGNANCY Vaginal deliveries 2 Tearing Yes: episiotomy with first; doesn't think she had tearing with second C-section deliveries 0 Currently pregnant No  PROLAPSE Cystocele  , Urethrocele  , and Rectocele    per pt    OBJECTIVE:   DIAGNOSTIC FINDINGS:    COGNITION:  Overall  cognitive status: Within functional limits for tasks assessed     SENSATION:  Light touch: Appears intact  Proprioception: Appears intact  MUSCLE LENGTH: Bil adductors and hamstrings limited by 25%                POSTURE: No Significant postural limitations   LUMBARAROM/PROM  A/PROM A/PROM  eval  Flexion WFL  Extension WFL  Right lateral flexion Limited by 25%  Left lateral flexion Limited by 25%  Right rotation WFL  Left rotation WFL   (Blank rows = not tested)  LOWER EXTREMITY ROM:  WFL  LOWER EXTREMITY MMT:  Bil hip abduction 3+5, flexion and extension 4/5; knees and ankles 5/5   PALPATION:   General  no TTP                External Perineal Exam WFL, dryness noted at vulva                             Internal Pelvic Floor no TTP  Patient confirms identification and approves PT to assess internal pelvic floor and treatment Yes  PELVIC MMT:   MMT eval  Vaginal 3/5 improved to 4/5 with exhale coordinated with contraction; 8s isometrics, 5 reps  Internal Anal Sphincter   External Anal Sphincter   Puborectalis   Diastasis Recti   (Blank rows = not tested)        TONE: Mildly decreased  PROLAPSE: Anterior wall laxity noted in hooklying with strong cough x5  possible grade 2 based on descent of tissue; posterior wall laxity noted as well possible grade 1  TODAY'S TREATMENT   04/23/2022: NMRE: all exercises cued for coordination of pelvic floor and breathing as well as core activation for decreased strain at prolapse and pelvic floor  Pt educated on urge drill Bridges 2x10 with Lloyd squeezes Opp hand/knee Degregory press 2x10 Blue band dead bugs 2x10 Bear crawl x20 Squats 15# 2x10 Dead lifts 2x10 15# Bosu: one leg on ground one on [place]  x10 squats Palloffs blue band x20  Rotational palloffs blue band x20 X10 cross body standing crunch blue band   04/16/22: NMRE: all exercises cued for coordination of pelvic floor and breathing as well as core activation  for decreased strain at prolapse and pelvic floor  Elliptical 5 mins L3  Dead bugs x10 each blue band  Beginner side planks x10 with hip dips Palloffs blue band x20  Rotational palloffs blue band x20 Squats 15# x10 Bosu: one leg on ground one on [place]hrig x10 squats; x10 lunges Farmer's carry 10# and 15# 1000'     PATIENT EDUCATION:  Education details: [id number] Person educated: Patient Education method: Consulting civil engineer, Media planner, Corporate treasurer cues, Verbal cues, and Handouts Education comprehension: verbalized understanding and returned demonstration   HOME EXERCISE PROGRAM: [id number]  ASSESSMENT:  CLINICAL IMPRESSION: Patient session focused on hip and core and strengthening with coordination of pelvic floor and breathing mechanics throughout. Pt benefited from cues with all exercises for technique and coordination of breathing. Increased challenge today with increased resistance and standing exercises. Pt tolerated well and denied all symptoms of prolapse during session. Pt continues to need skilled intervention for correction and activation of core/pelvic floor.  Pt would benefit from additional PT to further address deficits.     OBJECTIVE IMPAIRMENTS decreased coordination, decreased endurance, decreased strength, impaired flexibility, improper body mechanics, and postural dysfunction.   ACTIVITY LIMITATIONS carrying, lifting, squatting, and continence  PARTICIPATION LIMITATIONS: interpersonal relationship and community activity  PERSONAL FACTORS Fitness, Time since onset of injury/illness/exacerbation, and 1 comorbidity: medical history  are also affecting patient's functional outcome.   REHAB POTENTIAL: Good  CLINICAL DECISION MAKING: Stable/uncomplicated  EVALUATION COMPLEXITY: Low   GOALS: Goals reviewed with patient? Yes  SHORT TERM GOALS: Target date: 04/15/2022  Pt to be I with HEP.  Baseline: Goal status: INITIAL  2.  Pt will have 25% less urgency due to bladder  retraining and strengthening  Baseline:  Goal status: INITIAL  3.  Pt to demonstrate at least 4/5 pelvic floor strength for improved pelvic stability and decreased strain at pelvic floor.  Baseline:  Goal status: INITIAL  4.  Pt to be I with voiding and breathing mechanics to decrease strain at prolapse and pelvic floor  Baseline:  Goal status: INITIAL    LONG TERM GOALS: Target date:  06/18/22    Pt to be I with advanced HEP.  Baseline:  Goal status: INITIAL  2.  Pt will have 50% less urgency due to bladder retraining and strengthening  Baseline:  Goal status: INITIAL  3.  Pt to demonstrate at least 4/5 pelvic floor strength with ability to hold contraction for 8s for improved pelvic stability and decreased strain at pelvic floor/ decrease leakage.  Baseline:  Goal status: INITIAL  4.  Pt to demonstrate at least 5/5 bil hip strength for improved pelvic stability and functional squats without leakage.  Baseline:  Goal status: INITIAL  5.  Pt to demonstrate improved coordination of pelvic floor and breathing mechanics for squatting at least 20# without prolapse symptoms for improved pelvic stability and decreased stress at pelvic floor.  Baseline:  Goal status: INITIAL    PLAN: PT FREQUENCY: 1x/week  PT DURATION:  8 sessions  PLANNED INTERVENTIONS: Therapeutic exercises, Therapeutic activity, Neuromuscular re-education, Patient/Family education, Joint mobilization, Aquatic Therapy, Dry Needling, Spinal mobilization, Cryotherapy, Moist heat, scar mobilization, Taping, Biofeedback, and Manual therapy  PLAN FOR NEXT SESSION: coordination of pelvic floor and breathing mechanics   Stacy Gardner, PT, DPT 08/16/238:52 AM

## 2022-04-30 ENCOUNTER — Ambulatory Visit: Payer: BC Managed Care – PPO | Admitting: Physical Therapy

## 2022-04-30 DIAGNOSIS — R279 Unspecified lack of coordination: Secondary | ICD-10-CM

## 2022-04-30 DIAGNOSIS — M6281 Muscle weakness (generalized): Secondary | ICD-10-CM

## 2022-04-30 DIAGNOSIS — R293 Abnormal posture: Secondary | ICD-10-CM

## 2022-04-30 NOTE — Therapy (Signed)
OUTPATIENT PHYSICAL THERAPY FEMALE PELVIC TREATMENT   Patient Name: Brittany Barton MRN: [id number] DOB:31-Jul-1961, 61 y.o., female Today's Date: 04/30/2022   PT End of Session - 04/30/22 0847     Visit Number 6    Date for PT Re-Evaluation 06/18/22    Authorization Type BCBS    PT Start Time 0845    PT Stop Time 0926    PT Time Calculation (min) 41 min    Activity Tolerance Patient tolerated treatment well    Behavior During Therapy [facility] for tasks assessed/performed              Past Medical History:  Diagnosis Date   Anemia    Chicken pox    COVID    may 2022 cold s/s cough . blood sugars out of control.   Depression    Resolved. in 92s- meds and counseling.   Diabetes mellitus ([place])    Family history of hemochromatosis    father- screen iron   Gestational diabetes 09/08/1985   a1c q3 years   Hammer toe    Lichen planus    Sees [facility] dermatology - Dr. Particia Nearing   Morton's neuroma    per records,pt.denies 10/15/21   Wears glasses    Past Surgical History:  Procedure Laterality Date   DILATATION & CURETTAGE/HYSTEROSCOPY WITH MYOSURE N/A 03/03/2022   Procedure: hysterscopic myomectomy dilation and curretage;  Surgeon: Nunzio Cobbs, MD;  Location: [facility];  Service: Gynecology;  Laterality: N/A;  hysterscopic myomectomy dilation and curretage   ENDOMETRIAL ABLATION  2010   heavy menstrual bleeding   TONSILLECTOMY AND ADENOIDECTOMY  1965   Patient Active Problem List   Diagnosis Date Noted   COVID-19 virus infection 01/25/2021   Hyponatremia 10/16/2020   LADA (latent autoimmune diabetes of adulthood) ([place]) 11/23/2018   Fever blister 08/04/2016   Hyperlipidemia 08/04/2016   Depression    Family history of hemochromatosis     PCP: Marin Olp, MD  REFERRING PROVIDER: Nunzio Cobbs, MD  REFERRING DIAG: N81.2 (ICD-10-CM) - Incomplete uterovaginal prolapse R39.15 (ICD-10-CM) - Urinary urgency  THERAPY  DIAG:  Muscle weakness (generalized)  Unspecified lack of coordination  Abnormal posture  Rationale for Evaluation and Treatment Rehabilitation  ONSET DATE: prolapse started 3 years ago  SUBJECTIVE:                                                                                                                                                                                           SUBJECTIVE STATEMENT: Pt reports she still feels same prolapse but reports she feels stronger at pelvis and  core. Urgency is getting better as well and not feeling it as much now.    Fluid intake: Yes: 8 glasses of water per day, coffee in morning 1 cup, sometimes one cup of green tea     PAIN:  Are you having pain? No   PLOF: Independent  PATIENT GOALS to have less prolapse symptoms.   PERTINENT HISTORY:  endometrial ablation with postmenopausal bleeding, endometrial mass 2.5 x 1.3 cm, hysteroscopy with dilation and curettage and Myosurgical resection of endometrial mass 03/03/22 Sexual abuse: No  BOWEL MOVEMENT Pain with bowel movement: No Type of bowel movement:Type (Bristol Stool Scale) 4, Frequency daily, and Strain No Fully empty rectum: Yes:   Leakage: No Pads: No Fiber supplement: No  URINATION Pain with urination: No Fully empty bladder: Yes:   Stream: Strong and Weak Urgency: Yes:   Frequency: nighttime 1-4x; day time not quicker than every 2 hours Leakage:  not getting to toilet quick enough but that is rare Pads: No  INTERCOURSE Pain with intercourse: Initial Penetration and During Penetration Ability to have vaginal penetration:  Yes:   Climax: not painful Marinoff Scale: 0/3 Does report dryness   PREGNANCY Vaginal deliveries 2 Tearing Yes: episiotomy with first; doesn't think she had tearing with second C-section deliveries 0 Currently pregnant No  PROLAPSE Cystocele  , Urethrocele  , and Rectocele    per pt    OBJECTIVE:   DIAGNOSTIC FINDINGS:     COGNITION:  Overall cognitive status: Within functional limits for tasks assessed     SENSATION:  Light touch: Appears intact  Proprioception: Appears intact  MUSCLE LENGTH: Bil adductors and hamstrings limited by 25%                POSTURE: No Significant postural limitations   LUMBARAROM/PROM  A/PROM A/PROM  eval  Flexion WFL  Extension WFL  Right lateral flexion Limited by 25%  Left lateral flexion Limited by 25%  Right rotation WFL  Left rotation WFL   (Blank rows = not tested)  LOWER EXTREMITY ROM:  WFL  LOWER EXTREMITY MMT:  Bil hip abduction 3+5, flexion and extension 4/5; knees and ankles 5/5   PALPATION:   General  no TTP                External Perineal Exam WFL, dryness noted at vulva                             Internal Pelvic Floor no TTP  Patient confirms identification and approves PT to assess internal pelvic floor and treatment Yes  PELVIC MMT:   MMT eval  Vaginal 3/5 improved to 4/5 with exhale coordinated with contraction; 8s isometrics, 5 reps  Internal Anal Sphincter   External Anal Sphincter   Puborectalis   Diastasis Recti   (Blank rows = not tested)        TONE: Mildly decreased  PROLAPSE: Anterior wall laxity noted in hooklying with strong cough x5  possible grade 2 based on descent of tissue; posterior wall laxity noted as well possible grade 1  TODAY'S TREATMENT   04/30/2022: NMRE: all exercises cued for coordination of pelvic floor and breathing as well as core activation for decreased strain at prolapse and pelvic floor  Bridges with march x10 each Bridges with blue band bil shoulder horizontal abduction x10 Bear planks 2x10 Dead bugs x10 blue band  Blue loop fire hydrant and donkey kick x10 each Squats 15#  x10 with mini jump after squat X10 lunges with tricep extension same side blue band  X10 stationary lunges with opp trunk rotation blue band   04/23/2022: NMRE: all exercises cued for coordination of pelvic  floor and breathing as well as core activation for decreased strain at prolapse and pelvic floor  Pt educated on urge drill Bridges 2x10 with [place]ette squeezes Opp hand/knee [place] press 2x10 Blue band dead bugs 2x10 Bear crawl x20 Squats 15# 2x10 Dead lifts 2x10 15# Bosu: one leg on ground one on [place] x10 squats Palloffs blue band x20  Rotational palloffs blue band x20 X10 cross body standing crunch blue band    PATIENT EDUCATION:  Education details: [id number] Person educated: Patient Education method: Consulting civil engineer, Media planner, Corporate treasurer cues, Verbal cues, and Handouts Education comprehension: verbalized understanding and returned demonstration   HOME EXERCISE PROGRAM: [id number]  ASSESSMENT:  CLINICAL IMPRESSION: Patient session focused on hip and core and strengthening with coordination of pelvic floor and breathing mechanics throughout. Pt benefited from cues with all exercises for technique and coordination of breathing. Increased challenge today with increased resistance and standing exercises and added mini jumps today without any symptoms. Pt tolerated well and denied all symptoms of prolapse during session. Pt has shown improvement with core activation and strengthening and improved stability with cues during session. Pt continues to need skilled intervention for correction and activation of core/pelvic floor.  Pt would benefit from additional PT to further address deficits.     OBJECTIVE IMPAIRMENTS decreased coordination, decreased endurance, decreased strength, impaired flexibility, improper body mechanics, and postural dysfunction.   ACTIVITY LIMITATIONS carrying, lifting, squatting, and continence  PARTICIPATION LIMITATIONS: interpersonal relationship and community activity  PERSONAL FACTORS Fitness, Time since onset of injury/illness/exacerbation, and 1 comorbidity: medical history  are also affecting patient's functional outcome.   REHAB POTENTIAL: Good  CLINICAL  DECISION MAKING: Stable/uncomplicated  EVALUATION COMPLEXITY: Low   GOALS: Goals reviewed with patient? Yes  SHORT TERM GOALS: Target date: 04/15/2022  Pt to be I with HEP.  Baseline: Goal status: MET  2.  Pt will have 25% less urgency due to bladder retraining and strengthening  Baseline:  Goal status: MET  3.  Pt to demonstrate at least 4/5 pelvic floor strength for improved pelvic stability and decreased strain at pelvic floor.  Baseline:  Goal status: ON GOING  4.  Pt to be I with voiding and breathing mechanics to decrease strain at prolapse and pelvic floor  Baseline:  Goal status: MET    LONG TERM GOALS: Target date:  06/18/22    Pt to be I with advanced HEP.  Baseline:  Goal status: INITIAL  2.  Pt will have 50% less urgency due to bladder retraining and strengthening  Baseline:  Goal status: INITIAL  3.  Pt to demonstrate at least 4/5 pelvic floor strength with ability to hold contraction for 8s for improved pelvic stability and decreased strain at pelvic floor/ decrease leakage.  Baseline:  Goal status: INITIAL  4.  Pt to demonstrate at least 5/5 bil hip strength for improved pelvic stability and functional squats without leakage.  Baseline:  Goal status: INITIAL  5.  Pt to demonstrate improved coordination of pelvic floor and breathing mechanics for squatting at least 20# without prolapse symptoms for improved pelvic stability and decreased stress at pelvic floor.  Baseline:  Goal status: INITIAL    PLAN: PT FREQUENCY: 1x/week  PT DURATION:  8 sessions  PLANNED INTERVENTIONS: Therapeutic exercises, Therapeutic activity, Neuromuscular re-education, Patient/Family education, Joint  mobilization, Aquatic Therapy, Dry Needling, Spinal mobilization, Cryotherapy, Moist heat, scar mobilization, Taping, Biofeedback, and Manual therapy  PLAN FOR NEXT SESSION: coordination of pelvic floor and breathing mechanics   Stacy Gardner, PT, DPT 08/23/239:36 AM

## 2022-05-07 ENCOUNTER — Ambulatory Visit: Payer: BC Managed Care – PPO | Admitting: Physical Therapy

## 2022-05-07 DIAGNOSIS — M6281 Muscle weakness (generalized): Secondary | ICD-10-CM

## 2022-05-07 DIAGNOSIS — R293 Abnormal posture: Secondary | ICD-10-CM

## 2022-05-07 DIAGNOSIS — R279 Unspecified lack of coordination: Secondary | ICD-10-CM

## 2022-05-07 NOTE — Therapy (Signed)
OUTPATIENT PHYSICAL THERAPY FEMALE PELVIC TREATMENT   Patient Name: Brittany Barton MRN: [id number] DOB:07-Sep-1961, 61 y.o., female Today's Date: 05/07/2022   PT End of Session - 05/07/22 0800     Visit Number 7    Date for PT Re-Evaluation 06/18/22    Authorization Type BCBS    PT Start Time 0800    PT Stop Time 0842    PT Time Calculation (min) 42 min    Activity Tolerance Patient tolerated treatment well    Behavior During Therapy [facility] for tasks assessed/performed              Past Medical History:  Diagnosis Date   Anemia    Chicken pox    COVID    may 2022 cold s/s cough . blood sugars out of control.   Depression    Resolved. in 62s- meds and counseling.   Diabetes mellitus ([place])    Family history of hemochromatosis    father- screen iron   Gestational diabetes 09/08/1985   a1c q3 years   Hammer toe    Lichen planus    Sees [facility] dermatology - Dr. Particia Nearing   Morton's neuroma    per records,pt.denies 10/15/21   Wears glasses    Past Surgical History:  Procedure Laterality Date   DILATATION & CURETTAGE/HYSTEROSCOPY WITH MYOSURE N/A 03/03/2022   Procedure: hysterscopic myomectomy dilation and curretage;  Surgeon: Nunzio Cobbs, MD;  Location: [facility];  Service: Gynecology;  Laterality: N/A;  hysterscopic myomectomy dilation and curretage   ENDOMETRIAL ABLATION  2010   heavy menstrual bleeding   TONSILLECTOMY AND ADENOIDECTOMY  1965   Patient Active Problem List   Diagnosis Date Noted   COVID-19 virus infection 01/25/2021   Hyponatremia 10/16/2020   LADA (latent autoimmune diabetes of adulthood) ([place]) 11/23/2018   Fever blister 08/04/2016   Hyperlipidemia 08/04/2016   Depression    Family history of hemochromatosis     PCP: Marin Olp, MD  REFERRING PROVIDER: Nunzio Cobbs, MD  REFERRING DIAG: N81.2 (ICD-10-CM) - Incomplete uterovaginal prolapse R39.15 (ICD-10-CM) - Urinary urgency  THERAPY  DIAG:  Muscle weakness (generalized)  Unspecified lack of coordination  Abnormal posture  Rationale for Evaluation and Treatment Rehabilitation  ONSET DATE: prolapse started 3 years ago  SUBJECTIVE:                                                                                                                                                                                           SUBJECTIVE STATEMENT: Pt reports she plans to ask about pessary resizing as she does have symptoms with  pessary in place and curious if she needs a new size. Pt reports she thinks the breathing techniques and pelvic floor contractions with activity does help but feels like strength is not fully there yet to hold everything throughout he full task. Most symptoms are worse in standing. Pt reports she does get up 2-4 times per night however this various based on how much she drinks before bed. Pt reports she has still not had any urgency, filling like she is able to fully empty bladder and have larger voids, and no longer having pain with sex.    Fluid intake: Yes: 8 glasses of water per day, coffee in morning 1 cup, sometimes one cup of green tea     PAIN:  Are you having pain? No   PLOF: Independent  PATIENT GOALS to have less prolapse symptoms.   PERTINENT HISTORY:  endometrial ablation with postmenopausal bleeding, endometrial mass 2.5 x 1.3 cm, hysteroscopy with dilation and curettage and Myosurgical resection of endometrial mass 03/03/22 Sexual abuse: No  BOWEL MOVEMENT Pain with bowel movement: No Type of bowel movement:Type (Bristol Stool Scale) 4, Frequency daily, and Strain No Fully empty rectum: Yes:   Leakage: No Pads: No Fiber supplement: No  URINATION Pain with urination: No Fully empty bladder: Yes:   Stream: Strong and Weak Urgency: Yes:   Frequency: nighttime 1-4x; day time not quicker than every 2 hours Leakage:  not getting to toilet quick enough but that is rare Pads:  No  INTERCOURSE Pain with intercourse: Initial Penetration and During Penetration Ability to have vaginal penetration:  Yes:   Climax: not painful Marinoff Scale: 0/3 Does report dryness   PREGNANCY Vaginal deliveries 2 Tearing Yes: episiotomy with first; doesn't think she had tearing with second C-section deliveries 0 Currently pregnant No  PROLAPSE Cystocele  , Urethrocele  , and Rectocele    per pt    OBJECTIVE:   DIAGNOSTIC FINDINGS:    COGNITION:  Overall cognitive status: Within functional limits for tasks assessed     SENSATION:  Light touch: Appears intact  Proprioception: Appears intact  MUSCLE LENGTH: Bil adductors and hamstrings limited by 25%                POSTURE: No Significant postural limitations   LUMBARAROM/PROM  A/PROM A/PROM  eval  Flexion WFL  Extension WFL  Right lateral flexion Limited by 25%  Left lateral flexion Limited by 25%  Right rotation WFL  Left rotation WFL   (Blank rows = not tested)  LOWER EXTREMITY ROM:  WFL  LOWER EXTREMITY MMT:  Bil hip abduction 3+5, flexion and extension 4/5; knees and ankles 5/5   PALPATION:   General  no TTP                External Perineal Exam WFL, dryness noted at vulva                             Internal Pelvic Floor no TTP  Patient confirms identification and approves PT to assess internal pelvic floor and treatment Yes  PELVIC MMT:   MMT eval  Vaginal 3/5 improved to 4/5 with exhale coordinated with contraction; 8s isometrics, 5 reps  Internal Anal Sphincter   External Anal Sphincter   Puborectalis   Diastasis Recti   (Blank rows = not tested)        TONE: Mildly decreased  PROLAPSE: Anterior wall laxity noted in hooklying with strong cough  x5  possible grade 2 based on descent of tissue; posterior wall laxity noted as well possible grade 1  TODAY'S TREATMENT   05/07/22: X10 palloffs 5# cables Bridges x10 with emphasis on TA activation to decrease strain at  upper abdominals  Wall modified planks 2x30s with TA activation emphasis 2x10 15# squats X10 single leg squats from mat table with black pad Pt reports she had pain at Lt upper abdominals at home with bridging up to get into relief positions, pt instructed to demonstrate how she was trying to do this, pt demonstrated straining and upper abdominal activation with minimal TA and with cues for breathing mechanics and TA activation pt reported no longer having pain.    04/30/2022: NMRE: all exercises cued for coordination of pelvic floor and breathing as well as core activation for decreased strain at prolapse and pelvic floor  Bridges with march x10 each Bridges with blue band bil shoulder horizontal abduction x10 Bear planks 2x10 Dead bugs x10 blue band  Blue loop fire hydrant and donkey kick x10 each Squats 15# x10 with mini jump after squat X10 lunges with tricep extension same side blue band  X10 stationary lunges with opp trunk rotation blue band      PATIENT EDUCATION:  Education details: [id number] Person educated: Patient Education method: Consulting civil engineer, Media planner, Corporate treasurer cues, Verbal cues, and Handouts Education comprehension: verbalized understanding and returned demonstration   HOME EXERCISE PROGRAM: [id number]  ASSESSMENT:  CLINICAL IMPRESSION: Patient session focused on hip and core and strengthening with coordination of pelvic floor and breathing mechanics throughout. Pt benefited from cues with all exercises for technique and coordination of breathing. Increased challenge today with increased resistance and standing exercises and single leg exercises today without any symptoms. Pt tolerated well and denied all symptoms of prolapse during session. Pt has shown improvement with core activation and strengthening and improved stability with cues during session. Pt continues to need skilled intervention for correction and activation of core/pelvic floor.  Pt would benefit from  additional PT to further address deficits.     OBJECTIVE IMPAIRMENTS decreased coordination, decreased endurance, decreased strength, impaired flexibility, improper body mechanics, and postural dysfunction.   ACTIVITY LIMITATIONS carrying, lifting, squatting, and continence  PARTICIPATION LIMITATIONS: interpersonal relationship and community activity  PERSONAL FACTORS Fitness, Time since onset of injury/illness/exacerbation, and 1 comorbidity: medical history  are also affecting patient's functional outcome.   REHAB POTENTIAL: Good  CLINICAL DECISION MAKING: Stable/uncomplicated  EVALUATION COMPLEXITY: Low   GOALS: Goals reviewed with patient? Yes  SHORT TERM GOALS: Target date: 04/15/2022  Pt to be I with HEP.  Baseline: Goal status: MET  2.  Pt will have 25% less urgency due to bladder retraining and strengthening  Baseline:  Goal status: MET  3.  Pt to demonstrate at least 4/5 pelvic floor strength for improved pelvic stability and decreased strain at pelvic floor.  Baseline:  Goal status: ON GOING  4.  Pt to be I with voiding and breathing mechanics to decrease strain at prolapse and pelvic floor  Baseline:  Goal status: MET    LONG TERM GOALS: Target date:  06/18/22    Pt to be I with advanced HEP.  Baseline:  Goal status: INITIAL  2.  Pt will have 50% less urgency due to bladder retraining and strengthening  Baseline:  Goal status: INITIAL  3.  Pt to demonstrate at least 4/5 pelvic floor strength with ability to hold contraction for 8s for improved pelvic stability and decreased strain at pelvic  floor/ decrease leakage.  Baseline:  Goal status: INITIAL  4.  Pt to demonstrate at least 5/5 bil hip strength for improved pelvic stability and functional squats without leakage.  Baseline:  Goal status: INITIAL  5.  Pt to demonstrate improved coordination of pelvic floor and breathing mechanics for squatting at least 20# without prolapse symptoms for improved  pelvic stability and decreased stress at pelvic floor.  Baseline:  Goal status: INITIAL    PLAN: PT FREQUENCY: 1x/week  PT DURATION:  8 sessions  PLANNED INTERVENTIONS: Therapeutic exercises, Therapeutic activity, Neuromuscular re-education, Patient/Family education, Joint mobilization, Aquatic Therapy, Dry Needling, Spinal mobilization, Cryotherapy, Moist heat, scar mobilization, Taping, Biofeedback, and Manual therapy  PLAN FOR NEXT SESSION: coordination of pelvic floor and breathing mechanics   Stacy Gardner, PT, DPT 08/30/238:45 AM

## 2022-05-14 ENCOUNTER — Ambulatory Visit: Payer: BC Managed Care – PPO | Attending: Obstetrics and Gynecology | Admitting: Physical Therapy

## 2022-05-14 DIAGNOSIS — M6281 Muscle weakness (generalized): Secondary | ICD-10-CM | POA: Insufficient documentation

## 2022-05-14 DIAGNOSIS — R293 Abnormal posture: Secondary | ICD-10-CM | POA: Diagnosis present

## 2022-05-14 DIAGNOSIS — R279 Unspecified lack of coordination: Secondary | ICD-10-CM | POA: Diagnosis present

## 2022-05-14 NOTE — Therapy (Signed)
OUTPATIENT PHYSICAL THERAPY FEMALE PELVIC TREATMENT   Patient Name: Brittany Barton MRN: [id number] DOB:[birth year], 61 y.o., female Today's Date: 05/14/2022   PT End of Session - 05/14/22 0754     Visit Number 8    Date for PT Re-Evaluation 06/18/22    Authorization Type BCBS    PT Start Time 0800    PT Stop Time 0839    PT Time Calculation (min) 39 min    Activity Tolerance Patient tolerated treatment well    Behavior During Therapy [facility] for tasks assessed/performed              Past Medical History:  Diagnosis Date   Anemia    Chicken pox    COVID    may 2022 cold s/s cough . blood sugars out of control.   Depression    Resolved. in 29s- meds and counseling.   Diabetes mellitus ([place])    Family history of hemochromatosis    father- screen iron   Gestational diabetes 09/08/1985   a1c q3 years   Hammer toe    Lichen planus    Sees [facility] dermatology - Dr. Particia Nearing   Morton's neuroma    per records,pt.denies 10/15/21   Wears glasses    Past Surgical History:  Procedure Laterality Date   DILATATION & CURETTAGE/HYSTEROSCOPY WITH MYOSURE N/A 03/03/2022   Procedure: hysterscopic myomectomy dilation and curretage;  Surgeon: Nunzio Cobbs, MD;  Location: [facility];  Service: Gynecology;  Laterality: N/A;  hysterscopic myomectomy dilation and curretage   ENDOMETRIAL ABLATION  2010   heavy menstrual bleeding   TONSILLECTOMY AND ADENOIDECTOMY  1965   Patient Active Problem List   Diagnosis Date Noted   COVID-19 virus infection 01/25/2021   Hyponatremia 10/16/2020   LADA (latent autoimmune diabetes of adulthood) ([place]) 11/23/2018   Fever blister 08/04/2016   Hyperlipidemia 08/04/2016   Depression    Family history of hemochromatosis     PCP: Marin Olp, MD  REFERRING PROVIDER: Nunzio Cobbs, MD  REFERRING DIAG: N81.2 (ICD-10-CM) - Incomplete uterovaginal prolapse R39.15 (ICD-10-CM) - Urinary urgency  THERAPY  DIAG:  Muscle weakness (generalized)  Unspecified lack of coordination  Abnormal posture  Rationale for Evaluation and Treatment Rehabilitation  ONSET DATE: prolapse started 3 years ago  SUBJECTIVE:                                                                                                                                                                                           SUBJECTIVE STATEMENT: Pt has upcoming appt with GYN for refitting next week. Pt presents today without pessary  and reports she feels a little pressure/heaviness and if she were to try full day the longer without would be worse.    Fluid intake: Yes: 8 glasses of water per day, coffee in morning 1 cup, sometimes one cup of green tea     PAIN:  Are you having pain? No   PLOF: Independent  PATIENT GOALS to have less prolapse symptoms.   PERTINENT HISTORY:  endometrial ablation with postmenopausal bleeding, endometrial mass 2.5 x 1.3 cm, hysteroscopy with dilation and curettage and Myosurgical resection of endometrial mass 03/03/22 Sexual abuse: No  BOWEL MOVEMENT Pain with bowel movement: No Type of bowel movement:Type (Bristol Stool Scale) 4, Frequency daily, and Strain No Fully empty rectum: Yes:   Leakage: No Pads: No Fiber supplement: No  URINATION Pain with urination: No Fully empty bladder: Yes:   Stream: Strong and Weak Urgency: Yes:   Frequency: nighttime 1-4x; day time not quicker than every 2 hours Leakage:  not getting to toilet quick enough but that is rare Pads: No  INTERCOURSE Pain with intercourse: Initial Penetration and During Penetration Ability to have vaginal penetration:  Yes:   Climax: not painful Marinoff Scale: 0/3 Does report dryness   PREGNANCY Vaginal deliveries 2 Tearing Yes: episiotomy with first; doesn't think she had tearing with second C-section deliveries 0 Currently pregnant No  PROLAPSE Cystocele  , Urethrocele  , and Rectocele    per  pt    OBJECTIVE:   DIAGNOSTIC FINDINGS:    COGNITION:  Overall cognitive status: Within functional limits for tasks assessed     SENSATION:  Light touch: Appears intact  Proprioception: Appears intact  MUSCLE LENGTH: Bil adductors and hamstrings limited by 25%                POSTURE: No Significant postural limitations   LUMBARAROM/PROM  A/PROM A/PROM  eval  Flexion WFL  Extension WFL  Right lateral flexion Limited by 25%  Left lateral flexion Limited by 25%  Right rotation WFL  Left rotation WFL   (Blank rows = not tested)  LOWER EXTREMITY ROM:  WFL  LOWER EXTREMITY MMT:  Bil hip abduction 3+5, flexion and extension 4/5; knees and ankles 5/5   PALPATION:   General  no TTP                External Perineal Exam WFL, dryness noted at vulva                             Internal Pelvic Floor no TTP  Patient confirms identification and approves PT to assess internal pelvic floor and treatment Yes  PELVIC MMT:   MMT eval 05/14/22   Vaginal 3/5 improved to 4/5 with exhale coordinated with contraction; 8s isometrics, 5 reps 4/5 10s 10 reps Pt did have 5/5 holds for 3-4s and 5reps  Internal Anal Sphincter    External Anal Sphincter    Puborectalis    Diastasis Recti    (Blank rows = not tested)        TONE: Mildly decreased  PROLAPSE: Anterior wall laxity noted in hooklying with strong cough x5  possible grade 2 based on descent of tissue; posterior wall laxity noted as well possible grade 1  05/14/22: grade 1 with contraction with cough of anterior wall laxity lesser grade 2 without contraction of anterior wall; did not see laxity of posterior wall today   Pt has not had pessary in since last night;  tested in hooklying   TODAY'S TREATMENT   05/14/22:  Pt consented to internal vaginal re-testing today and treatment 2x10 pelvic floor contractions, 2x10 10s isometrics, 8H82 quick flicks Findings above for detail but pt improved in all categories and  prolapse improved in hooklying as well  Squats 2x10 body weight (without pessary) 2x10 blue band palloffs (without pessary)  05/07/22: X10 palloffs 5# cables Bridges x10 with emphasis on TA activation to decrease strain at upper abdominals  Wall modified planks 2x30s with TA activation emphasis 2x10 15# squats X10 single leg squats from mat table with black pad Pt reports she had pain at Lt upper abdominals at home with bridging up to get into relief positions, pt instructed to demonstrate how she was trying to do this, pt demonstrated straining and upper abdominal activation with minimal TA and with cues for breathing mechanics and TA activation pt reported no longer having pain.     PATIENT EDUCATION:  Education details: [id number] Person educated: Patient Education method: Education officer, environmental, Corporate treasurer cues, Verbal cues, and Handouts Education comprehension: verbalized understanding and returned demonstration   HOME EXERCISE PROGRAM: [id number]  ASSESSMENT:  CLINICAL IMPRESSION: Patient session focused on reassessing strength of pelvic floor as pt has upcoming appt with GYN for pessary refit and wanted to know updates of her gains since starting PT to tell the doctor. Pt found to have increased strength, endurance, and coordination compared to evaluation and prolapse grading appearing to have improved with testing as well. Pt was assessed in hooklying and not standing today and educated that prolapse may test a little worse in standing but pt verbalized understanding and pt was tested in hooklying at evaluation as well so compared in same positions. Pt demonstrated improved prolapse to even better grade with implementation of pelvic floor contraction with cough which is also a great improvement. Pt also did not have pessary in place since last night (longer time without in place as compared to evaluation as well). Pt agreed to attempt gentle exercises without pessary at end of session  to monitor symptoms and denied worsening of any prolapse symptoms with body weight squats and palloffs. Pt continues to need skilled intervention for correction and activation of core/pelvic floor.  Pt would benefit from additional PT to further address deficits.     OBJECTIVE IMPAIRMENTS decreased coordination, decreased endurance, decreased strength, impaired flexibility, improper body mechanics, and postural dysfunction.   ACTIVITY LIMITATIONS carrying, lifting, squatting, and continence  PARTICIPATION LIMITATIONS: interpersonal relationship and community activity  PERSONAL FACTORS Fitness, Time since onset of injury/illness/exacerbation, and 1 comorbidity: medical history  are also affecting patient's functional outcome.   REHAB POTENTIAL: Good  CLINICAL DECISION MAKING: Stable/uncomplicated  EVALUATION COMPLEXITY: Low   GOALS: Goals reviewed with patient? Yes  SHORT TERM GOALS: Target date: 04/15/2022  Pt to be I with HEP.  Baseline: Goal status: MET  2.  Pt will have 25% less urgency due to bladder retraining and strengthening  Baseline:  Goal status: MET  3.  Pt to demonstrate at least 4/5 pelvic floor strength for improved pelvic stability and decreased strain at pelvic floor.  Baseline:  Goal status: MET  4.  Pt to be I with voiding and breathing mechanics to decrease strain at prolapse and pelvic floor  Baseline:  Goal status: MET    LONG TERM GOALS: Target date:  06/18/22    Pt to be I with advanced HEP.  Baseline:  Goal status: MET  2.  Pt will have 50% less urgency  due to bladder retraining and strengthening  Baseline:  Goal status: MET  3.  Pt to demonstrate at least 4/5 pelvic floor strength with ability to hold contraction for 8s for improved pelvic stability and decreased strain at pelvic floor/ decrease leakage.  Baseline:  Goal status: MET  4.  Pt to demonstrate at least 5/5 bil hip strength for improved pelvic stability and functional squats  without leakage.  Baseline:  Goal status: on going  5.  Pt to demonstrate improved coordination of pelvic floor and breathing mechanics for squatting at least 20# without prolapse symptoms for improved pelvic stability and decreased stress at pelvic floor.  Baseline:  Goal status: on going    PLAN: PT FREQUENCY: 1x/week  PT DURATION:  8 sessions  PLANNED INTERVENTIONS: Therapeutic exercises, Therapeutic activity, Neuromuscular re-education, Patient/Family education, Joint mobilization, Aquatic Therapy, Dry Needling, Spinal mobilization, Cryotherapy, Moist heat, scar mobilization, Taping, Biofeedback, and Manual therapy  PLAN FOR NEXT SESSION: coordination of pelvic floor and breathing mechanics   Stacy Gardner, PT, DPT 09/06/238:42 AM

## 2022-05-16 NOTE — Progress Notes (Unsigned)
GYNECOLOGY  VISIT   HPI: 61 y.o.   Married  Caucasian  female   G2P0 with Patient's last menstrual period was 09/09/2007 (approximate).   here for pessary problem.  Wants a pessary refitting. Feels the pessary move. Feels like something in hanging out.  Rarely feels like the pessary wants to come out with a BM.  Pessary is not painful.  Able to void well with the pessary in place.  No leakage of urine with the pessary in place.   Doing pelvic floor PT.   Has first degree uterine prolapse and first to second degree rectocele.  GYNECOLOGIC HISTORY: Patient's last menstrual period was 09/09/2007 (approximate). Contraception:  PMP/ablation Menopausal hormone therapy:  none Last mammogram:   10-08-21 Neg/Birads1 Last pap smear:   12-09-21 colpo revealed ECC Neg, cx bx LSIL. 12-09-21 ASCUS:Neg HR HPV, 08-19-18 Neg:Neg HR HPV, 08-05-17 Neg        OB History     Gravida  2   Para      Term      Preterm      AB      Living  2      SAB      IAB      Ectopic      Multiple      Live Births                 Patient Active Problem List   Diagnosis Date Noted   COVID-19 virus infection 01/25/2021   Hyponatremia 10/16/2020   LADA (latent autoimmune diabetes of adulthood) ([place]) 11/23/2018   Fever blister 08/04/2016   Hyperlipidemia 08/04/2016   Depression    Family history of hemochromatosis     Past Medical History:  Diagnosis Date   Anemia    Chicken pox    COVID    may 2022 cold s/s cough . blood sugars out of control.   Depression    Resolved. in 72s- meds and counseling.   Diabetes mellitus ([place])    Family history of hemochromatosis    father- screen iron   Gestational diabetes 09/08/1985   a1c q3 years   Hammer toe    Lichen planus    Sees [facility] dermatology - Dr. Particia Nearing   Morton's neuroma    per records,pt.denies 10/15/21   Wears glasses     Past Surgical History:  Procedure Laterality Date   DILATATION & CURETTAGE/HYSTEROSCOPY WITH MYOSURE N/A  03/03/2022   Procedure: hysterscopic myomectomy dilation and curretage;  Surgeon: Nunzio Cobbs, MD;  Location: [facility];  Service: Gynecology;  Laterality: N/A;  hysterscopic myomectomy dilation and curretage   ENDOMETRIAL ABLATION  2010   heavy menstrual bleeding   TONSILLECTOMY AND ADENOIDECTOMY  1965    Current Outpatient Medications  Medication Sig Dispense Refill   Accu-Chek FastClix Lancets MISC Use as instructed 204 each 3   acyclovir ointment (ZOVIRAX) 5 % Apply 1 application topically every 3 (three) hours. (Patient taking differently: Apply 1 application  topically every 3 (three) hours. Not taking now    takes for fever blister) 30 g 1   BAQSIMI TWO PACK 3 MG/DOSE POWD Use as needed for hypoglycemia 1 each PRN   Blood Glucose Calibration (ACCU-CHEK GUIDE CONTROL) LIQD To calibrate Accu-Chek Guide meter 1 each 11   Cholecalciferol 50 MCG (2000 UT) CAPS Take 1 capsule by mouth daily.     Continuous Blood Gluc Sensor (DEXCOM G6 SENSOR) MISC INJECT 1 DEVICE INTO THE SKIN  CONTINUOUS FOR 10 DAYS. 9 each 3   Continuous Blood Gluc Transmit (DEXCOM G6 TRANSMITTER) MISC APPLY EVERY 3 (THREE) MONTHS. 1 each 3   glucose blood test strip Use as instructed to check blood sugar 4 times a day - Accuchek Guide 100 each 3   Insulin Aspart, w/Niacinamide, (FIASP PENFILL) 100 UNIT/ML SOCT Use up to 20 units a day as advised 15 mL 11   Insulin Glargine (BASAGLAR KWIKPEN) 100 UNIT/ML Inject 6 units in a.m. and 3 units at bedtime under skin, as advised (Patient taking differently: Inject 5 units in a.m. and 4 units at bedtime under skin, as advised) 30 mL 3   Insulin Pen Needle (BD PEN NEEDLE NANO 2ND GEN) 32G X 4 MM MISC USE 6-8 TIMES A DAY FOR INSULIN 500 each 3   NOVOPEN ECHO DEVI once.     tetrahydrozoline-zinc (VISINE-AC) 0.05-0.25 % ophthalmic solution Place 1 drop into both eyes 3 (three) times daily as needed.     triamcinolone cream (KENALOG) 0.1 % For autoimmune  rash, not taking right now     vitamin B-12 (CYANOCOBALAMIN) 250 MCG tablet Take 250 mcg by mouth daily. Takes it once a week     No current facility-administered medications for this visit.     ALLERGIES: Penicillins and Sulfa antibiotics  Family History  Problem Relation Age of Onset   Osteoporosis Mother    Depression Mother        dealing with some memory issues   Glaucoma Mother    Dementia Mother    Hemochromatosis Father    Alcoholism Father    Hearing loss Brother        otosclerosis   Hypertension Brother    Hyperlipidemia Brother    Diabetes Brother        ? PE as well- she will verify   Hyperlipidemia Brother    Stroke Other        grandparent   Hypertension Other        grandparent   Colon cancer Neg Hx    Colon polyps Neg Hx    Esophageal cancer Neg Hx    Rectal cancer Neg Hx    Stomach cancer Neg Hx     Social History   Socioeconomic History   Marital status: Married    Spouse name: Not on file   Number of children: Not on file   Years of education: Not on file   Highest education level: Not on file  Occupational History   Not on file  Tobacco Use   Smoking status: Never   Smokeless tobacco: Never  Vaping Use   Vaping Use: Never used  Substance and Sexual Activity   Alcohol use: Yes   Drug use: No   Sexual activity: Yes    Birth control/protection: None  Other Topics Concern   Not on file  Social History Narrative   Married. (husband [name] of Dr. Yong Channel). 2 children grown and married.   Son - 2 kids including oldest [place] [id number], son is 64 years old   Daughter- 5 year old granddaughter  youngest 2 in 2020 premature 2 months early (47 months old identical twin girls) 08/15/2019         Has had 93 moves in 71 years, most recently [place] 1.5 years, [place] 5 years prior, [place] prior      B.A. In nutrition. Masters in [employer].  Has worked as a Licensed conveyancer.      Social Determinants of Health  Financial  Resource Strain: Not on file  Food Insecurity: Not on file  Transportation Needs: Not on file  Physical Activity: Not on file  Stress: Not on file  Social Connections: Not on file  Intimate Partner Violence: Not on file    Review of Systems  All other systems reviewed and are negative.   PHYSICAL EXAMINATION:    BP 120/82 (BP Location: Left Arm, Patient Position: Sitting, Cuff Size: Normal)   Ht '5\' 1"'$  (1.549 m)   Wt 119 lb (54 kg)   LMP 09/09/2007 (Approximate) Comment: this was with her endometrial ablation  BMI 22.48 kg/m     General appearance: alert, cooperative and appears stated age   Pelvic: External genitalia:  no lesions              Urethra:  normal appearing urethra with no masses, tenderness or lesions              Bartholins and Skenes: normal                 Vagina: normal appearing vagina with normal color and discharge, no lesions              Cervix: no lesions                Bimanual Exam:  Uterus:  normal size, contour, position, consistency, mobility, non-tender              Adnexa: no mass, fullness, tenderness              Patient's #4 pessary ring with support removed and #5 pessary ring with support fitted.   Chaperone was present for exam:  Kimalexis, CMA  ASSESSMENT  Incomplete uterovaginal prolapse.  Pessary fitting.   PLAN  Fitted for #5 ring with support.   Able to have a BM, void, and walking and maintain the pessary comfortably.   Premier #5 rings support to patient.  Lot number - [id number], exp 07/08/30. Follow up for annual exam and prn.    An After Visit Summary was printed and given to the patient.  15 min  total time was spent for this patient encounter, including preparation, face-to-face counseling with the patient, coordination of care, and documentation of the encounter in addition to doing pessary fitting.

## 2022-05-19 ENCOUNTER — Encounter: Payer: Self-pay | Admitting: Obstetrics and Gynecology

## 2022-05-19 ENCOUNTER — Ambulatory Visit (INDEPENDENT_AMBULATORY_CARE_PROVIDER_SITE_OTHER): Payer: BC Managed Care – PPO | Admitting: Obstetrics and Gynecology

## 2022-05-19 VITALS — BP 120/82 | Ht 61.0 in | Wt 119.0 lb

## 2022-05-19 DIAGNOSIS — N812 Incomplete uterovaginal prolapse: Secondary | ICD-10-CM

## 2022-05-19 DIAGNOSIS — Z4689 Encounter for fitting and adjustment of other specified devices: Secondary | ICD-10-CM | POA: Diagnosis not present

## 2022-05-21 ENCOUNTER — Ambulatory Visit: Payer: BC Managed Care – PPO | Admitting: Physical Therapy

## 2022-05-21 DIAGNOSIS — R293 Abnormal posture: Secondary | ICD-10-CM

## 2022-05-21 DIAGNOSIS — M6281 Muscle weakness (generalized): Secondary | ICD-10-CM | POA: Diagnosis not present

## 2022-05-21 DIAGNOSIS — R279 Unspecified lack of coordination: Secondary | ICD-10-CM

## 2022-05-21 NOTE — Therapy (Signed)
OUTPATIENT PHYSICAL THERAPY FEMALE PELVIC TREATMENT   Patient Name: Brittany Barton MRN: [id number] DOB:1960-09-27, 61 y.o., female Today's Date: 05/21/2022   PT End of Session - 05/21/22 1402     Visit Number 9    Date for PT Re-Evaluation 06/18/22    Authorization Type BCBS    PT Start Time 1400    PT Stop Time 1444    PT Time Calculation (min) 44 min    Activity Tolerance Patient tolerated treatment well    Behavior During Therapy [facility] for tasks assessed/performed              Past Medical History:  Diagnosis Date   Anemia    Chicken pox    COVID    may 2022 cold s/s cough . blood sugars out of control.   Depression    Resolved. in 53s- meds and counseling.   Diabetes mellitus ([place])    Family history of hemochromatosis    father- screen iron   Gestational diabetes 09/08/1985   a1c q3 years   Hammer toe    Lichen planus    Sees [facility] dermatology - Dr. Particia Nearing   Morton's neuroma    per records,pt.denies 10/15/21   Wears glasses    Past Surgical History:  Procedure Laterality Date   DILATATION & CURETTAGE/HYSTEROSCOPY WITH MYOSURE N/A 03/03/2022   Procedure: hysterscopic myomectomy dilation and curretage;  Surgeon: Nunzio Cobbs, MD;  Location: [facility];  Service: Gynecology;  Laterality: N/A;  hysterscopic myomectomy dilation and curretage   ENDOMETRIAL ABLATION  2010   heavy menstrual bleeding   TONSILLECTOMY AND ADENOIDECTOMY  1965   Patient Active Problem List   Diagnosis Date Noted   COVID-19 virus infection 01/25/2021   Hyponatremia 10/16/2020   LADA (latent autoimmune diabetes of adulthood) ([place]) 11/23/2018   Fever blister 08/04/2016   Hyperlipidemia 08/04/2016   Depression    Family history of hemochromatosis     PCP: Marin Olp, MD  REFERRING PROVIDER: Nunzio Cobbs, MD  REFERRING DIAG: N81.2 (ICD-10-CM) - Incomplete uterovaginal prolapse R39.15 (ICD-10-CM) - Urinary urgency  THERAPY  DIAG:  Muscle weakness (generalized)  Unspecified lack of coordination  Abnormal posture  Rationale for Evaluation and Treatment Rehabilitation  ONSET DATE: prolapse started 3 years ago  SUBJECTIVE:                                                                                                                                                                                           SUBJECTIVE STATEMENT: Pt reports she met with doctor got a new pessary size to fit better. Pt  states she also got a Physiological scientist and this has helped a lot too.    Fluid intake: Yes: 8 glasses of water per day, coffee in morning 1 cup, sometimes one cup of green tea     PAIN:  Are you having pain? No   PLOF: Independent  PATIENT GOALS to have less prolapse symptoms.   PERTINENT HISTORY:  endometrial ablation with postmenopausal bleeding, endometrial mass 2.5 x 1.3 cm, hysteroscopy with dilation and curettage and Myosurgical resection of endometrial mass 03/03/22 Sexual abuse: No  BOWEL MOVEMENT Pain with bowel movement: No Type of bowel movement:Type (Bristol Stool Scale) 4, Frequency daily, and Strain No Fully empty rectum: Yes:   Leakage: No Pads: No Fiber supplement: No  URINATION Pain with urination: No Fully empty bladder: Yes:   Stream: Strong and Weak Urgency: Yes:   Frequency: nighttime 1-4x; day time not quicker than every 2 hours Leakage:  not getting to toilet quick enough but that is rare Pads: No  INTERCOURSE Pain with intercourse: Initial Penetration and During Penetration Ability to have vaginal penetration:  Yes:   Climax: not painful Marinoff Scale: 0/3 Does report dryness   PREGNANCY Vaginal deliveries 2 Tearing Yes: episiotomy with first; doesn't think she had tearing with second C-section deliveries 0 Currently pregnant No  PROLAPSE Cystocele  , Urethrocele  , and Rectocele    per pt    OBJECTIVE:   DIAGNOSTIC FINDINGS:    COGNITION:  Overall  cognitive status: Within functional limits for tasks assessed     SENSATION:  Light touch: Appears intact  Proprioception: Appears intact  MUSCLE LENGTH: Bil adductors and hamstrings limited by 25%                POSTURE: No Significant postural limitations   LUMBARAROM/PROM  A/PROM A/PROM  eval  Flexion WFL  Extension WFL  Right lateral flexion Limited by 25%  Left lateral flexion Limited by 25%  Right rotation WFL  Left rotation WFL   (Blank rows = not tested)  LOWER EXTREMITY ROM:  WFL  LOWER EXTREMITY MMT:  Bil hip abduction 3+5, flexion and extension 4/5; knees and ankles 5/5   PALPATION:   General  no TTP                External Perineal Exam WFL, dryness noted at vulva                             Internal Pelvic Floor no TTP  Patient confirms identification and approves PT to assess internal pelvic floor and treatment Yes  PELVIC MMT:   MMT eval 05/14/22   Vaginal 3/5 improved to 4/5 with exhale coordinated with contraction; 8s isometrics, 5 reps 4/5 10s 10 reps Pt did have 5/5 holds for 3-4s and 5reps  Internal Anal Sphincter    External Anal Sphincter    Puborectalis    Diastasis Recti    (Blank rows = not tested)        TONE: Mildly decreased  PROLAPSE: Anterior wall laxity noted in hooklying with strong cough x5  possible grade 2 based on descent of tissue; posterior wall laxity noted as well possible grade 1  05/14/22: grade 1 with contraction with cough of anterior wall laxity lesser grade 2 without contraction of anterior wall; did not see laxity of posterior wall today   Pt has not had pessary in since last night; tested in hooklying   TODAY'S TREATMENT  05/21/22: Bridges 2x10 with red yoga [place]   Dead bug blue band 2x10 Bird dog blue band 2x10 Lunges lead leg on bosu x10 each Both legs on bosu mini squats x10 Held lunge lead leg on bosu with x10 blue band rotation palloffs Mountain climbers in modified quad 2x10  05/14/22:   Pt consented to internal vaginal re-testing today and treatment 2x10 pelvic floor contractions, 2x10 10s isometrics, 1W25 quick flicks Findings above for detail but pt improved in all categories and prolapse improved in hooklying as well  Squats 2x10 body weight (without pessary) 2x10 blue band palloffs (without pessary)   PATIENT EDUCATION:  Education details: [id number] Person educated: Patient Education method: Consulting civil engineer, Media planner, Corporate treasurer cues, Verbal cues, and Handouts Education comprehension: verbalized understanding and returned demonstration   HOME EXERCISE PROGRAM: [id number]  ASSESSMENT:  CLINICAL IMPRESSION: Patient session focused on hip and core strengthening with coordination of pelvic floor and breathing fo all exercises. Pt tolerated well, had increased challenge of exercises with more resistance and more stability exercises for improved pelvic stability and pelvic floor strength to support prolapse. Pt continues to need skilled intervention for correction and activation of core/pelvic floor.  Pt would benefit from additional PT to further address deficits.     OBJECTIVE IMPAIRMENTS decreased coordination, decreased endurance, decreased strength, impaired flexibility, improper body mechanics, and postural dysfunction.   ACTIVITY LIMITATIONS carrying, lifting, squatting, and continence  PARTICIPATION LIMITATIONS: interpersonal relationship and community activity  PERSONAL FACTORS Fitness, Time since onset of injury/illness/exacerbation, and 1 comorbidity: medical history  are also affecting patient's functional outcome.   REHAB POTENTIAL: Good  CLINICAL DECISION MAKING: Stable/uncomplicated  EVALUATION COMPLEXITY: Low   GOALS: Goals reviewed with patient? Yes  SHORT TERM GOALS: Target date: 04/15/2022  Pt to be I with HEP.  Baseline: Goal status: MET  2.  Pt will have 25% less urgency due to bladder retraining and strengthening  Baseline:  Goal status:  MET  3.  Pt to demonstrate at least 4/5 pelvic floor strength for improved pelvic stability and decreased strain at pelvic floor.  Baseline:  Goal status: MET  4.  Pt to be I with voiding and breathing mechanics to decrease strain at prolapse and pelvic floor  Baseline:  Goal status: MET    LONG TERM GOALS: Target date:  06/18/22    Pt to be I with advanced HEP.  Baseline:  Goal status: MET  2.  Pt will have 50% less urgency due to bladder retraining and strengthening  Baseline:  Goal status: MET  3.  Pt to demonstrate at least 4/5 pelvic floor strength with ability to hold contraction for 8s for improved pelvic stability and decreased strain at pelvic floor/ decrease leakage.  Baseline:  Goal status: MET  4.  Pt to demonstrate at least 5/5 bil hip strength for improved pelvic stability and functional squats without leakage.  Baseline:  Goal status: on going  5.  Pt to demonstrate improved coordination of pelvic floor and breathing mechanics for squatting at least 20# without prolapse symptoms for improved pelvic stability and decreased stress at pelvic floor.  Baseline:  Goal status: on going    PLAN: PT FREQUENCY: 1x/week  PT DURATION:  8 sessions  PLANNED INTERVENTIONS: Therapeutic exercises, Therapeutic activity, Neuromuscular re-education, Patient/Family education, Joint mobilization, Aquatic Therapy, Dry Needling, Spinal mobilization, Cryotherapy, Moist heat, scar mobilization, Taping, Biofeedback, and Manual therapy  PLAN FOR NEXT SESSION: coordination of pelvic floor and breathing mechanics   Stacy Gardner, PT, DPT 09/13/232:45 PM

## 2022-05-21 NOTE — Patient Instructions (Signed)

## 2022-06-02 ENCOUNTER — Encounter: Payer: Self-pay | Admitting: *Deleted

## 2022-06-03 ENCOUNTER — Ambulatory Visit: Payer: BC Managed Care – PPO | Admitting: Physical Therapy

## 2022-06-03 DIAGNOSIS — M6281 Muscle weakness (generalized): Secondary | ICD-10-CM

## 2022-06-03 DIAGNOSIS — R293 Abnormal posture: Secondary | ICD-10-CM

## 2022-06-03 DIAGNOSIS — R279 Unspecified lack of coordination: Secondary | ICD-10-CM

## 2022-06-03 NOTE — Therapy (Signed)
OUTPATIENT PHYSICAL THERAPY FEMALE PELVIC TREATMENT   Patient Name: Brittany Barton MRN: [id number] DOB:01-Jan-1961, 61 y.o., female Today's Date: 06/03/2022   PT End of Session - 06/03/22 0846     Visit Number 10    Date for PT Re-Evaluation 09/02/22    Authorization Type BCBS    PT Start Time [phone]    PT Stop Time 0927    PT Time Calculation (min) 44 min    Activity Tolerance Patient tolerated treatment well    Behavior During Therapy [facility] for tasks assessed/performed              Past Medical History:  Diagnosis Date   Anemia    Chicken pox    COVID    may 2022 cold s/s cough . blood sugars out of control.   Depression    Resolved. in 26s- meds and counseling.   Diabetes mellitus ([place])    Family history of hemochromatosis    father- screen iron   Gestational diabetes 09/08/1985   a1c q3 years   Hammer toe    Lichen planus    Sees [facility] dermatology - Dr. Particia Nearing   Morton's neuroma    per records,pt.denies 10/15/21   Wears glasses    Past Surgical History:  Procedure Laterality Date   DILATATION & CURETTAGE/HYSTEROSCOPY WITH MYOSURE N/A 03/03/2022   Procedure: hysterscopic myomectomy dilation and curretage;  Surgeon: Nunzio Cobbs, MD;  Location: [facility];  Service: Gynecology;  Laterality: N/A;  hysterscopic myomectomy dilation and curretage   ENDOMETRIAL ABLATION  2010   heavy menstrual bleeding   TONSILLECTOMY AND ADENOIDECTOMY  1965   Patient Active Problem List   Diagnosis Date Noted   COVID-19 virus infection 01/25/2021   Hyponatremia 10/16/2020   LADA (latent autoimmune diabetes of adulthood) ([place]) 11/23/2018   Fever blister 08/04/2016   Hyperlipidemia 08/04/2016   Depression    Family history of hemochromatosis     PCP: Marin Olp, MD  REFERRING PROVIDER: Nunzio Cobbs, MD  REFERRING DIAG: N81.2 (ICD-10-CM) - Incomplete uterovaginal prolapse R39.15 (ICD-10-CM) - Urinary urgency  THERAPY  DIAG:  Muscle weakness (generalized)  Abnormal posture  Unspecified lack of coordination  Rationale for Evaluation and Treatment Rehabilitation  ONSET DATE: prolapse started 3 years ago  SUBJECTIVE:                                                                                                                                                                                           SUBJECTIVE STATEMENT: Pt reports she no longer is having urinary urgency, having no pain with intercourse at  all now, now only getting up once per night to urinate, prolapse symptoms are improving does still wear pessary but pt reports she is ok with this. Pt reports she would like to focus on strengthening as she would at a gym with incorporation of pelvic floor strengthening to feel better about returning to gym post DC.    Fluid intake: Yes: 8 glasses of water per day, coffee in morning 1 cup, sometimes one cup of green tea     PAIN:  Are you having pain? No   PLOF: Independent  PATIENT GOALS to have less prolapse symptoms.   PERTINENT HISTORY:  endometrial ablation with postmenopausal bleeding, endometrial mass 2.5 x 1.3 cm, hysteroscopy with dilation and curettage and Myosurgical resection of endometrial mass 03/03/22 Sexual abuse: No  BOWEL MOVEMENT Pain with bowel movement: No Type of bowel movement:Type (Bristol Stool Scale) 4, Frequency daily, and Strain No Fully empty rectum: Yes:   Leakage: No Pads: No Fiber supplement: No  URINATION Pain with urination: No Fully empty bladder: Yes:   Stream: Strong and Weak Urgency: Yes:   Frequency: nighttime 1-4x; day time not quicker than every 2 hours Leakage:  not getting to toilet quick enough but that is rare Pads: No  INTERCOURSE Pain with intercourse: Initial Penetration and During Penetration Ability to have vaginal penetration:  Yes:   Climax: not painful Marinoff Scale: 0/3 Does report dryness   PREGNANCY Vaginal deliveries  2 Tearing Yes: episiotomy with first; doesn't think she had tearing with second C-section deliveries 0 Currently pregnant No  PROLAPSE Cystocele  , Urethrocele  , and Rectocele    per pt    OBJECTIVE:   DIAGNOSTIC FINDINGS:    COGNITION:  Overall cognitive status: Within functional limits for tasks assessed     SENSATION:  Light touch: Appears intact  Proprioception: Appears intact  MUSCLE LENGTH: Bil adductors and hamstrings limited by 25%                POSTURE: No Significant postural limitations   LUMBARAROM/PROM  A/PROM A/PROM  eval  Flexion WFL  Extension WFL  Right lateral flexion Limited by 25%  Left lateral flexion Limited by 25%  Right rotation WFL  Left rotation WFL   (Blank rows = not tested)  LOWER EXTREMITY ROM:  WFL  LOWER EXTREMITY MMT:  Bil hip abduction 3+5, flexion and extension 4/5; knees and ankles 5/5   PALPATION:   General  no TTP                External Perineal Exam WFL, dryness noted at vulva                             Internal Pelvic Floor no TTP  Patient confirms identification and approves PT to assess internal pelvic floor and treatment Yes  PELVIC MMT:   MMT eval 05/14/22   Vaginal 3/5 improved to 4/5 with exhale coordinated with contraction; 8s isometrics, 5 reps 4/5 10s 10 reps Pt did have 5/5 holds for 3-4s and 5reps  Internal Anal Sphincter    External Anal Sphincter    Puborectalis    Diastasis Recti    (Blank rows = not tested)        TONE: Mildly decreased  PROLAPSE: Anterior wall laxity noted in hooklying with strong cough x5  possible grade 2 based on descent of tissue; posterior wall laxity noted as well possible grade 1  05/14/22: grade  1 with contraction with cough of anterior wall laxity lesser grade 2 without contraction of anterior wall; did not see laxity of posterior wall today   Pt has not had pessary in since last night; tested in hooklying   TODAY'S TREATMENT  06/03/2022:  Constance Haw with  marching 3x10 each Leg press 70# x10 Lat pull down 40# x10 OHP 6# each x10 Bicep curls 6# x10 each Punches 3# x10 each  05/21/22: Bridges 2x10 with red yoga [place]   Dead bug blue band 2x10 Bird dog blue band 2x10 Lunges lead leg on bosu x10 each Both legs on bosu mini squats x10 Held lunge lead leg on bosu with x10 blue band rotation palloffs Mountain climbers in modified quad 2x10    PATIENT EDUCATION:  Education details: [id number] Person educated: Patient Education method: Consulting civil engineer, Media planner, Corporate treasurer cues, Verbal cues, and Handouts Education comprehension: verbalized understanding and returned demonstration   HOME EXERCISE PROGRAM: [id number]  ASSESSMENT:  CLINICAL IMPRESSION: Patient session focused on strengthening exercises with emphasis being on gym based exercises and machines for improved pt's confidence and and good technique for returning to gym post DC. Pt continues to need skilled intervention for correction and activation of core/pelvic floor.    OBJECTIVE IMPAIRMENTS decreased coordination, decreased endurance, decreased strength, impaired flexibility, improper body mechanics, and postural dysfunction.   ACTIVITY LIMITATIONS carrying, lifting, squatting, and continence  PARTICIPATION LIMITATIONS: interpersonal relationship and community activity  PERSONAL FACTORS Fitness, Time since onset of injury/illness/exacerbation, and 1 comorbidity: medical history  are also affecting patient's functional outcome.   REHAB POTENTIAL: Good  CLINICAL DECISION MAKING: Stable/uncomplicated  EVALUATION COMPLEXITY: Low   GOALS: Goals reviewed with patient? Yes  SHORT TERM GOALS: Target date: 04/15/2022  Pt to be I with HEP.  Baseline: Goal status: MET  2.  Pt will have 25% less urgency due to bladder retraining and strengthening  Baseline:  Goal status: MET  3.  Pt to demonstrate at least 4/5 pelvic floor strength for improved pelvic stability and decreased  strain at pelvic floor.  Baseline:  Goal status: MET  4.  Pt to be I with voiding and breathing mechanics to decrease strain at prolapse and pelvic floor  Baseline:  Goal status: MET    LONG TERM GOALS: Target date:  06/18/22    Pt to be I with advanced HEP.  Baseline:  Goal status: MET  2.  Pt will have 50% less urgency due to bladder retraining and strengthening  Baseline:  Goal status: MET  3.  Pt to demonstrate at least 4/5 pelvic floor strength with ability to hold contraction for 8s for improved pelvic stability and decreased strain at pelvic floor/ decrease leakage.  Baseline:  Goal status: MET  4.  Pt to demonstrate at least 5/5 bil hip strength for improved pelvic stability and functional squats without leakage.  Baseline:  Goal status: on going  5.  Pt to demonstrate improved coordination of pelvic floor and breathing mechanics for squatting at least 20# without prolapse symptoms for improved pelvic stability and decreased stress at pelvic floor.  Baseline:  Goal status: on going  6.  Pt to be I and confident with setup and good technique for leg press for 90# without cues for improved safety at gym post discharge with core and pelvic floor activation.  Baseline:  Goal status: NEW    PLAN: PT FREQUENCY: 1x/week - every other week  PT DURATION: other: 5 more visits from today  PLANNED INTERVENTIONS: Therapeutic exercises, Therapeutic activity,  Neuromuscular re-education, Patient/Family education, Joint mobilization, Aquatic Therapy, Dry Needling, Spinal mobilization, Cryotherapy, Moist heat, scar mobilization, Taping, Biofeedback, and Manual therapy  PLAN FOR NEXT SESSION: coordination of pelvic floor and breathing mechanics with increased level of strengthening   Stacy Gardner, PT, DPT 09/26/239:33 AM

## 2022-06-10 ENCOUNTER — Ambulatory Visit: Payer: BC Managed Care – PPO | Attending: Obstetrics and Gynecology | Admitting: Physical Therapy

## 2022-06-10 DIAGNOSIS — R279 Unspecified lack of coordination: Secondary | ICD-10-CM | POA: Insufficient documentation

## 2022-06-10 DIAGNOSIS — R293 Abnormal posture: Secondary | ICD-10-CM | POA: Diagnosis present

## 2022-06-10 DIAGNOSIS — M6281 Muscle weakness (generalized): Secondary | ICD-10-CM | POA: Insufficient documentation

## 2022-06-10 NOTE — Therapy (Signed)
OUTPATIENT PHYSICAL THERAPY FEMALE PELVIC TREATMENT   Patient Name: Brittany Barton MRN: [id number] DOB:1960/11/25, 61 y.o., female Today's Date: 06/10/2022   PT End of Session - 06/10/22 0849     Visit Number 11    Date for PT Re-Evaluation 09/02/22    Authorization Type [employer]    PT Start Time 0845    PT Stop Time 0926    PT Time Calculation (min) 41 min    Activity Tolerance Patient tolerated treatment well    Behavior During Therapy [facility] for tasks assessed/performed              Past Medical History:  Diagnosis Date   Anemia    Chicken pox    COVID    may 2022 cold s/s cough . blood sugars out of control.   Depression    Resolved. in 80s- meds and counseling.   Diabetes mellitus ([place])    Family history of hemochromatosis    father- screen iron   Gestational diabetes 09/08/1985   a1c q3 years   Hammer toe    Lichen planus    Sees [facility] dermatology - Dr. Particia Nearing   Morton's neuroma    per records,pt.denies 10/15/21   Wears glasses    Past Surgical History:  Procedure Laterality Date   DILATATION & CURETTAGE/HYSTEROSCOPY WITH MYOSURE N/A 03/03/2022   Procedure: hysterscopic myomectomy dilation and curretage;  Surgeon: Nunzio Cobbs, MD;  Location: [facility];  Service: Gynecology;  Laterality: N/A;  hysterscopic myomectomy dilation and curretage   ENDOMETRIAL ABLATION  2010   heavy menstrual bleeding   TONSILLECTOMY AND ADENOIDECTOMY  1965   Patient Active Problem List   Diagnosis Date Noted   COVID-19 virus infection 01/25/2021   Hyponatremia 10/16/2020   LADA (latent autoimmune diabetes of adulthood) ([place]) 11/23/2018   Fever blister 08/04/2016   Hyperlipidemia 08/04/2016   Depression    Family history of hemochromatosis     PCP: Marin Olp, MD  REFERRING PROVIDER: Nunzio Cobbs, MD  REFERRING DIAG: N81.2 (ICD-10-CM) - Incomplete uterovaginal prolapse R39.15 (ICD-10-CM) - Urinary urgency  THERAPY  DIAG:  Muscle weakness (generalized)  Unspecified lack of coordination  Abnormal posture  Rationale for Evaluation and Treatment Rehabilitation  ONSET DATE: prolapse started 3 years ago  SUBJECTIVE:                                                                                                                                                                                           SUBJECTIVE STATEMENT: Pt reports she has continued to see improvement   Fluid intake: Yes: 8 glasses  of water per day, coffee in morning 1 cup, sometimes one cup of green tea     PAIN:  Are you having pain? No   PLOF: Independent  PATIENT GOALS to have less prolapse symptoms.   PERTINENT HISTORY:  endometrial ablation with postmenopausal bleeding, endometrial mass 2.5 x 1.3 cm, hysteroscopy with dilation and curettage and Myosurgical resection of endometrial mass 03/03/22 Sexual abuse: No  BOWEL MOVEMENT Pain with bowel movement: No Type of bowel movement:Type (Bristol Stool Scale) 4, Frequency daily, and Strain No Fully empty rectum: Yes:   Leakage: No Pads: No Fiber supplement: No  URINATION Pain with urination: No Fully empty bladder: Yes:   Stream: Strong and Weak Urgency: Yes:   Frequency: nighttime 1-4x; day time not quicker than every 2 hours Leakage:  not getting to toilet quick enough but that is rare Pads: No  INTERCOURSE Pain with intercourse: Initial Penetration and During Penetration Ability to have vaginal penetration:  Yes:   Climax: not painful Marinoff Scale: 0/3 Does report dryness   PREGNANCY Vaginal deliveries 2 Tearing Yes: episiotomy with first; doesn't think she had tearing with second C-section deliveries 0 Currently pregnant No  PROLAPSE Cystocele  , Urethrocele  , and Rectocele    per pt    OBJECTIVE:   DIAGNOSTIC FINDINGS:    COGNITION:  Overall cognitive status: Within functional limits for tasks assessed     SENSATION:  Light touch:  Appears intact  Proprioception: Appears intact  MUSCLE LENGTH: Bil adductors and hamstrings limited by 25%                POSTURE: No Significant postural limitations   LUMBARAROM/PROM  A/PROM A/PROM  eval  Flexion WFL  Extension WFL  Right lateral flexion Limited by 25%  Left lateral flexion Limited by 25%  Right rotation WFL  Left rotation WFL   (Blank rows = not tested)  LOWER EXTREMITY ROM:  WFL  LOWER EXTREMITY MMT:  Bil hip abduction 3+5, flexion and extension 4/5; knees and ankles 5/5   PALPATION:   General  no TTP                External Perineal Exam WFL, dryness noted at vulva                             Internal Pelvic Floor no TTP  Patient confirms identification and approves PT to assess internal pelvic floor and treatment Yes  PELVIC MMT:   MMT eval 05/14/22   Vaginal 3/5 improved to 4/5 with exhale coordinated with contraction; 8s isometrics, 5 reps 4/5 10s 10 reps Pt did have 5/5 holds for 3-4s and 5reps  Internal Anal Sphincter    External Anal Sphincter    Puborectalis    Diastasis Recti    (Blank rows = not tested)        TONE: Mildly decreased  PROLAPSE: Anterior wall laxity noted in hooklying with strong cough x5  possible grade 2 based on descent of tissue; posterior wall laxity noted as well possible grade 1  05/14/22: grade 1 with contraction with cough of anterior wall laxity lesser grade 2 without contraction of anterior wall; did not see laxity of posterior wall today   Pt has not had pessary in since last night; tested in hooklying   TODAY'S TREATMENT   06/10/2022: Constance Haw with marching 2x10 each Bear plank 2x10 Leg press x10 70# Cable: 5# bicep curls x10, tricep curls  x10, palloffs x10, rotational palloffs Bosu x10 squats Lunges 10# x10 each Happy baby 2x30s Pigeon 2x30s Hamstring stretch 2x30s   06/03/2022:  Bridges with marching 3x10 each Leg press 70# x10 Lat pull down 40# x10 OHP 6# each x10 Bicep curls 6#  x10 each Punches 3# x10 each     PATIENT EDUCATION:  Education details: [id number] Person educated: Patient Education method: Consulting civil engineer, Media planner, Corporate treasurer cues, Verbal cues, and Handouts Education comprehension: verbalized understanding and returned demonstration   HOME EXERCISE PROGRAM: [id number]  ASSESSMENT:  CLINICAL IMPRESSION: Patient session focused on strengthening exercises with emphasis being on gym based exercises and machines for improved pt's confidence and and good technique for returning to gym post DC and introducing stretching for improved mobility. Pt continues to need skilled intervention for correction and activation of core/pelvic floor.    OBJECTIVE IMPAIRMENTS decreased coordination, decreased endurance, decreased strength, impaired flexibility, improper body mechanics, and postural dysfunction.   ACTIVITY LIMITATIONS carrying, lifting, squatting, and continence  PARTICIPATION LIMITATIONS: interpersonal relationship and community activity  PERSONAL FACTORS Fitness, Time since onset of injury/illness/exacerbation, and 1 comorbidity: medical history  are also affecting patient's functional outcome.   REHAB POTENTIAL: Good  CLINICAL DECISION MAKING: Stable/uncomplicated  EVALUATION COMPLEXITY: Low   GOALS: Goals reviewed with patient? Yes  SHORT TERM GOALS: Target date: 04/15/2022  Pt to be I with HEP.  Baseline: Goal status: MET  2.  Pt will have 25% less urgency due to bladder retraining and strengthening  Baseline:  Goal status: MET  3.  Pt to demonstrate at least 4/5 pelvic floor strength for improved pelvic stability and decreased strain at pelvic floor.  Baseline:  Goal status: MET  4.  Pt to be I with voiding and breathing mechanics to decrease strain at prolapse and pelvic floor  Baseline:  Goal status: MET    LONG TERM GOALS: Target date:  06/18/22    Pt to be I with advanced HEP.  Baseline:  Goal status: MET  2.  Pt will  have 50% less urgency due to bladder retraining and strengthening  Baseline:  Goal status: MET  3.  Pt to demonstrate at least 4/5 pelvic floor strength with ability to hold contraction for 8s for improved pelvic stability and decreased strain at pelvic floor/ decrease leakage.  Baseline:  Goal status: MET  4.  Pt to demonstrate at least 5/5 bil hip strength for improved pelvic stability and functional squats without leakage.  Baseline:  Goal status: on going  5.  Pt to demonstrate improved coordination of pelvic floor and breathing mechanics for squatting at least 20# without prolapse symptoms for improved pelvic stability and decreased stress at pelvic floor.  Baseline:  Goal status: on going  6.  Pt to be I and confident with setup and good technique for leg press for 90# without cues for improved safety at gym post discharge with core and pelvic floor activation.  Baseline:  Goal status: NEW    PLAN: PT FREQUENCY: 1x/week - every other week  PT DURATION: other: 5 more visits from today  PLANNED INTERVENTIONS: Therapeutic exercises, Therapeutic activity, Neuromuscular re-education, Patient/Family education, Joint mobilization, Aquatic Therapy, Dry Needling, Spinal mobilization, Cryotherapy, Moist heat, scar mobilization, Taping, Biofeedback, and Manual therapy  PLAN FOR NEXT SESSION: coordination of pelvic floor and breathing mechanics with increased level of strengthening   Stacy Gardner, PT, DPT 10/03/239:27 AM

## 2022-06-11 ENCOUNTER — Encounter: Payer: Self-pay | Admitting: Internal Medicine

## 2022-06-13 ENCOUNTER — Telehealth (HOSPITAL_COMMUNITY): Payer: Self-pay

## 2022-06-13 ENCOUNTER — Ambulatory Visit (INDEPENDENT_AMBULATORY_CARE_PROVIDER_SITE_OTHER): Payer: BC Managed Care – PPO | Admitting: Internal Medicine

## 2022-06-13 ENCOUNTER — Other Ambulatory Visit: Payer: Self-pay

## 2022-06-13 ENCOUNTER — Other Ambulatory Visit (HOSPITAL_COMMUNITY): Payer: Self-pay

## 2022-06-13 ENCOUNTER — Encounter: Payer: Self-pay | Admitting: Internal Medicine

## 2022-06-13 ENCOUNTER — Other Ambulatory Visit: Payer: Self-pay | Admitting: Internal Medicine

## 2022-06-13 VITALS — BP 128/70 | HR 80 | Ht 61.0 in | Wt 117.8 lb

## 2022-06-13 DIAGNOSIS — E871 Hypo-osmolality and hyponatremia: Secondary | ICD-10-CM

## 2022-06-13 DIAGNOSIS — E785 Hyperlipidemia, unspecified: Secondary | ICD-10-CM | POA: Diagnosis not present

## 2022-06-13 DIAGNOSIS — E139 Other specified diabetes mellitus without complications: Secondary | ICD-10-CM

## 2022-06-13 LAB — POCT GLYCOSYLATED HEMOGLOBIN (HGB A1C): Hemoglobin A1C: 5.9 % — AB (ref 4.0–5.6)

## 2022-06-13 MED ORDER — NOVOPEN ECHO DEVI: 3 refills | Status: AC

## 2022-06-13 MED ORDER — BASAGLAR KWIKPEN 100 UNIT/ML ~~LOC~~ SOPN: PEN_INJECTOR | SUBCUTANEOUS | 3 refills | Status: DC

## 2022-06-13 MED ORDER — BASAGLAR KWIKPEN 100 UNIT/ML ~~LOC~~ SOPN
PEN_INJECTOR | SUBCUTANEOUS | 3 refills | Status: DC
Start: 2022-06-13 — End: 2022-06-13

## 2022-06-13 MED ORDER — DEXCOM G7 SENSOR MISC: 3.0000 | 4 refills | Status: DC

## 2022-06-13 MED ORDER — DEXCOM G7 RECEIVER DEVI: 1.0000 | Freq: Once | 0 refills | Status: DC

## 2022-06-13 NOTE — Telephone Encounter (Signed)
Received notification from [facility] regarding a prior authorization for Dexcom G6 sensor and Dexcom G7 Receiver.   Authorization has been submitted to [place].  Dexcom G6 Sensor: Key: [id number]  PA Case ID: [id number]   Dexcom G7 Receiver device: Key: [id number] PA Case ID: [id number]

## 2022-06-13 NOTE — Patient Instructions (Addendum)
Please continue: - Basaglar 3-4 units in am and 4-5 units at bedtime - FiAsp: insulin to carb ratio 1:25-30 (always use the 1:30 if going for a walk around dinnertime) Inject FiAsp 10-15 min before coffee.  Use the following sliding scale:  Target: 150 Correction factor: 100 (at night, try to use only 1 unit for correction)  Please return in 4 months.

## 2022-06-13 NOTE — Progress Notes (Signed)
Patient ID: Brittany Barton, female   DOB: 1961-03-16, 61 y.o.   MRN: [id number]   HPI: Brittany Barton is a 61 y.o.-year-old female, initially referred by her PCP, Dr. 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  Last visit 4 months ago.  Interim history: No increased urination, blurry vision, nausea, chest pain. She had postmenopausal bleeding and had an endometrial biopsy since last visit.  This was benign.  She does not need to have hysterectomy.  Reviewed HbA1c levels: Lab Results  Component Value Date   HGBA1C 5.6 02/10/2022   HGBA1C 6.1 (A) 10/21/2021   HGBA1C 5.7 (A) 06/18/2021   HGBA1C 5.8 (A) 02/19/2021   HGBA1C 5.9 (A) 10/16/2020   HGBA1C 5.8 (A) 06/12/2020   HGBA1C 5.7 (A) 02/08/2020   HGBA1C 5.7 (A) 10/20/2019   HGBA1C 5.6 06/20/2019   HGBA1C 7.3 (A) 03/17/2019   She is on: - Basaglar- added 10/2019: 4-6 ...>>  7-8 units at bedtime >> .Marland KitchenMarland Kitchen5 units in am and 4 units at night >> 5 units in am and 3 units at night >> 5 units in am and 2 units at bedtime >> 3-4 units in am and 3-5 units at night - FiAsp 1:22-30 >> 1:25-30 (always use the 1:30 if going for a walk around dinnertime) Inject FiAsp 10-15 min before coffee.  She checks her sugars more than 4 times a day with her Dexcom CGM:   Previously:   Previously:   Lowest sugar was 40 >> ...  50s >> 60; she has hypoglycemia awareness at 83. Highest sugar was 350 (COVID infection) >> ... 200s >> 200s.  Glucometer: Accuchek   She continues on a plant-based diet without high glycemic index foods. In 02/2015 she started a plant-based diet and lost more than 80 pounds.  Pt's meals are: - Breakfast: Raw/soaked oatmeal, blueberries or other fruit, ground flaxseed, walnuts (2 tablespoons), soy milk - Lunch: Beans, whole grains, leafy greens, other vegetables and fruit - Dinner: Salad with  beans/peas or vegetable-based soup. - Snacks: 0-2-fruit, veggies, homemade bean dip, PB  -No CKD, last BUN/creatinine:  Lab Results  Component Value Date   BUN 8 03/03/2022   BUN 8 08/21/2021   CREATININE 0.44 03/03/2022   CREATININE 0.54 08/21/2021  Not on ACE inhibitor/ARB.  -+ History of HL, diet controlled; last set of lipids: Lab Results  Component Value Date   CHOL 149 08/21/2021   HDL 78.20 08/21/2021   LDLCALC 62 08/21/2021   TRIG 45.0 08/21/2021   CHOLHDL 2 08/21/2021  No statins.  - last eye exam was 02/2021: No DR  - NO numbness and tingling in her feet. Last foot exam: 08/2021.  Pt has FH of DM in MGM.  Hyponatremia: She has a history of higher potassium and low sodium: Lab Results  Component Value Date   NA 131 (L) 03/03/2022   NA 129 (L) 08/21/2021   NA 133 (L) 02/19/2021   NA 130 (L) 08/16/2020   NA 126 (L) 08/29/2019   NA 126 (L) 08/15/2019   NA 130 (L) 03/17/2019   NA 133 (L) 11/08/2018   NA 129 (L) 08/04/2018   NA 134 (L) 08/06/2017   NA 134 (L) 07/29/2016   NA 136 07/31/2015   NA 139 05/24/2013   Lab Results  Component Value Date   K 4.1 03/03/2022   K 4.8 08/21/2021   K 4.3 02/19/2021  K 4.6 08/16/2020   K 4.0 08/29/2019   K 4.5 08/15/2019   K 4.2 03/17/2019   K 5.7 (H) 11/08/2018   K 3.8 08/04/2018   K 4.0 08/06/2017   Investigation for hyponatremia reviewed: Component     Latest Ref Rng & Units 08/16/2020  Glucose     65 - 99 mg/dL 60 (L)  BUN     7 - 25 mg/dL 8  Creatinine     0.50 - 0.99 mg/dL 0.50  GFR, Est Non African American     > OR = 60 mL/min/1.58m 105  Sodium     135 - 146 mmol/L 130 (L)  Potassium     3.5 - 5.3 mmol/L 4.6  Chloride     98 - 110 mmol/L 94 (L)  Osmolality, Urine     50 - 1,200 mOsm/kg 142  Osmolality     278 - 305 mOsm/kg 268 (L)  Sodium, Urine     28 - 272 mmol/L 33   Plasma sodium was 130, slightly low >> hyponatremia Plasma osmolality was 268, lower than 275-280 >> hypoosmolar  hyponatremia Urine sodium was 33, not very low >> ruling out excessive fluid losses (chronic diuretics, GI losses, excessive perspiration) or water overload Urine osmolality was 142, higher than 100 >> inappropriately high for hyponatremia  We ruled out adrenal insufficiency by a normal cosyntropin stimulation test: Component     Latest Ref Rng & Units 11/30/2018 11/30/2018 11/30/2018         8:15 AM  8:59 AM  9:26 AM  Cortisol, Plasma     ug/dL 16.8 24.2 28.0  C206 ACTH     6 - 50 pg/mL 15     Also, she does not have a history of hypothyroidism: Lab Results  Component Value Date   TSH 1.07 11/08/2018   TSH 1.36 07/29/2016   TSH 2.41 07/31/2015   She is not on HCTZ, carbamazepine, amitriptyline, SSRIs, opiates. No history of brain injury. No history of generalized infections. No history of malignancy. No recent brain or chest imaging. No chronic kidney disease.  At previous visits, I advised her to try to reduce her fluid intake to 1 L a day.  She was able to limit her intake to 1.5 L a day, but this was difficult for her to do. She had a lot of fluid retention during her COVID-19 episode from 01/2021 and after taking the antiviral medication she eliminated this (approximately 7 to 8 pounds).  Mother also has a history of hyponatremia. Brother with otosclerosis.  ROS: + See HPI  I reviewed pt's medications, allergies, PMH, social hx, family hx, and changes were documented in the history of present illness. Otherwise, unchanged from my initial visit note.  Past Medical History:  Diagnosis Date   Anemia    Chicken pox    COVID    may 2022 cold s/s cough . blood sugars out of control.   Depression    Resolved. in 457s meds and counseling.   [place]    Family history of hemochromatosis    father- screen iron   Gestational diabetes 09/08/1985   a1c q3 years   Hammer toe    Lichen planus    [facility]  Morton's neuroma     per records,pt.denies 10/15/21   Wears glasses    Past Surgical History:  Procedure Laterality Date   DILATATION & CURETTAGE/HYSTEROSCOPY WITH MYOSURE N/A 03/03/2022   Procedure: hysterscopic  myomectomy dilation and curretage;  Surgeon: Nunzio Cobbs, MD;  Location: [facility];  Service: Gynecology;  Laterality: N/A;  hysterscopic myomectomy dilation and curretage   ENDOMETRIAL ABLATION  2010   heavy menstrual bleeding   TONSILLECTOMY AND ADENOIDECTOMY  1965   Social History   Socioeconomic History   Marital status: Married    Spouse name: Not on file   Number of children: 2   Years of education: Not on file   Highest education level: Not on file  Occupational History    Librarian  Social Needs   Financial resource strain: Not on file   Food insecurity:    Worry: Not on file    Inability: Not on file   Transportation needs:    Medical: Not on file    Non-medical: Not on file  Tobacco Use   Smoking status: Never Smoker   Smokeless tobacco: Never Used  Substance and Sexual Activity   Alcohol use: Yes    Alcohol/week:  2-4 drinks per week    Types:  Wine/beer   Drug use: No   Sexual activity: Yes  Lifestyle   Physical activity: Walk/hike/spin class/weight resistance/stretching    Days per week: 7    Minutes per session: Not on file  Relationships  Social History Narrative   Married. (husband Jeneen Rinks, patient of Dr. Yong Barton). 2 children grown and married. B/c grandmother December 2016.    Has had 76 moves in 73 years, most recently [place] for 1.5 years, [place] 5 years prior, [place] prior      [place]. In nutrition. Masters in [employer]. In between jobs as Licensed conveyancer.       Current Outpatient Medications on File Prior to Visit  Medication Sig Dispense Refill   Accu-Chek FastClix Lancets MISC Use as instructed 204 each 3   acyclovir ointment (ZOVIRAX) 5 % Apply 1 application topically every 3 (three) hours. (Patient taking differently:  Apply 1 application  topically every 3 (three) hours. Not taking now    takes for fever blister) 30 g 1   BAQSIMI TWO PACK 3 MG/DOSE POWD Use as needed for hypoglycemia 1 each PRN   Blood Glucose Calibration (ACCU-CHEK GUIDE CONTROL) LIQD To calibrate Accu-Chek Guide meter 1 each 11   Cholecalciferol 50 MCG (2000 UT) CAPS Take 1 capsule by mouth daily.     Continuous Blood Gluc Sensor (DEXCOM G6 SENSOR) MISC INJECT 1 DEVICE INTO THE SKIN CONTINUOUS FOR 10 DAYS. 9 each 3   Continuous Blood Gluc Transmit (DEXCOM G6 TRANSMITTER) MISC APPLY EVERY 3 (THREE) MONTHS. 1 each 3   glucose blood test strip Use as instructed to check blood sugar 4 times a day - Accuchek Guide 100 each 3   Insulin Aspart, w/Niacinamide, (FIASP PENFILL) 100 UNIT/ML SOCT Use up to 20 units a day as advised 15 mL 11   Insulin Glargine (BASAGLAR KWIKPEN) 100 UNIT/ML Inject 6 units in a.m. and 3 units at bedtime under skin, as advised (Patient taking differently: Inject 5 units in a.m. and 4 units at bedtime under skin, as advised) 30 mL 3   Insulin Pen Needle (BD PEN NEEDLE NANO 2ND GEN) 32G X 4 MM MISC USE 6-8 TIMES A DAY FOR INSULIN 500 each 3   NOVOPEN ECHO DEVI once.     tetrahydrozoline-zinc (VISINE-AC) 0.05-0.25 % ophthalmic solution Place 1 drop into both eyes 3 (three) times daily as needed.     triamcinolone cream (KENALOG) 0.1 % For autoimmune rash, not taking  right now     vitamin B-12 (CYANOCOBALAMIN) 250 MCG tablet Take 250 mcg by mouth daily. Takes it once a week     No current facility-administered medications on file prior to visit.   Allergies  Allergen Reactions   Penicillins Rash   Sulfa Antibiotics Rash   Family History  Problem Relation Age of Onset   Osteoporosis Mother    Depression Mother        dealing with some memory issues   Glaucoma Mother    Dementia Mother    Hemochromatosis Father    Alcoholism Father    Hearing loss Brother        otosclerosis   Hypertension Brother    Hyperlipidemia  Brother    Diabetes Brother        ? PE as well- she will verify   Hyperlipidemia Brother    Stroke Other        grandparent   Hypertension Other        grandparent   Colon cancer Neg Hx    Colon polyps Neg Hx    Esophageal cancer Neg Hx    Rectal cancer Neg Hx    Stomach cancer Neg Hx    PE: BP 128/70 (BP Location: Right Arm, Patient Position: Sitting, Cuff Size: Normal)   Pulse 80   Ht '5\' 1"'$  (1.549 m)   Wt 117 lb 12.8 oz (53.4 kg)   LMP 09/09/2007 (Approximate) Comment: this was with her endometrial ablation  SpO2 93%   BMI 22.26 kg/m  Wt Readings from Last 3 Encounters:  06/13/22 117 lb 12.8 oz (53.4 kg)  05/19/22 119 lb (54 kg)  03/17/22 118 lb (53.5 kg)   Constitutional: normal weight, in NAD Eyes:  EOMI, no exophthalmos ENT: no neck masses, no cervical lymphadenopathy Cardiovascular: RRR, No MRG Respiratory: CTA B Musculoskeletal: no deformities Skin:no rashes Neurological: no tremor with outstretched hands  ASSESSMENT: 1. LADA, insulin-dependent, controlled, without long-term complications but with occasional hyperglycemia  She had low C-peptide and high GAD antibodies: Component     Latest Ref Rng & Units 03/17/2019  Glucose     65 - 99 mg/dL 192 (H)  C-Peptide     0.80 - 3.85 ng/mL 0.55 (L)   Component     Latest Ref Rng & Units 11/08/2018  Glucose     70 - 99 mg/dL 130 (H)  C-Peptide     0.80 - 3.85 ng/mL 0.55 (L)  Glutamic Acid Decarb Ab     <5 IU/mL >250 (H)  Insulin Antibodies, Human     <0.4 U/mL 3.6 (H)   Component     Latest Ref Rng & Units 11/30/2018  ZNT8 Antibodies     U/mL <15  Islet Cell Ab     Neg:<1:1 Negative   2. HL  3. Hyponatremia  PLAN:  1. Patient with history of LADA, diagnosed in 2020, based on positive GAD antibodies and decreased C-peptide.  She continues on a plant-based diet, with low glycemic index foods, and exercising consistently.  She was able to avoid starting insulin for a long time but we ended up adding  mealtime insulin in 2020 and long-acting insulin 2021.  She is very insulin sensitive and we use very low doses of insulin for her.  She is currently on Careers information officer.  She is doing better on a 2x a day Basaglar regimen.  At last visit, we decreased her Basaglar dose at night as sugars were trending down excessively overnight.  She sometimes was exercising after dinner and her sugars were lower overnight in those situations.  We discussed about using a more relaxed insulin to carb ratio before dinner if she was planning to walk after the meal.  In fact, I advised her to relax her insulin to carb ratio with all of her meals since the sugars appears to be also dropping after breakfast and lunch.  She was inquiring about intermittent fasting but I did not recommend this for her.  She had done phenomenally we discussed that if she was not eating in the morning, she may still need to take 1 unit of insulin for correction. CGM interpretation: -At today's visit, we reviewed her CGM downloads: It appears that 80% of values are in target range (goal >70%), while 16% are higher than 180 (goal <25%), and 3-4% are lower than 70 (goal <4%).  The calculated average blood sugar is 134.  The projected HbA1c for the next 3 months (GMI) is 6.5%. -Reviewing the CGM trends, sugars appear to be fairly well controlled overnight, but they do increase in the early morning hours.  Upon questioning, she  has increased her Basaglar dose at night (3-4, rarely 5 units) and she is taking a much lower dose in the morning since she felt that her sugars were too low during the day when she was taking 5 units in the morning.  Since the sugars are still increasing after 6 in the morning, we did discuss about potentially taking 4-5 units every night and continue with a lower dose in the morning.  If this drops her blood sugars too low overnight, she may just need to wake up and give herself 1 unit of insulin at waking up, as we discussed at  last visit.  Sugars are also occasionally higher after dinner and rarely after breakfast, however, this is just in the last 2 weeks.  The previous 2 weeks showed quite well-controlled blood sugars. -We also discussed about not to correct with more than 1 units of Fiasp at bedtime.  As of now, her sugars are dropping too much overnight if she takes 1.5 units for correction even if the sugars are in the 200s. -I did give her a formal sliding scale.  She was using a 1: 50 ISF but we discussed about trying 1: 50 especially later in the day -I advised her to watch for low blood sugars especially if she walks at night with her husband, which she occasionally does.  She continues to exercise in the morning when the sugars are really high and this does bring down her sugars, usually to the target range, not necessarily too low -At this visit, she quires about the Dexcom G7 -her insurance does cover it.  I sent a prescription for this to her pharmacy. -She also inquires about OmniPod 5.  We discussed about the more intense supervision of her diabetes at this point requires and the fact that she may not adjust the target as low as she is now.  Also, she is using very little insulin, and I am not sure how well this would work for her.  For now, she would not want to try it. - I advised her to: Patient Instructions  Please continue: - Basaglar 3-4 units in am and 4-5 units at bedtime - FiAsp: insulin to carb ratio 1:25-30 (always use the 1:30 if going for a walk around dinnertime) Inject FiAsp 10-15 min before coffee.  Use the following sliding scale:  Target:  150 Correction factor: 100 (at night, try to use only 1 unit for correction)  Please return in 4 months.  - we checked her HbA1c: 5.9% (higher) - advised to check sugars at different times of the day - 4x a day, rotating check times - advised for yearly eye exams >> she is UTD - return to clinic in 4 months  2. HL -Reviewed latest lipid panel from  08/2021: All fractions at goal: Lab Results  Component Value Date   CHOL 149 08/21/2021   HDL 78.20 08/21/2021   LDLCALC 62 08/21/2021   TRIG 45.0 08/21/2021   CHOLHDL 2 08/21/2021  -She is not on a statin but has excellent lipid levels on the plant-based diet -Last visit, I recommended against intermittent fasting  3.  Hyponatremia -Asymptomatic: No headaches, mental fog, disequilibrium.  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  If sodium is still low, she may have reset osmostat.  Since sodium was still slightly low, she most likely has reset osmostat, which can be just followed -Reviewed latest sodium level from 03/03/2022: 131, improved, only slightly low  - Total time spent for the visit: 40 minutes, in precharting, obtaining medical information from the patient and from the chart, reviewing her  previous labs, imaging evaluations, and treatments, reviewing her symptoms, counseling her about her Beatties (please see the discussed topics above), and developing a plan to further avoid hyperand hypoglycemia; she had several questions, which we addressed.  Philemon Kingdom, MD PhD [facility]

## 2022-06-16 ENCOUNTER — Other Ambulatory Visit (HOSPITAL_COMMUNITY): Payer: Self-pay

## 2022-06-16 NOTE — Telephone Encounter (Signed)
Received notification from [facility] regarding a prior authorization for Dexcom G6 Sensor Authorization has been APPROVED from 06/13/2022 to 06/14/2023.    Dexcom G6 Sensor Authorization # PA Case ID: [id number] Key# [id number]   Dexcome G7 Receiver device PA request has been closed. Duplicate request cannot be processed. Approval on file.  Authorization #  PA Case ID: [id number]  Key #  [id number]

## 2022-06-19 ENCOUNTER — Other Ambulatory Visit: Payer: Self-pay | Admitting: Internal Medicine

## 2022-06-24 ENCOUNTER — Other Ambulatory Visit: Payer: Self-pay

## 2022-06-24 DIAGNOSIS — E139 Other specified diabetes mellitus without complications: Secondary | ICD-10-CM

## 2022-06-24 MED ORDER — FIASP PENFILL 100 UNIT/ML ~~LOC~~ SOCT: SUBCUTANEOUS | 11 refills | Status: DC

## 2022-06-27 ENCOUNTER — Ambulatory Visit: Payer: BC Managed Care – PPO | Admitting: Physical Therapy

## 2022-06-27 DIAGNOSIS — M6281 Muscle weakness (generalized): Secondary | ICD-10-CM | POA: Diagnosis not present

## 2022-06-27 DIAGNOSIS — R279 Unspecified lack of coordination: Secondary | ICD-10-CM

## 2022-06-27 NOTE — Therapy (Signed)
OUTPATIENT PHYSICAL THERAPY FEMALE PELVIC TREATMENT   Patient Name: Brittany Barton MRN: [id number] DOB:02/11/1961, 61 y.o., female Today's Date: 06/27/2022   PT End of Session - 06/27/22 0846     Visit Number 12    Date for PT Re-Evaluation 09/02/22    Authorization Type BCBS    PT Start Time 0845    PT Stop Time 0929    PT Time Calculation (min) 44 min    Activity Tolerance Patient tolerated treatment well    Behavior During Therapy [facility] for tasks assessed/performed               Past Medical History:  Diagnosis Date   Anemia    Chicken pox    COVID    may 2022 cold s/s cough . blood sugars out of control.   Depression    Resolved. in 25s- meds and counseling.   Diabetes mellitus ([place])    Family history of hemochromatosis    father- screen iron   Gestational diabetes 09/08/1985   a1c q3 years   Hammer toe    Lichen planus    Sees [facility] dermatology - Dr. Particia Nearing   Morton's neuroma    per records,pt.denies 10/15/21   Wears glasses    Past Surgical History:  Procedure Laterality Date   DILATATION & CURETTAGE/HYSTEROSCOPY WITH MYOSURE N/A 03/03/2022   Procedure: hysterscopic myomectomy dilation and curretage;  Surgeon: Nunzio Cobbs, MD;  Location: [facility];  Service: Gynecology;  Laterality: N/A;  hysterscopic myomectomy dilation and curretage   ENDOMETRIAL ABLATION  2010   heavy menstrual bleeding   TONSILLECTOMY AND ADENOIDECTOMY  1965   Patient Active Problem List   Diagnosis Date Noted   COVID-19 virus infection 01/25/2021   Hyponatremia 10/16/2020   LADA (latent autoimmune diabetes of adulthood) ([place]) 11/23/2018   Fever blister 08/04/2016   Hyperlipidemia 08/04/2016   Depression    Family history of hemochromatosis     PCP: Marin Olp, MD  REFERRING PROVIDER: Nunzio Cobbs, MD  REFERRING DIAG: N81.2 (ICD-10-CM) - Incomplete uterovaginal prolapse R39.15 (ICD-10-CM) - Urinary  urgency  THERAPY DIAG:  No diagnosis found.  Rationale for Evaluation and Treatment Rehabilitation  ONSET DATE: prolapse started 3 years ago  SUBJECTIVE:                                                                                                                                                                                           SUBJECTIVE STATEMENT: "Being able to work with PT has made a huge improvement. I feel stronger and more not as scared to work out".  Fluid intake: Yes: 8 glasses of water per day, coffee in morning 1 cup, sometimes one cup of green tea     PAIN:  Are you having pain? No   PLOF: Independent  PATIENT GOALS to have less prolapse symptoms.   PERTINENT HISTORY:  endometrial ablation with postmenopausal bleeding, endometrial mass 2.5 x 1.3 cm, hysteroscopy with dilation and curettage and Myosurgical resection of endometrial mass 03/03/22 Sexual abuse: No  BOWEL MOVEMENT Pain with bowel movement: No Type of bowel movement:Type (Bristol Stool Scale) 4, Frequency daily, and Strain No Fully empty rectum: Yes:   Leakage: No Pads: No Fiber supplement: No  URINATION Pain with urination: No Fully empty bladder: Yes:   Stream: Strong and Weak Urgency: Yes:   Frequency: nighttime 1-4x; day time not quicker than every 2 hours Leakage:  not getting to toilet quick enough but that is rare Pads: No  INTERCOURSE Pain with intercourse: Initial Penetration and During Penetration Ability to have vaginal penetration:  Yes:   Climax: not painful Marinoff Scale: 0/3 Does report dryness   PREGNANCY Vaginal deliveries 2 Tearing Yes: episiotomy with first; doesn't think she had tearing with second C-section deliveries 0 Currently pregnant No  PROLAPSE Cystocele  , Urethrocele  , and Rectocele    per pt    OBJECTIVE:   DIAGNOSTIC FINDINGS:    COGNITION:  Overall cognitive status: Within functional limits for tasks  assessed     SENSATION:  Light touch: Appears intact  Proprioception: Appears intact  MUSCLE LENGTH: Bil adductors and hamstrings limited by 25%                POSTURE: No Significant postural limitations   LUMBARAROM/PROM  A/PROM A/PROM  eval  Flexion WFL  Extension WFL  Right lateral flexion Limited by 25%  Left lateral flexion Limited by 25%  Right rotation WFL  Left rotation WFL   (Blank rows = not tested)  LOWER EXTREMITY ROM:  WFL  LOWER EXTREMITY MMT:  Bil hip abduction 3+5, flexion and extension 4/5; knees and ankles 5/5   PALPATION:   General  no TTP                External Perineal Exam WFL, dryness noted at vulva                             Internal Pelvic Floor no TTP  Patient confirms identification and approves PT to assess internal pelvic floor and treatment Yes  PELVIC MMT:   MMT eval 05/14/22   Vaginal 3/5 improved to 4/5 with exhale coordinated with contraction; 8s isometrics, 5 reps 4/5 10s 10 reps Pt did have 5/5 holds for 3-4s and 5reps  Internal Anal Sphincter    External Anal Sphincter    Puborectalis    Diastasis Recti    (Blank rows = not tested)        TONE: Mildly decreased  PROLAPSE: Anterior wall laxity noted in hooklying with strong cough x5  possible grade 2 based on descent of tissue; posterior wall laxity noted as well possible grade 1  05/14/22: grade 1 with contraction with cough of anterior wall laxity lesser grade 2 without contraction of anterior wall; did not see laxity of posterior wall today   Pt has not had pessary in since last night; tested in hooklying   TODAY'S TREATMENT   06/27/22: Elliptical 10 mins L5 for increased challenge of standing exercise and strengthening pelvic floor  to decrease strain at prolapse.   2x10 20# squats Lunges one leg on bosu x10 Bear planks 2x10 Modified plank with 8# dumbbell pull through x10 each Leg press 80# 2x10 each Pigeon 2x30s each Quad stretch  2x30s  06/10/2022: Bridges with marching 2x10 each Bear plank 2x10 Leg press x10 70# Cable: 5# bicep curls x10, tricep curls x10, palloffs x10, rotational palloffs Bosu x10 squats Lunges 10# x10 each Happy baby 2x30s Pigeon 2x30s Hamstring stretch 2x30s       PATIENT EDUCATION:  Education details: [id number] Person educated: Patient Education method: Consulting civil engineer, Media planner, Corporate treasurer cues, Verbal cues, and Handouts Education comprehension: verbalized understanding and returned demonstration   HOME EXERCISE PROGRAM: [id number]  ASSESSMENT:  CLINICAL IMPRESSION: Patient session focused on strengthening exercises with emphasis being on gym based exercises and machines for improved pt's confidence and and good technique for returning to gym post DC and introducing stretching for improved mobility. Pt reports she feels more confident with working out does still have several questions about breathing mechanics with core exercises today, all answered. Session progressed with increased weighted resistance today, more challenging core and hip exercises and new stretching for pt. Pt continues to need skilled intervention for correction and activation of core/pelvic floor.    OBJECTIVE IMPAIRMENTS decreased coordination, decreased endurance, decreased strength, impaired flexibility, improper body mechanics, and postural dysfunction.   ACTIVITY LIMITATIONS carrying, lifting, squatting, and continence  PARTICIPATION LIMITATIONS: interpersonal relationship and community activity  PERSONAL FACTORS Fitness, Time since onset of injury/illness/exacerbation, and 1 comorbidity: medical history  are also affecting patient's functional outcome.   REHAB POTENTIAL: Good  CLINICAL DECISION MAKING: Stable/uncomplicated  EVALUATION COMPLEXITY: Low   GOALS: Goals reviewed with patient? Yes  SHORT TERM GOALS: Target date: 04/15/2022  Pt to be I with HEP.  Baseline: Goal status: MET  2.  Pt  will have 25% less urgency due to bladder retraining and strengthening  Baseline:  Goal status: MET  3.  Pt to demonstrate at least 4/5 pelvic floor strength for improved pelvic stability and decreased strain at pelvic floor.  Baseline:  Goal status: MET  4.  Pt to be I with voiding and breathing mechanics to decrease strain at prolapse and pelvic floor  Baseline:  Goal status: MET    LONG TERM GOALS: Target date:  06/18/22    Pt to be I with advanced HEP.  Baseline:  Goal status: MET  2.  Pt will have 50% less urgency due to bladder retraining and strengthening  Baseline:  Goal status: MET  3.  Pt to demonstrate at least 4/5 pelvic floor strength with ability to hold contraction for 8s for improved pelvic stability and decreased strain at pelvic floor/ decrease leakage.  Baseline:  Goal status: MET  4.  Pt to demonstrate at least 5/5 bil hip strength for improved pelvic stability and functional squats without leakage.  Baseline:  Goal status: on going  5.  Pt to demonstrate improved coordination of pelvic floor and breathing mechanics for squatting at least 20# without prolapse symptoms for improved pelvic stability and decreased stress at pelvic floor.  Baseline:  Goal status: on going  6.  Pt to be I and confident with setup and good technique for leg press for 90# without cues for improved safety at gym post discharge with core and pelvic floor activation.  Baseline:  Goal status: NEW    PLAN: PT FREQUENCY: 1x/week - every other week  PT DURATION: other: 5 more visits from today  PLANNED  INTERVENTIONS: Therapeutic exercises, Therapeutic activity, Neuromuscular re-education, Patient/Family education, Joint mobilization, Aquatic Therapy, Dry Needling, Spinal mobilization, Cryotherapy, Moist heat, scar mobilization, Taping, Biofeedback, and Manual therapy  PLAN FOR NEXT SESSION: coordination of pelvic floor and breathing mechanics with increased level of  strengthening   Stacy Gardner, PT, DPT 10/20/239:31 AM

## 2022-07-09 ENCOUNTER — Ambulatory Visit: Payer: BC Managed Care – PPO | Attending: Obstetrics and Gynecology | Admitting: Physical Therapy

## 2022-07-09 DIAGNOSIS — R279 Unspecified lack of coordination: Secondary | ICD-10-CM | POA: Insufficient documentation

## 2022-07-09 DIAGNOSIS — R293 Abnormal posture: Secondary | ICD-10-CM | POA: Diagnosis present

## 2022-07-09 DIAGNOSIS — M6281 Muscle weakness (generalized): Secondary | ICD-10-CM | POA: Insufficient documentation

## 2022-07-09 NOTE — Therapy (Signed)
OUTPATIENT PHYSICAL THERAPY FEMALE PELVIC TREATMENT   Patient Name: Brittany Barton MRN: [id number] DOB:Aug 22, 1961, 61 y.o., female Today's Date: 07/09/2022   PT End of Session - 07/09/22 0840     Visit Number 13    Date for PT Re-Evaluation 09/02/22    Authorization Type BCBS    PT Start Time 0840    PT Stop Time 0921    PT Time Calculation (min) 41 min    Activity Tolerance Patient tolerated treatment well    Behavior During Therapy [facility] for tasks assessed/performed               Past Medical History:  Diagnosis Date   Anemia    Chicken pox    COVID    may 2022 cold s/s cough . blood sugars out of control.   Depression    Resolved. in 52s- meds and counseling.   Diabetes mellitus ([place])    Family history of hemochromatosis    father- screen iron   Gestational diabetes 09/08/1985   a1c q3 years   Hammer toe    Lichen planus    Sees [facility] dermatology - Dr. Particia Nearing   Morton's neuroma    per records,pt.denies 10/15/21   Wears glasses    Past Surgical History:  Procedure Laterality Date   DILATATION & CURETTAGE/HYSTEROSCOPY WITH MYOSURE N/A 03/03/2022   Procedure: hysterscopic myomectomy dilation and curretage;  Surgeon: Nunzio Cobbs, MD;  Location: [facility];  Service: Gynecology;  Laterality: N/A;  hysterscopic myomectomy dilation and curretage   ENDOMETRIAL ABLATION  2010   heavy menstrual bleeding   TONSILLECTOMY AND ADENOIDECTOMY  1965   Patient Active Problem List   Diagnosis Date Noted   COVID-19 virus infection 01/25/2021   Hyponatremia 10/16/2020   LADA (latent autoimmune diabetes of adulthood) ([place]) 11/23/2018   Fever blister 08/04/2016   Hyperlipidemia 08/04/2016   Depression    Family history of hemochromatosis     PCP: Marin Olp, MD  REFERRING PROVIDER: Nunzio Cobbs, MD  REFERRING DIAG: N81.2 (ICD-10-CM) - Incomplete uterovaginal prolapse R39.15 (ICD-10-CM) - Urinary urgency  THERAPY  DIAG:  Muscle weakness (generalized)  Unspecified lack of coordination  Abnormal posture  Rationale for Evaluation and Treatment Rehabilitation  ONSET DATE: prolapse started 3 years ago  SUBJECTIVE:                                                                                                                                                                                           SUBJECTIVE STATEMENT: Pt reports she has continued to feel better, less symptoms since starting PT. Pleased  with progress   Fluid intake: Yes: 8 glasses of water per day, coffee in morning 1 cup, sometimes one cup of green tea     PAIN:  Are you having pain? No   PLOF: Independent  PATIENT GOALS to have less prolapse symptoms.   PERTINENT HISTORY:  endometrial ablation with postmenopausal bleeding, endometrial mass 2.5 x 1.3 cm, hysteroscopy with dilation and curettage and Myosurgical resection of endometrial mass 03/03/22 Sexual abuse: No  BOWEL MOVEMENT Pain with bowel movement: No Type of bowel movement:Type (Bristol Stool Scale) 4, Frequency daily, and Strain No Fully empty rectum: Yes:   Leakage: No Pads: No Fiber supplement: No  URINATION Pain with urination: No Fully empty bladder: Yes:   Stream: Strong and Weak Urgency: Yes:   Frequency: nighttime 1-4x; day time not quicker than every 2 hours Leakage:  not getting to toilet quick enough but that is rare Pads: No  INTERCOURSE Pain with intercourse: Initial Penetration and During Penetration Ability to have vaginal penetration:  Yes:   Climax: not painful Marinoff Scale: 0/3 Does report dryness   PREGNANCY Vaginal deliveries 2 Tearing Yes: episiotomy with first; doesn't think she had tearing with second C-section deliveries 0 Currently pregnant No  PROLAPSE Cystocele  , Urethrocele  , and Rectocele    per pt    OBJECTIVE:   DIAGNOSTIC FINDINGS:    COGNITION:  Overall cognitive status: Within functional limits  for tasks assessed     SENSATION:  Light touch: Appears intact  Proprioception: Appears intact  MUSCLE LENGTH: Bil adductors and hamstrings limited by 25%                POSTURE: No Significant postural limitations   LUMBARAROM/PROM  A/PROM A/PROM  eval  Flexion WFL  Extension WFL  Right lateral flexion Limited by 25%  Left lateral flexion Limited by 25%  Right rotation WFL  Left rotation WFL   (Blank rows = not tested)  LOWER EXTREMITY ROM:  WFL  LOWER EXTREMITY MMT:  Bil hip abduction 3+5, flexion and extension 4/5; knees and ankles 5/5   PALPATION:   General  no TTP                External Perineal Exam WFL, dryness noted at vulva                             Internal Pelvic Floor no TTP  Patient confirms identification and approves PT to assess internal pelvic floor and treatment Yes  PELVIC MMT:   MMT eval 05/14/22   Vaginal 3/5 improved to 4/5 with exhale coordinated with contraction; 8s isometrics, 5 reps 4/5 10s 10 reps Pt did have 5/5 holds for 3-4s and 5reps  Internal Anal Sphincter    External Anal Sphincter    Puborectalis    Diastasis Recti    (Blank rows = not tested)        TONE: Mildly decreased  PROLAPSE: Anterior wall laxity noted in hooklying with strong cough x5  possible grade 2 based on descent of tissue; posterior wall laxity noted as well possible grade 1  05/14/22: grade 1 with contraction with cough of anterior wall laxity lesser grade 2 without contraction of anterior wall; did not see laxity of posterior wall today   Pt has not had pessary in since last night; tested in hooklying   TODAY'S TREATMENT   07/09/22: X10 body weight squats X10 body weight lunges each 2x10  dead lifts 10# each hand Lunges with front chest press x10 10# X10 squats with bicep curl x10 each arm (20 squats) Heel drop x10 Forward jumps 3x5 Mini jumps in place x10 2x10 skiers Pt educated on vaginal weights as she asked about this post DC to  continue strengthening, pt reported she was interested in them but wasn't sure if they were helpful, reviewed them and their role in strengthening pelvic floor and how to use.      PATIENT EDUCATION:  Education details: [id number] Person educated: Patient Education method: Education officer, environmental, Corporate treasurer cues, Verbal cues, and Handouts Education comprehension: verbalized understanding and returned demonstration   HOME EXERCISE PROGRAM: [id number]  ASSESSMENT:  CLINICAL IMPRESSION: Patient session focused on strengthening exercises with emphasis being on gym based exercises and machines for improved pt's confidence and and good technique with increased challenge of jumping and quicker motion. Pt tolerated well but reported she did feel like it was much more difficult to maintain contraction but no symptoms.pt also states she forgot to put her pessary in for one day and did minimal impact activities at home/in community without symptoms. Pt continues to need skilled intervention for correction and activation of core/pelvic floor.    OBJECTIVE IMPAIRMENTS decreased coordination, decreased endurance, decreased strength, impaired flexibility, improper body mechanics, and postural dysfunction.   ACTIVITY LIMITATIONS carrying, lifting, squatting, and continence  PARTICIPATION LIMITATIONS: interpersonal relationship and community activity  PERSONAL FACTORS Fitness, Time since onset of injury/illness/exacerbation, and 1 comorbidity: medical history  are also affecting patient's functional outcome.   REHAB POTENTIAL: Good  CLINICAL DECISION MAKING: Stable/uncomplicated  EVALUATION COMPLEXITY: Low   GOALS: Goals reviewed with patient? Yes  SHORT TERM GOALS: Target date: 04/15/2022  Pt to be I with HEP.  Baseline: Goal status: MET  2.  Pt will have 25% less urgency due to bladder retraining and strengthening  Baseline:  Goal status: MET  3.  Pt to demonstrate at least 4/5 pelvic  floor strength for improved pelvic stability and decreased strain at pelvic floor.  Baseline:  Goal status: MET  4.  Pt to be I with voiding and breathing mechanics to decrease strain at prolapse and pelvic floor  Baseline:  Goal status: MET    LONG TERM GOALS: Target date:  06/18/22    Pt to be I with advanced HEP.  Baseline:  Goal status: MET  2.  Pt will have 50% less urgency due to bladder retraining and strengthening  Baseline:  Goal status: MET  3.  Pt to demonstrate at least 4/5 pelvic floor strength with ability to hold contraction for 8s for improved pelvic stability and decreased strain at pelvic floor/ decrease leakage.  Baseline:  Goal status: MET  4.  Pt to demonstrate at least 5/5 bil hip strength for improved pelvic stability and functional squats without leakage.  Baseline:  Goal status: on going  5.  Pt to demonstrate improved coordination of pelvic floor and breathing mechanics for squatting at least 20# without prolapse symptoms for improved pelvic stability and decreased stress at pelvic floor.  Baseline:  Goal status: on going  6.  Pt to be I and confident with setup and good technique for leg press for 90# without cues for improved safety at gym post discharge with core and pelvic floor activation.  Baseline:  Goal status: NEW    PLAN: PT FREQUENCY: 1x/week - every other week  PT DURATION: other: 5 more visits from today  PLANNED INTERVENTIONS: Therapeutic exercises, Therapeutic activity, Neuromuscular re-education,  Patient/Family education, Joint mobilization, Aquatic Therapy, Dry Needling, Spinal mobilization, Cryotherapy, Moist heat, scar mobilization, Taping, Biofeedback, and Manual therapy  PLAN FOR NEXT SESSION: coordination of pelvic floor and breathing mechanics with increased level of strengthening   Stacy Gardner, PT, DPT 11/01/239:27 AM

## 2022-07-24 ENCOUNTER — Ambulatory Visit: Payer: BC Managed Care – PPO | Admitting: Physical Therapy

## 2022-07-25 ENCOUNTER — Encounter: Payer: BC Managed Care – PPO | Admitting: Physical Therapy

## 2022-08-05 LAB — HM DIABETES EYE EXAM

## 2022-08-07 ENCOUNTER — Ambulatory Visit: Payer: BC Managed Care – PPO | Admitting: Physical Therapy

## 2022-08-07 ENCOUNTER — Encounter: Payer: Self-pay | Admitting: Family Medicine

## 2022-08-07 DIAGNOSIS — M6281 Muscle weakness (generalized): Secondary | ICD-10-CM

## 2022-08-07 DIAGNOSIS — R293 Abnormal posture: Secondary | ICD-10-CM

## 2022-08-07 DIAGNOSIS — R279 Unspecified lack of coordination: Secondary | ICD-10-CM

## 2022-08-07 NOTE — Therapy (Signed)
OUTPATIENT PHYSICAL THERAPY FEMALE PELVIC TREATMENT   Patient Name: Brittany Barton MRN: [id number] DOB:1961-07-17, 61 y.o., female Today's Date: 08/07/2022   PT End of Session - 08/07/22 0839     Visit Number 14    Date for PT Re-Evaluation 09/02/22    Authorization Type BCBS    PT Start Time 0845    PT Stop Time 0923    PT Time Calculation (min) 38 min    Activity Tolerance Patient tolerated treatment well    Behavior During Therapy [facility] for tasks assessed/performed               Past Medical History:  Diagnosis Date   Anemia    Chicken pox    COVID    may 2022 cold s/s cough . blood sugars out of control.   Depression    Resolved. in 34s- meds and counseling.   Diabetes mellitus ([place])    Family history of hemochromatosis    father- screen iron   Gestational diabetes 09/08/1985   a1c q3 years   Hammer toe    Lichen planus    Sees [facility] dermatology - Dr. Particia Nearing   Morton's neuroma    per records,pt.denies 10/15/21   Wears glasses    Past Surgical History:  Procedure Laterality Date   DILATATION & CURETTAGE/HYSTEROSCOPY WITH MYOSURE N/A 03/03/2022   Procedure: hysterscopic myomectomy dilation and curretage;  Surgeon: Nunzio Cobbs, MD;  Location: [facility];  Service: Gynecology;  Laterality: N/A;  hysterscopic myomectomy dilation and curretage   ENDOMETRIAL ABLATION  2010   heavy menstrual bleeding   TONSILLECTOMY AND ADENOIDECTOMY  1965   Patient Active Problem List   Diagnosis Date Noted   COVID-19 virus infection 01/25/2021   Hyponatremia 10/16/2020   LADA (latent autoimmune diabetes of adulthood) ([place]) 11/23/2018   Fever blister 08/04/2016   Hyperlipidemia 08/04/2016   Depression    Family history of hemochromatosis     PCP: Marin Olp, MD  REFERRING PROVIDER: Nunzio Cobbs, MD  REFERRING DIAG: N81.2 (ICD-10-CM) - Incomplete uterovaginal prolapse R39.15 (ICD-10-CM) - Urinary  urgency  THERAPY DIAG:  Muscle weakness (generalized)  Unspecified lack of coordination  Abnormal posture  Rationale for Evaluation and Treatment Rehabilitation  ONSET DATE: prolapse started 3 years ago  SUBJECTIVE:                                                                                                                                                                                           SUBJECTIVE STATEMENT: Pt states "I have been doing really well. I have been working out more  and slowly increasing weights without any prolapse symptoms." Pt reports urinary frequency at night has improved to 1-2x per night, no pain with intercourse, no prolapse symptoms with daily functions or with working out, and no straining with bowels or bladder.    Fluid intake: Yes: 8 glasses of water per day, coffee in morning 1 cup, sometimes one cup of green tea     PAIN:  Are you having pain? No   PLOF: Independent  PATIENT GOALS to have less prolapse symptoms.   PERTINENT HISTORY:  endometrial ablation with postmenopausal bleeding, endometrial mass 2.5 x 1.3 cm, hysteroscopy with dilation and curettage and Myosurgical resection of endometrial mass 03/03/22 Sexual abuse: No  BOWEL MOVEMENT Pain with bowel movement: No Type of bowel movement:Type (Bristol Stool Scale) 4, Frequency daily, and Strain No Fully empty rectum: Yes:   Leakage: No Pads: No Fiber supplement: No  URINATION Pain with urination: No Fully empty bladder: Yes:   Stream: Strong and Weak Urgency: Yes:   Frequency: nighttime 1-4x; day time not quicker than every 2 hours Leakage:  not getting to toilet quick enough but that is rare Pads: No  INTERCOURSE Pain with intercourse: Initial Penetration and During Penetration Ability to have vaginal penetration:  Yes:   Climax: not painful Marinoff Scale: 0/3 Does report dryness   PREGNANCY Vaginal deliveries 2 Tearing Yes: episiotomy with first; doesn't think  she had tearing with second C-section deliveries 0 Currently pregnant No  PROLAPSE Cystocele  , Urethrocele  , and Rectocele    per pt    OBJECTIVE:   DIAGNOSTIC FINDINGS:    COGNITION:  Overall cognitive status: Within functional limits for tasks assessed     SENSATION:  Light touch: Appears intact  Proprioception: Appears intact  MUSCLE LENGTH: Bil adductors and hamstrings limited by 25%                POSTURE: No Significant postural limitations   LUMBARAROM/PROM  A/PROM A/PROM  eval  Flexion WFL  Extension WFL  Right lateral flexion Limited by 25%  Left lateral flexion Limited by 25%  Right rotation WFL  Left rotation WFL   (Blank rows = not tested)  LOWER EXTREMITY ROM:  WFL  LOWER EXTREMITY MMT:  Bil hip abduction 3+5, flexion and extension 4/5; knees and ankles 5/5   PALPATION:   General  no TTP                External Perineal Exam WFL, dryness noted at vulva                             Internal Pelvic Floor no TTP  Patient confirms identification and approves PT to assess internal pelvic floor and treatment Yes  PELVIC MMT:   MMT eval 05/14/22  08/07/22   Vaginal 3/5 improved to 4/5 with exhale coordinated with contraction; 8s isometrics, 5 reps 4/5 10s 10 reps Pt did have 5/5 holds for 3-4s and 5reps Pt deferred due to no longer having concerns or symptoms.   Internal Anal Sphincter     External Anal Sphincter     Puborectalis     Diastasis Recti     (Blank rows = not tested)        TONE: Mildly decreased  PROLAPSE: Anterior wall laxity noted in hooklying with strong cough x5  possible grade 2 based on descent of tissue; posterior wall laxity noted as well possible grade 1  05/14/22:  grade 1 with contraction with cough of anterior wall laxity lesser grade 2 without contraction of anterior wall; did not see laxity of posterior wall today   Pt has not had pessary in since last night; tested in hooklying   TODAY'S TREATMENT    08/07/2022: Squats 15# 2x10 progressed to 20# x10 Dead lifts x10 15# progressed to 20# x10 Lunges with front chest press 2x10 6# Lunge with row 10# x10 each at cables Mini jumps x10 without prolapse symptoms    PATIENT EDUCATION:  Education details: [id number] Person educated: Patient Education method: Consulting civil engineer, Media planner, Corporate treasurer cues, Verbal cues, and Handouts Education comprehension: verbalized understanding and returned demonstration   HOME EXERCISE PROGRAM: [id number]  ASSESSMENT:  CLINICAL IMPRESSION: Patient session focused on strengthening exercises with emphasis being on gym based exercises. Pt tolerated well  without any prolapse or urinary symptoms. Pt reports she feels confident and comfortable with DC today and denied additional concerns or questions for PT at this time. Pt has met all goals and understands she will need a new referral for any future PT needs.    OBJECTIVE IMPAIRMENTS decreased coordination, decreased endurance, decreased strength, impaired flexibility, improper body mechanics, and postural dysfunction.   ACTIVITY LIMITATIONS carrying, lifting, squatting, and continence  PARTICIPATION LIMITATIONS: interpersonal relationship and community activity  PERSONAL FACTORS Fitness, Time since onset of injury/illness/exacerbation, and 1 comorbidity: medical history  are also affecting patient's functional outcome.   REHAB POTENTIAL: Good  CLINICAL DECISION MAKING: Stable/uncomplicated  EVALUATION COMPLEXITY: Low   GOALS: Goals reviewed with patient? Yes  SHORT TERM GOALS: Target date: 04/15/2022  Pt to be I with HEP.  Baseline: Goal status: MET  2.  Pt will have 25% less urgency due to bladder retraining and strengthening  Baseline:  Goal status: MET  3.  Pt to demonstrate at least 4/5 pelvic floor strength for improved pelvic stability and decreased strain at pelvic floor.  Baseline:  Goal status: MET  4.  Pt to be I with voiding and  breathing mechanics to decrease strain at prolapse and pelvic floor  Baseline:  Goal status: MET    LONG TERM GOALS: Target date:  06/18/22    Pt to be I with advanced HEP.  Baseline:  Goal status: MET  2.  Pt will have 50% less urgency due to bladder retraining and strengthening  Baseline:  Goal status: MET  3.  Pt to demonstrate at least 4/5 pelvic floor strength with ability to hold contraction for 8s for improved pelvic stability and decreased strain at pelvic floor/ decrease leakage.  Baseline:  Goal status: MET  4.  Pt to demonstrate at least 5/5 bil hip strength for improved pelvic stability and functional squats without leakage.  Baseline:  Goal status: MET  5.  Pt to demonstrate improved coordination of pelvic floor and breathing mechanics for squatting at least 20# without prolapse symptoms for improved pelvic stability and decreased stress at pelvic floor.  Baseline:  Goal status: MET  6.  Pt to be I and confident with setup and good technique for leg press for 90# without cues for improved safety at gym post discharge with core and pelvic floor activation.  Baseline:  Goal status: MET    PLAN: PT FREQUENCY: 1x/week - every other week  PT DURATION: other: 5 more visits from today  PLANNED INTERVENTIONS: Therapeutic exercises, Therapeutic activity, Neuromuscular re-education, Patient/Family education, Joint mobilization, Aquatic Therapy, Dry Needling, Spinal mobilization, Cryotherapy, Moist heat, scar mobilization, Taping, Biofeedback, and Manual therapy  PLAN  FOR NEXT SESSION:   PHYSICAL THERAPY DISCHARGE SUMMARY  Visits from Start of Care: 14  Current functional level related to goals / functional outcomes: Pt denies any additional concerns   Remaining deficits: None per pt   Education / Equipment: HEP   Patient agrees to discharge. Patient goals were met. Patient is being discharged due to meeting the stated rehab goals.   Stacy Gardner, PT,  DPT 11/30/239:24 AM

## 2022-08-11 ENCOUNTER — Encounter: Payer: Self-pay | Admitting: Internal Medicine

## 2022-08-21 ENCOUNTER — Encounter: Payer: Self-pay | Admitting: *Deleted

## 2022-08-22 ENCOUNTER — Encounter: Payer: Self-pay | Admitting: Family Medicine

## 2022-08-22 ENCOUNTER — Ambulatory Visit (INDEPENDENT_AMBULATORY_CARE_PROVIDER_SITE_OTHER): Payer: BC Managed Care – PPO | Admitting: Family Medicine

## 2022-08-22 VITALS — BP 120/68 | HR 48 | Temp 97.8°F | Ht 61.0 in | Wt 117.2 lb

## 2022-08-22 DIAGNOSIS — E785 Hyperlipidemia, unspecified: Secondary | ICD-10-CM

## 2022-08-22 DIAGNOSIS — Z0000 Encounter for general adult medical examination without abnormal findings: Secondary | ICD-10-CM | POA: Diagnosis not present

## 2022-08-22 DIAGNOSIS — E139 Other specified diabetes mellitus without complications: Secondary | ICD-10-CM

## 2022-08-22 LAB — CBC WITH DIFFERENTIAL/PLATELET
Basophils Absolute: 0 10*3/uL (ref 0.0–0.1)
Basophils Relative: 0.8 % (ref 0.0–3.0)
Eosinophils Absolute: 0 10*3/uL (ref 0.0–0.7)
Eosinophils Relative: 1 % (ref 0.0–5.0)
HCT: 40.2 % (ref 36.0–46.0)
Hemoglobin: 13.7 g/dL (ref 12.0–15.0)
Lymphocytes Relative: 28.8 % (ref 12.0–46.0)
Lymphs Abs: 0.9 10*3/uL (ref 0.7–4.0)
MCHC: 34.2 g/dL (ref 30.0–36.0)
MCV: 91 fl (ref 78.0–100.0)
Monocytes Absolute: 0.4 10*3/uL (ref 0.1–1.0)
Monocytes Relative: 11.2 % (ref 3.0–12.0)
Neutro Abs: 1.8 10*3/uL (ref 1.4–7.7)
Neutrophils Relative %: 58.2 % (ref 43.0–77.0)
Platelets: 166 10*3/uL (ref 150.0–400.0)
RBC: 4.42 Mil/uL (ref 3.87–5.11)
RDW: 13 % (ref 11.5–15.5)
WBC: 3.2 10*3/uL — ABNORMAL LOW (ref 4.0–10.5)

## 2022-08-22 LAB — COMPREHENSIVE METABOLIC PANEL
ALT: 12 U/L (ref 0–35)
AST: 19 U/L (ref 0–37)
Albumin: 4.6 g/dL (ref 3.5–5.2)
Alkaline Phosphatase: 68 U/L (ref 39–117)
BUN: 7 mg/dL (ref 6–23)
CO2: 27 mEq/L (ref 19–32)
Calcium: 9.2 mg/dL (ref 8.4–10.5)
Chloride: 96 mEq/L (ref 96–112)
Creatinine, Ser: 0.49 mg/dL (ref 0.40–1.20)
GFR: 101.09 mL/min (ref >60.00)
Glucose, Bld: 110 mg/dL — ABNORMAL HIGH (ref 70–99)
Potassium: 5.1 mEq/L (ref 3.5–5.1)
Sodium: 131 mEq/L — ABNORMAL LOW (ref 135–145)
Total Bilirubin: 0.6 mg/dL (ref 0.2–1.2)
Total Protein: 6.6 g/dL (ref 6.0–8.3)

## 2022-08-22 LAB — LIPID PANEL
Cholesterol: 163 mg/dL (ref 0–200)
HDL: 86.1 mg/dL (ref >39.00)
LDL Cholesterol: 66 mg/dL (ref 0–99)
NonHDL: 76.52
Total CHOL/HDL Ratio: 2
Triglycerides: 55 mg/dL (ref 0.0–149.0)
VLDL: 11 mg/dL (ref 0.0–40.0)

## 2022-08-22 LAB — MICROALBUMIN / CREATININE URINE RATIO
Creatinine,U: 19.7 mg/dL
Microalb Creat Ratio: 3.5 mg/g (ref 0.0–30.0)
Microalb, Ur: <0.7 mg/dL (ref 0.0–1.9)

## 2022-08-22 NOTE — Patient Instructions (Addendum)
We discussed ct calcium/cardiac scoring consideration just in case even though your numbers are excellent - in case there is some plaque deposition  Please stop by lab before you go If you have mychart- we will send your results within 3 business days of [place] receiving them.  If you do not have mychart- we will call you about results within 5 business days of [place] receiving them.  *please also note that you will see labs on mychart as soon as they post. I will later go in and write notes on them- will say "notes from Dr. Yong Channel"   Recommended follow up: Return in about 1 year (around 08/23/2023) for physical or sooner if needed.Schedule b4 you leave.

## 2022-08-22 NOTE — Progress Notes (Signed)
Phone [phone]   Subjective:  Patient presents today for their annual physical. Chief complaint-noted.   See problem oriented charting- ROS- full  review of systems was completed and negative Per full ROS sheet completed by patient  The following were reviewed and entered/updated in epic: Past Medical History:  Diagnosis Date   Anemia    Chicken pox    COVID    may 2022 cold s/s cough . blood sugars out of control.   Depression    Resolved. in 107s- meds and counseling.   Diabetes mellitus ([place])    Family history of hemochromatosis    father- screen iron   Gestational diabetes 09/08/1985   a1c q3 years   Hammer toe    Lichen planus    Sees [facility] dermatology - Dr. Particia Nearing   Morton's neuroma    per records,pt.denies 10/15/21   Wears glasses    Patient Active Problem List   Diagnosis Date Noted   LADA (latent autoimmune diabetes of adulthood) ([place]) 11/23/2018    Priority: High   Hyperlipidemia 08/04/2016    Priority: Medium    Fever blister 08/04/2016    Priority: Low   Depression     Priority: Low   Family history of hemochromatosis     Priority: Low   COVID-19 virus infection 01/25/2021   Hyponatremia 10/16/2020   Past Surgical History:  Procedure Laterality Date   DILATATION & CURETTAGE/HYSTEROSCOPY WITH MYOSURE N/A 03/03/2022   Procedure: hysterscopic myomectomy dilation and curretage;  Surgeon: Nunzio Cobbs, MD;  Location: [facility];  Service: Gynecology;  Laterality: N/A;  hysterscopic myomectomy dilation and curretage   ENDOMETRIAL ABLATION  2010   heavy menstrual bleeding   TONSILLECTOMY AND ADENOIDECTOMY  1965    Family History  Problem Relation Age of Onset   Osteoporosis Mother    Depression Mother        dealing with some memory issues   Glaucoma Mother    Dementia Mother    Hemochromatosis Father    Alcoholism Father    Hearing loss Brother        otosclerosis   Hypertension Brother    Hyperlipidemia  Brother    Diabetes Brother        ? PE as well- she will verify   Hyperlipidemia Brother    Stroke Other        grandparent   Hypertension Other        grandparent   Colon cancer Neg Hx    Colon polyps Neg Hx    Esophageal cancer Neg Hx    Rectal cancer Neg Hx    Stomach cancer Neg Hx     Medications- reviewed and updated Current Outpatient Medications  Medication Sig Dispense Refill   Accu-Chek FastClix Lancets MISC Use as instructed 204 each 3   acyclovir ointment (ZOVIRAX) 5 % Apply 1 application topically every 3 (three) hours. (Patient taking differently: Apply 1 application  topically every 3 (three) hours. Not taking now    takes for fever blister) 30 g 1   BAQSIMI TWO PACK 3 MG/DOSE POWD Use as needed for hypoglycemia 1 each PRN   BD PEN NEEDLE NANO 2ND GEN 32G X 4 MM MISC USE 6-8 TIMES A DAY FOR INSULIN 500 each 3   Blood Glucose Calibration (ACCU-CHEK GUIDE CONTROL) LIQD To calibrate Accu-Chek Guide meter 1 each 11   Cholecalciferol 50 MCG (2000 UT) CAPS Take 1 capsule by mouth daily.     Continuous Blood  Gluc Sensor (DEXCOM G7 SENSOR) MISC 3 each by Does not apply route every 30 (thirty) days. Apply 1 sensor every 10 days 9 each 4   glucose blood test strip Use as instructed to check blood sugar 4 times a day - Accuchek Guide 100 each 3   Insulin Aspart, w/Niacinamide, (FIASP PENFILL) 100 UNIT/ML SOCT Use up to 20 units a day as advised 15 mL 11   Insulin Glargine (BASAGLAR KWIKPEN) 100 UNIT/ML Inject 3-4 units in a.m. and 4-5 units at bedtime under skin, as advised 30 mL 3   NOVOPEN ECHO DEVI Use as advised 1 each 3   tetrahydrozoline-zinc (VISINE-AC) 0.05-0.25 % ophthalmic solution Place 1 drop into both eyes 3 (three) times daily as needed.     triamcinolone cream (KENALOG) 0.1 % For autoimmune rash, not taking right now     vitamin B-12 (CYANOCOBALAMIN) 250 MCG tablet Take 250 mcg by mouth daily. Takes it once a week     No current facility-administered medications  for this visit.    Allergies-reviewed and updated Allergies  Allergen Reactions   Penicillins Rash   Sulfa Antibiotics Rash    Social History   Social History Narrative   Married. (husband [name] of Dr. Yong Channel). 2 children grown and married.   Son - 2 kids including oldest [place] [id number], son is 37 years old   Daughter- 63 year old granddaughter  youngest 2 in 2020 premature 2 months early (13 months old identical twin girls) 08/15/2019         Has had 95 moves in 81 years, most recently [place] for 1.5 years, [place] 5 years prior, [place] prior      B.A. In nutrition. Masters in [employer].  Has worked as a Licensed conveyancer.      Objective  Objective:  BP 120/68   Pulse (!) 48   Temp 97.8 F (36.6 C)   Ht '5\' 1"'$  (1.549 m)   Wt 117 lb 3.2 oz (53.2 kg)   LMP 09/09/2007 (Approximate) Comment: this was with her endometrial ablation  SpO2 98%   BMI 22.14 kg/m  Gen: NAD, resting comfortably HEENT: Mucous membranes are moist. Oropharynx normal Neck: no thyromegaly CV: RRR no murmurs rubs or gallops Lungs: CTAB no crackles, wheeze, rhonchi Abdomen: soft/nontender/nondistended/normal bowel sounds. No rebound or guarding.  Ext: no edema Skin: warm, dry Neuro: grossly normal, moves all extremities, PERRLA Diabetic Foot Exam - Simple   Simple Foot Form Diabetic Foot exam was performed with the following findings: Yes 08/22/2022 11:05 AM  Visual Inspection No deformities, no ulcerations, no other skin breakdown bilaterally: Yes Sensation Testing Intact to touch and monofilament testing bilaterally: Yes Pulse Check Posterior Tibialis and Dorsalis pulse intact bilaterally: Yes Comments       Assessment and Plan   61 y.o. female presenting for annual physical.  Health Maintenance counseling: 1. Anticipatory guidance: Patient counseled regarding regular dental exams -q6 months, eye exams - yearly,  avoiding smoking and second hand smoke , limiting alcohol to 1  beverage per day- 1-2 a week or less , no illicit drugs .   2. Risk factor reduction:  Advised patient of need for regular exercise and diet rich and fruits and vegetables to reduce risk of heart attack and stroke.  Exercise- still excellent - at least daily.  Diet/weight management-very healthy weight.  Wt Readings from Last 3 Encounters:  08/22/22 117 lb 3.2 oz (53.2 kg)  06/13/22 117 lb 12.8 oz (53.4 kg)  05/19/22 119 lb (  54 kg)  3. Immunizations/screenings/ancillary studies-discussed RSV- opts out, discussed COVID- declines, influenza declines Immunization History  Administered Date(s) Administered   Influenza,inj,Quad PF,6+ Mos 07/31/2015, 08/04/2016, 08/05/2017, 08/11/2018, 06/20/2019, 08/21/2021   PFIZER Comirnaty(Gray Top)Covid-19 Tri-Sucrose Vaccine 12/24/2020   PFIZER(Purple Top)SARS-COV-2 Vaccination 11/18/2019, 12/09/2019, 06/07/2020   Pneumococcal Polysaccharide-23 03/09/2018   Tdap 07/31/2015   Zoster Recombinat (Shingrix) 03/09/2018, 08/11/2018  4. Cervical cancer screening- pap smear 12/09/2021 with some atypia-close follow-up with GYN plan 5. Breast cancer screening-  breast exam with GYN typically and mammogram 10/08/2021 with 1 year repeat ideally she prefers 2  typically  6. Colon cancer screening -colonoscopy 10/29/2021 with 5 year repeat planned   7. Skin cancer screening- went this year- was told 5-8 year follow up . advised regular sunscreen use. Denies worrisome, changing, or new skin lesions.   8. Birth control/STD check- postmenopausal and monogamous  9. Osteoporosis screening at 46- discussed option of bone density before  64 as well- she prefers to wait  -Never smoker   Status of chronic or acute concerns   #social update- husband will retire November 07 2022- that will be huge for her!   #worked with DR. Quincy Simmonds-  -from 03/03/22 "Will proceed with hysteroscopy, Myosure resection of endometrial mass, dilation and curettage." Did well with this all benign - Did not  have repair of prolapse as had great success with pelvic floor PT. Also did well with pessary   #Therapy- working with therapist in relation to caregiver burden - found helpful  # LADA/insulin dependence-follows closely with endocrinology and A1c has been well-controlled.  Does need urine microalbumin creatinine ratio  #lipid discussion S: Medication: None Lab Results  Component Value Date   CHOL 149 08/21/2021   HDL 78.20 08/21/2021   LDLCALC 62 08/21/2021   TRIG 45.0 08/21/2021   CHOLHDL 2 08/21/2021   A/P: Excellent control of LDL despite no statin-she prefers to remain off medication but is aware of formal recommendations for patients with diabetes   #Mild forgetfulness- wants to monitor due to moms history. Does take b12. Stress can contribute  Recommended follow up: Return in about 1 year (around 08/23/2023) for physical or sooner if needed.Schedule b4 you leave. Future Appointments  Date Time Provider [place]  10/15/2022 10:20 AM Philemon Kingdom, MD [facility] None  12/10/2022  8:00 AM Yisroel Ramming, Everardo All, MD [facility] None   Lab/Order associations: NOT fasting- cereal with nuts   ICD-10-CM   1. Preventative health care  Z00.00 CBC with Differential/Platelet    Comprehensive metabolic panel    Lipid panel    Microalbumin / creatinine urine ratio    2. LADA (latent autoimmune diabetes of adulthood) ([place])  E13.9 Microalbumin / creatinine urine ratio    3. Hyperlipidemia, unspecified hyperlipidemia type  E78.5 CBC with Differential/Platelet    Comprehensive metabolic panel    Lipid panel      No orders of the defined types were placed in this encounter.   Return precautions advised.  Garret Reddish, MD

## 2022-09-10 ENCOUNTER — Other Ambulatory Visit: Payer: Self-pay | Admitting: Internal Medicine

## 2022-09-10 DIAGNOSIS — E139 Other specified diabetes mellitus without complications: Secondary | ICD-10-CM

## 2022-09-11 ENCOUNTER — Other Ambulatory Visit: Payer: Self-pay | Admitting: Internal Medicine

## 2022-09-11 DIAGNOSIS — E139 Other specified diabetes mellitus without complications: Secondary | ICD-10-CM

## 2022-09-15 ENCOUNTER — Other Ambulatory Visit (HOSPITAL_COMMUNITY): Payer: Self-pay

## 2022-09-15 NOTE — Telephone Encounter (Signed)
Running a test claim gives [place] a too soon on a fill since patient just picked up Brittany Barton. But it does not show any indication of requiring a PA when needed. Please contact us if the patient has any issues when trying to pick up Lantus Solostar in the future. Thank you!

## 2022-10-09 DIAGNOSIS — U071 COVID-19: Secondary | ICD-10-CM

## 2022-10-09 HISTORY — DX: COVID-19: U07.1

## 2022-10-15 ENCOUNTER — Encounter: Payer: Self-pay | Admitting: Internal Medicine

## 2022-10-15 ENCOUNTER — Ambulatory Visit: Payer: BC Managed Care – PPO | Admitting: Internal Medicine

## 2022-10-15 VITALS — BP 120/70 | HR 62 | Ht 61.0 in | Wt 120.0 lb

## 2022-10-15 DIAGNOSIS — E139 Other specified diabetes mellitus without complications: Secondary | ICD-10-CM

## 2022-10-15 DIAGNOSIS — E785 Hyperlipidemia, unspecified: Secondary | ICD-10-CM

## 2022-10-15 DIAGNOSIS — E871 Hypo-osmolality and hyponatremia: Secondary | ICD-10-CM | POA: Diagnosis not present

## 2022-10-15 LAB — POCT GLYCOSYLATED HEMOGLOBIN (HGB A1C): Hemoglobin A1C: 6 % — AB (ref 4.0–5.6)

## 2022-10-15 NOTE — Progress Notes (Signed)
Patient ID: Brittany Barton, female   DOB: 11/10/1960, 62 y.o.   MRN: [id number]   HPI: Brittany Barton is a 62 y.o.-year-old female, initially referred by her PCP, Dr. 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  Last visit 4 months ago.  Interim history: No increased urination, blurry vision, nausea, chest pain. Her husband will retire soon and they plan to travel -she would like instructions about how to transport the insulin and her supplies during travel.  Reviewed HbA1c levels: Lab Results  Component Value Date   HGBA1C 5.9 (A) 06/13/2022   HGBA1C 5.6 02/10/2022   HGBA1C 6.1 (A) 10/21/2021   HGBA1C 5.7 (A) 06/18/2021   HGBA1C 5.8 (A) 02/19/2021   HGBA1C 5.9 (A) 10/16/2020   HGBA1C 5.8 (A) 06/12/2020   HGBA1C 5.7 (A) 02/08/2020   HGBA1C 5.7 (A) 10/20/2019   HGBA1C 5.6 06/20/2019   She is on: - Basaglar- added 10/2019: 4-6 ...>>  7-8 units at bedtime >> ... >> 3-4 units in am and 3-5 >> 4-5 units at night - FiAsp 1:22-30 >> 1:20--30 (always use the 1:30 if going for a walk around dinnertime) - uses Chronometer appt for carb counting Inject FiAsp 10-15 min before coffee. Previously on Humalog and Lyumjev.  She checks her sugars more than 4 times a day with her Dexcom CGM:   Prev.:   Previously:  Lowest sugar was 40 >> ...  50s >> 60 >> 52 ; she has hypoglycemia awareness at 61.  She keeps glucose tablets on the nightstand and juice in the fridge. Highest sugar was 350 (COVID infection) >> ... 200s >> 200s >> 330.  Glucometer: Accuchek   She continues on a plant-based diet without high glycemic index foods. In 02/2015 she started a plant-based diet and lost more than 80 pounds.  Pt's meals are: - Breakfast: Raw/soaked oatmeal, blueberries or other fruit, ground flaxseed, walnuts (2 tablespoons), soy milk - Lunch: Beans, whole grains, leafy  greens, other vegetables and fruit - Dinner: Salad with beans/peas or vegetable-based soup. - Snacks: 0-2-fruit, veggies, homemade bean dip, PB  -No CKD, last BUN/creatinine:  Lab Results  Component Value Date   BUN 7 08/22/2022   BUN 8 03/03/2022   CREATININE 0.49 08/22/2022   CREATININE 0.44 03/03/2022  Not on ACE inhibitor/ARB.  -+ History of HL, diet controlled; last set of lipids: Lab Results  Component Value Date   CHOL 163 08/22/2022   HDL 86.10 08/22/2022   LDLCALC 66 08/22/2022   TRIG 55.0 08/22/2022   CHOLHDL 2 08/22/2022  No statins.  - last eye exam was 08/05/2022: No DR  - NO numbness and tingling in her feet. Last foot exam: 08/22/2022.  Pt has FH of DM in MGM.  Hyponatremia: She has a history of low sodium: Lab Results  Component Value Date   NA 131 (L) 08/22/2022   NA 131 (L) 03/03/2022   NA 129 (L) 08/21/2021   NA 133 (L) 02/19/2021   NA 130 (L) 08/16/2020   NA 126 (L) 08/29/2019   NA 126 (L) 08/15/2019   NA 130 (L) 03/17/2019   NA 133 (L) 11/08/2018   NA 129 (L) 08/04/2018   NA 134 (L) 08/06/2017   NA 134 (L) 07/29/2016   NA 136 07/31/2015   NA 139 05/24/2013   Investigation for hyponatremia reviewed: Component     Latest Ref Rng & Units  08/16/2020  Glucose     65 - 99 mg/dL 60 (L)  BUN     7 - 25 mg/dL 8  Creatinine     0.50 - 0.99 mg/dL 0.50  GFR, Est Non African American     > OR = 60 mL/min/1.87m 105  Sodium     135 - 146 mmol/L 130 (L)  Potassium     3.5 - 5.3 mmol/L 4.6  Chloride     98 - 110 mmol/L 94 (L)  Osmolality, Urine     50 - 1,200 mOsm/kg 142  Osmolality     278 - 305 mOsm/kg 268 (L)  Sodium, Urine     28 - 272 mmol/L 33   Plasma sodium was 130, slightly low >> hyponatremia Plasma osmolality was 268, lower than 275-280 >> hypoosmolar hyponatremia Urine sodium was 33, not very low >> ruling out excessive fluid losses (chronic diuretics, GI losses, excessive perspiration) or water overload Urine osmolality was  142, higher than 100 >> inappropriately high for hyponatremia  We ruled out adrenal insufficiency by a normal cosyntropin stimulation test: Component     Latest Ref Rng & Units 11/30/2018 11/30/2018 11/30/2018         8:15 AM  8:59 AM  9:26 AM  Cortisol, Plasma     ug/dL 16.8 24.2 28.0  C206 ACTH     6 - 50 pg/mL 15     Also, she does not have a history of hypothyroidism: Lab Results  Component Value Date   TSH 1.07 11/08/2018   TSH 1.36 07/29/2016   TSH 2.41 07/31/2015   She is not on HCTZ, carbamazepine, amitriptyline, SSRIs, opiates. No history of brain injury. No history of generalized infections. No history of malignancy. No recent brain or chest imaging. No chronic kidney disease.  At previous visits, I advised her to try to reduce her fluid intake to 1 L a day.  She was able to limit her intake to 1.5 L a day, but this was difficult for her to do. She had a lot of fluid retention during her COVID-19 episode from 01/2021 and after taking the antiviral medication she eliminated this (approximately 7 to 8 pounds).  Mother also has a history of hyponatremia. Brother with otosclerosis.  ROS: + See HPI  I reviewed pt's medications, allergies, PMH, social hx, family hx, and changes were documented in the history of present illness. Otherwise, unchanged from my initial visit note.  Past Medical History:  Diagnosis Date   Anemia    Chicken pox    COVID    may 2022 cold s/s cough . blood sugars out of control.   Depression    Resolved. in 430s meds and counseling.   Diabetes mellitus ([place]    Family history of hemochromatosis    father- screen iron   Gestational diabetes 09/08/1985   a1c q3 years   Hammer toe    Lichen planus    [facility]  Morton's neuroma    per records,pt.denies 10/15/21   Wears glasses    Past Surgical History:  Procedure Laterality Date   DILATATION & CURETTAGE/HYSTEROSCOPY WITH MYOSURE N/A 03/03/2022   Procedure:  hysterscopic myomectomy dilation and curretage;  Surgeon: ANunzio Cobbs MD;  [facility]  Service: Gynecology;  Laterality: N/A;  hysterscopic myomectomy dilation and curretage   ENDOMETRIAL ABLATION  2010   heavy menstrual bleeding   TONSILLECTOMY AND ADENOIDECTOMY  1965  Social History   Socioeconomic History   Marital status: Married    Spouse name: Not on file   Number of children: 2   Years of education: Not on file   Highest education level: Not on file  Occupational History    Librarian  Social Needs   Financial resource strain: Not on file   Food insecurity:    Worry: Not on file    Inability: Not on file   Transportation needs:    Medical: Not on file    Non-medical: Not on file  Tobacco Use   Smoking status: Never Smoker   Smokeless tobacco: Never Used  Substance and Sexual Activity   Alcohol use: Yes    Alcohol/week:  2-4 drinks per week    Types:  Wine/beer   Drug use: No   Sexual activity: Yes  Lifestyle   Physical activity: Walk/hike/spin class/weight resistance/stretching    Days per week: 7    Minutes per session: Not on file  Relationships  Social History Narrative   Married. (husband Jeneen Rinks, [name]). 2 children grown and married. B/c grandmother December 2016.    Has had 42 moves in 18 years, most recently [place] for 1.5 years, [place] 5 years prior, [place] prior      [employer]. In nutrition. Masters in [employer]. In between jobs as Licensed conveyancer.       Current Outpatient Medications on File Prior to Visit  Medication Sig Dispense Refill   Accu-Chek FastClix Lancets MISC Use as instructed 204 each 3   acyclovir ointment (ZOVIRAX) 5 % Apply 1 application topically every 3 (three) hours. (Patient taking differently: Apply 1 application  topically every 3 (three) hours. Not taking now    takes for fever blister) 30 g 1   BAQSIMI TWO PACK 3 MG/DOSE POWD Use as needed for hypoglycemia 1 each PRN    BD PEN NEEDLE NANO 2ND GEN 32G X 4 MM MISC USE 6-8 TIMES A DAY FOR INSULIN 500 each 3   Blood Glucose Calibration (ACCU-CHEK GUIDE CONTROL) LIQD To calibrate Accu-Chek Guide meter 1 each 11   Cholecalciferol 50 MCG (2000 UT) CAPS Take 1 capsule by mouth daily.     Continuous Blood Gluc Sensor (DEXCOM G7 SENSOR) MISC 3 each by Does not apply route every 30 (thirty) days. Apply 1 sensor every 10 days 9 each 4   glucose blood test strip Use as instructed to check blood sugar 4 times a day - Accuchek Guide 100 each 3   Insulin Aspart, w/Niacinamide, (FIASP PENFILL) 100 UNIT/ML SOCT USE UP TO 20 UNITS A DAY AS ADVISED 15 mL 11   insulin glargine (LANTUS SOLOSTAR) 100 UNIT/ML Solostar Pen Check on the skin up to 10 units daily, as advised.  To replace Basaglar. 30 mL 3   NOVOPEN ECHO DEVI Use as advised 1 each 3   tetrahydrozoline-zinc (VISINE-AC) 0.05-0.25 % ophthalmic solution Place 1 drop into both eyes 3 (three) times daily as needed.     triamcinolone cream (KENALOG) 0.1 % For autoimmune rash, not taking right now     vitamin B-12 (CYANOCOBALAMIN) 250 MCG tablet Take 250 mcg by mouth daily. Takes it once a week     No current facility-administered medications on file prior to visit.   Allergies  Allergen Reactions   Penicillins Rash   Sulfa Antibiotics Rash   Family History  Problem Relation Age of Onset   Osteoporosis Mother    Depression Mother  dealing with some memory issues   Glaucoma Mother    Dementia Mother    Hemochromatosis Father    Alcoholism Father    Hearing loss Brother        otosclerosis   Hypertension Brother    Hyperlipidemia Brother    Diabetes Brother        ? PE as well- she will verify   Hyperlipidemia Brother    Stroke Other        grandparent   Hypertension Other        grandparent   Colon cancer Neg Hx    Colon polyps Neg Hx    Esophageal cancer Neg Hx    Rectal cancer Neg Hx    Stomach cancer Neg Hx    PE: BP 120/70 (BP Location: Right  Arm, Patient Position: Sitting, Cuff Size: Normal)   Pulse 62   Ht '5\' 1"'$  (1.549 m)   Wt 120 lb (54.4 kg)   LMP 09/09/2007 (Approximate) Comment: this was with her endometrial ablation  SpO2 96%   BMI 22.67 kg/m  Wt Readings from Last 3 Encounters:  10/15/22 120 lb (54.4 kg)  08/22/22 117 lb 3.2 oz (53.2 kg)  06/13/22 117 lb 12.8 oz (53.4 kg)   Constitutional: normal weight, in NAD Eyes:  EOMI, no exophthalmos ENT: no neck masses, no cervical lymphadenopathy Cardiovascular: RRR, No MRG Respiratory: CTA B Musculoskeletal: no deformities Skin:no rashes Neurological: no tremor with outstretched hands  ASSESSMENT: 1. LADA, insulin-dependent, controlled, without long-term complications but with occasional hyperglycemia  She had low C-peptide and high GAD antibodies: Component     Latest Ref Rng & Units 03/17/2019  Glucose     65 - 99 mg/dL 192 (H)  C-Peptide     0.80 - 3.85 ng/mL 0.55 (L)   Component     Latest Ref Rng & Units 11/08/2018  Glucose     70 - 99 mg/dL 130 (H)  C-Peptide     0.80 - 3.85 ng/mL 0.55 (L)  Glutamic Acid Decarb Ab     <5 IU/mL >250 (H)  Insulin Antibodies, Human     <0.4 U/mL 3.6 (H)   Component     Latest Ref Rng & Units 11/30/2018  ZNT8 Antibodies     U/mL <15  Islet Cell Ab     Neg:<1:1 Negative   2. HL  3. Hyponatremia  PLAN:  1. Patient with history of LADA, diagnosed in 2020, based on positive GAD antibodies and decreased C-peptide.  She continues on a plant-based diet, with low glycemic index foods, and she exercises consistently.  She was able to avoid starting insulin for a long time but ended up adding mealtime insulin in 2020 and long-acting insulin in 2021.  She is very insulin sensitive and we are using very low doses of insulin for her.  She continues on Careers information officer, but per insurance preference we will need to change to Lantus.  She is doing better on a twice a day Basaglar regimen. -At last visit, sugars appears to be fairly  well-controlled overnight but they were increasing in the early morning hours.  I advised her to slightly increase the Basaglar dose at night.  I also gave her a formal sliding scale.  I also advised her to watch for low blood sugars especially if she was walking at night with her husband, which she occasionally did.  She normally exercised in the morning, with subsequent lower sugars, but not to hypoglycemia range.   -  At last check I sent a prescription for the Dexcom G7 to her pharmacy.  She also inquired about the OmniPod pump.  We discussed about the more intense supervision of her diabetes and the fact that she requires very little insulin which may not be able to be delivered via insulin pump.  We decided to hold off on this at the time. CGM interpretation: -At today's visit, we reviewed her CGM downloads: It appears that 85% of values are in target range (goal >70%), while 13% are higher than 180 (goal <25%), and 2% are lower than 70 (goal <4%).  The calculated average blood sugar is 129.  The projected HbA1c for the next 3 months (GMI) is 6.4%. -Reviewing the CGM trends, sugars appear to be fluctuating mostly in the target range, with few hyperglycemic spikes.  In the last 2 weeks, sugars appear to be improved compared to the previous 2 weeks.  She does mention that sugars were higher during the holidays and they improved that she is even more conscientious with her diet and exercise.  She did have several instances in which her sugars dropped overnight after exercising in the evening, so we discussed that the Endo situations, she may need to take a lower dose of Lantus at bedtime but she may also need to be very lax with the correction of blood sugars after dinner.  She is not usually correcting after dinner.  Otherwise, she is doing a wonderful job adjusting the dose of Fiasp based on the number of carbs, amount of fat in her meal, CGM trends.  I did not suggest changes in her bolus regimen. -We  discussed about traveling with insulin and I suggested an insulated pouch.  She should not have problems going through security with her diabetic supplies or insulin but she prefers to have a letter from me mentioning that she needs to have them with her all the time.  She will let me know when she needs the letter and it will be no problem to put this together for her. - I advised her to: Patient Instructions  Please continue: - Basaglar 4 units in am and 3 (when more active)-5 units at bedtime - FiAsp: insulin to carb ratio 1:20-30 (always use the 1:30 if going for a walk around dinnertime)  Use the following sliding scale:  Target: 150 Correction factor: 100 (at night, try to use only 1 unit for correction)  Please return in 4 months.  - we checked her HbA1c: 6.0% (excellent) - advised to check sugars at different times of the day - 4x a day, rotating check times - advised for yearly eye exams >> she is UTD - return to clinic in 4 months  2. HL -Reviewed latest lipid panel from 08/2022: All fractions at goal: Lab Results  Component Value Date   CHOL 163 08/22/2022   HDL 86.10 08/22/2022   LDLCALC 66 08/22/2022   TRIG 55.0 08/22/2022   CHOLHDL 2 08/22/2022  -She is not on a statin but has excellent lipid levels on the plant-based diet  3.  Hyponatremia -She is asymptomatic: no headaches, mental, confusion -Her sodium usually fluctuates between 126 and 133-mild hyponatremia.  Latest level was reviewed from last visit with PCP and this was slightly low, at 131, stable. 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 -After fluid restriction, if sodium level = normal >> most likely SIADH.  If sodium = still low >> possible reset osmostat.  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  Philemon Kingdom, MD PhD [facility] Endocrinology

## 2022-10-15 NOTE — Patient Instructions (Addendum)
Please continue: - Basaglar 4 units in am and 3 (when more active)-5 units at bedtime - FiAsp: insulin to carb ratio 1:20-30 (always use the 1:30 if going for a walk around dinnertime)  Use the following sliding scale:  Target: 150 Correction factor: 100 (at night, try to use only 1 unit for correction)  Please return in 4 months.

## 2022-11-14 ENCOUNTER — Encounter: Payer: Self-pay | Admitting: Family

## 2022-11-14 ENCOUNTER — Ambulatory Visit: Payer: BC Managed Care – PPO | Admitting: Family

## 2022-11-14 VITALS — BP 122/77 | HR 62 | Temp 97.3°F | Ht 61.0 in | Wt 120.0 lb

## 2022-11-14 DIAGNOSIS — U071 COVID-19: Secondary | ICD-10-CM

## 2022-11-14 LAB — POC COVID19 BINAXNOW: SARS Coronavirus 2 Ag: POSITIVE — AB

## 2022-11-14 LAB — POCT RAPID STREP A (OFFICE): Rapid Strep A Screen: NEGATIVE

## 2022-11-14 MED ORDER — NIRMATRELVIR/RITONAVIR (PAXLOVID)TABLET: 3.0000 | ORAL_TABLET | Freq: Two times a day (BID) | ORAL | 0 refills | Status: AC

## 2022-11-14 NOTE — Patient Instructions (Signed)
It was very nice to see you today!   I have sent the Paxlovid to your pharmacy. Start this today. OK to continue taking any OTC sinus or pain reliever meds with this. Drink at least 2 liters of water daily. Let us know if you are not feeling better or if you have to stop the medication due to side effects.       PLEASE NOTE:  If you had any lab tests please let us know if you have not heard back within a few days. You may see your results on MyChart before we have a chance to review them but we will give you a call once they are reviewed by [place]. If we ordered any referrals today, please let us know if you have not heard from their office within the next week.

## 2022-11-14 NOTE — Progress Notes (Signed)
Patient ID: Brittany Barton, female    DOB: [birth year], 62 y.o.   MRN: [id number]  Chief Complaint  Patient presents with   Covid Exposure    sx started today    HPI:      URI sx:   Pt c/o headache,congestion, body aches, cough, sore throat and chills. Present since this morning. Husband diagnosed Tuesday with covid. Pt has taken Molnupiravir in past but had SE, had been advised to avoid Paxlovid at the time due to her T1DM and renal effects.  Assessment & Plan:  1. COVID-19 rapid strep neg. Sending Paxlovid pt advised how to take, & SE. Advised Molnupiravir is not effective for this covid strain. Pt renal fx is excellent. Advised of CDC guidelines for masking if out in public. OK to continue taking OTC sinus or pain meds. Encouraged to monitor & notify office of any worsening symptoms: increased shortness of breath, weakness, and signs of dehydration. Instructed to rest and hydrate well.   - POCT rapid strep A - POC COVID-19 - nirmatrelvir/ritonavir (PAXLOVID) 20 x 150 MG & 10 x '100MG'$  TABS; Take 3 tablets by mouth 2 (two) times daily for 5 days. (Take nirmatrelvir 150 mg two tablets twice daily for 5 days and ritonavir 100 mg one tablet twice daily for 5 days)  Dispense: 30 tablet; Refill: 0   Subjective:    Outpatient Medications Prior to Visit  Medication Sig Dispense Refill   Accu-Chek FastClix Lancets MISC Use as instructed 204 each 3   acyclovir ointment (ZOVIRAX) 5 % Apply 1 application topically every 3 (three) hours. (Patient taking differently: Apply 1 application  topically every 3 (three) hours. Not taking now    takes for fever blister) 30 g 1   BAQSIMI TWO PACK 3 MG/DOSE POWD Use as needed for hypoglycemia 1 each PRN   BD PEN NEEDLE NANO 2ND GEN 32G X 4 MM MISC USE 6-8 TIMES A DAY FOR INSULIN 500 each 3   Blood Glucose Calibration (ACCU-CHEK GUIDE CONTROL) LIQD To calibrate Accu-Chek Guide meter 1 each 11   Cholecalciferol 50 MCG (2000 UT) CAPS Take 1 capsule by mouth  daily.     Continuous Blood Gluc Sensor (DEXCOM G7 SENSOR) MISC 3 each by Does not apply route every 30 (thirty) days. Apply 1 sensor every 10 days 9 each 4   glucose blood test strip Use as instructed to check blood sugar 4 times a day - Accuchek Guide 100 each 3   Insulin Aspart, w/Niacinamide, (FIASP PENFILL) 100 UNIT/ML SOCT USE UP TO 20 UNITS A DAY AS ADVISED 15 mL 11   insulin glargine (LANTUS SOLOSTAR) 100 UNIT/ML Solostar Pen Check on the skin up to 10 units daily, as advised.  To replace Basaglar. 30 mL 3   NOVOPEN ECHO DEVI Use as advised 1 each 3   tetrahydrozoline-zinc (VISINE-AC) 0.05-0.25 % ophthalmic solution Place 1 drop into both eyes 3 (three) times daily as needed.     triamcinolone cream (KENALOG) 0.1 % For autoimmune rash, not taking right now     vitamin B-12 (CYANOCOBALAMIN) 250 MCG tablet Take 250 mcg by mouth daily. Takes it once a week     No facility-administered medications prior to visit.   Past Medical History:  Diagnosis Date   Anemia    Chicken pox    COVID    may 2022 cold s/s cough . blood sugars out of control.   Depression    Resolved. in 67s- meds and counseling.  Diabetes mellitus ([place])    Family history of hemochromatosis    father- screen iron   Gestational diabetes 09/08/1985   a1c q3 years   Hammer toe    Lichen planus    Sees [facility] dermatology - Dr. Particia Nearing   Morton's neuroma    per records,pt.denies 10/15/21   Wears glasses    Past Surgical History:  Procedure Laterality Date   DILATATION & CURETTAGE/HYSTEROSCOPY WITH MYOSURE N/A 03/03/2022   Procedure: hysterscopic myomectomy dilation and curretage;  Surgeon: Nunzio Cobbs, MD;  [facility];  Service: Gynecology;  Laterality: N/A;  hysterscopic myomectomy dilation and curretage   ENDOMETRIAL ABLATION  2010   heavy menstrual bleeding   TONSILLECTOMY AND ADENOIDECTOMY  1965   Allergies  Allergen Reactions   Penicillins Rash   Sulfa Antibiotics  Rash      Objective:    Physical Exam Vitals and nursing note reviewed.  Constitutional:      Appearance: Normal appearance. She is ill-appearing.     Interventions: Face mask in place.  HENT:     Right Ear: Tympanic membrane and ear canal normal.     Left Ear: Tympanic membrane and ear canal normal.     Nose:     Right Sinus: No frontal sinus tenderness.     Left Sinus: No frontal sinus tenderness.     Mouth/Throat:     Mouth: Mucous membranes are moist.     Pharynx: Posterior oropharyngeal erythema present. No pharyngeal swelling, oropharyngeal exudate or uvula swelling.     Tonsils: No tonsillar exudate or tonsillar abscesses.  Cardiovascular:     Rate and Rhythm: Normal rate and regular rhythm.  Pulmonary:     Effort: Pulmonary effort is normal.     Breath sounds: Normal breath sounds.  Musculoskeletal:        General: Normal range of motion.  Lymphadenopathy:     Head:     Right side of head: No preauricular or posterior auricular adenopathy.     Left side of head: No preauricular or posterior auricular adenopathy.     Cervical: No cervical adenopathy.  Skin:    General: Skin is warm and dry.  Neurological:     Mental Status: She is alert.  Psychiatric:        Mood and Affect: Mood normal.        Behavior: Behavior normal.    BP 122/77 (BP Location: Left Arm, Patient Position: Sitting, Cuff Size: Normal)   Pulse 62   Temp (!) 97.3 F (36.3 C) (Temporal)   Ht '5\' 1"'$  (1.549 m)   Wt 120 lb (54.4 kg)   LMP 09/09/2007 (Approximate) Comment: this was with her endometrial ablation  SpO2 100%   BMI 22.67 kg/m  Wt Readings from Last 3 Encounters:  11/14/22 120 lb (54.4 kg)  10/15/22 120 lb (54.4 kg)  08/22/22 117 lb 3.2 oz (53.2 kg)       Jeanie Sewer, NP

## 2022-11-26 NOTE — Progress Notes (Signed)
62 y.o. G87P0 Married Caucasian female here for annual exam.    Pelvic floor therapy was helpful Incontinence has stopped. Prolapse is being managed well.  She is using her smaller pessary instead of the larger one.   No vaginal bleeding or spotting.   Had Covid in February/March.   Parents are declining and husband has retired.  PCP:   Garret Reddish, MD  Patient's last menstrual period was 09/09/2007 (approximate).           Sexually active: Yes.    The current method of family planning is post menopausal status/ablation.    Exercising: Yes.     Hiking, walking, weight resistance, stretching, dancing Smoker:  no  Health Maintenance: Pap:  12/09/21 ASCUS, squamous atypia, neg HR HPV.   Colposcopy:  ECC benign, biopsy LGSIL. 08-19-18 Neg:Neg HR HPV, 08-05-17 Neg, 07-11-15 Neg  History of abnormal Pap:  no MMG:  10/08/21 Breast Density Category B, BI-RADS CATEGORY 1 neg.  She declines yearly mammogram. Colonoscopy:  10/29/21 - polyps - due in 2028 BMD:   n/a  Result  n/a TDaP:  07/31/15 Gardasil:   no HIV: n/a Hep C: n/a Screening Labs:  PCP   reports that she has never smoked. She has never used smokeless tobacco. She reports current alcohol use. She reports that she does not use drugs.  Past Medical History:  Diagnosis Date   Anemia    Chicken pox    COVID    may 2022 cold s/s cough . blood sugars out of control.   COVID 10/2022   Depression    Resolved. in 94s- meds and counseling.   Diabetes mellitus    Family history of hemochromatosis    father- screen iron   Gestational diabetes 09/08/1985   a1c q3 years   Hammer toe    Lichen planus    Sees [facility] dermatology - Dr. Particia Nearing   Morton's neuroma    per records,pt.denies 10/15/21   Wears glasses     Past Surgical History:  Procedure Laterality Date   DILATATION & CURETTAGE/HYSTEROSCOPY WITH MYOSURE N/A 03/03/2022   Procedure: hysterscopic myomectomy dilation and curretage;  Surgeon: Nunzio Cobbs,  MD;  Location: [facility];  Service: Gynecology;  Laterality: N/A;  hysterscopic myomectomy dilation and curretage   ENDOMETRIAL ABLATION  2010   heavy menstrual bleeding   TONSILLECTOMY AND ADENOIDECTOMY  1965    Current Outpatient Medications  Medication Sig Dispense Refill   Accu-Chek FastClix Lancets MISC Use as instructed 204 each 3   BD PEN NEEDLE NANO 2ND GEN 32G X 4 MM MISC USE 6-8 TIMES A DAY FOR INSULIN 500 each 3   Blood Glucose Calibration (ACCU-CHEK GUIDE CONTROL) LIQD To calibrate Accu-Chek Guide meter 1 each 11   Cholecalciferol 50 MCG (2000 UT) CAPS Take 1 capsule by mouth daily.     Continuous Blood Gluc Sensor (DEXCOM G7 SENSOR) MISC 3 each by Does not apply route every 30 (thirty) days. Apply 1 sensor every 10 days 9 each 4   glucose blood test strip Use as instructed to check blood sugar 4 times a day - Accuchek Guide 100 each 3   Insulin Aspart, w/Niacinamide, (FIASP PENFILL) 100 UNIT/ML SOCT USE UP TO 20 UNITS A DAY AS ADVISED 15 mL 11   insulin glargine (LANTUS SOLOSTAR) 100 UNIT/ML Solostar Pen Check on the skin up to 10 units daily, as advised.  To replace Basaglar. 30 mL 3   NOVOPEN ECHO DEVI Use as  advised 1 each 3   tetrahydrozoline-zinc (VISINE-AC) 0.05-0.25 % ophthalmic solution Place 1 drop into both eyes 3 (three) times daily as needed.     triamcinolone cream (KENALOG) 0.1 % For autoimmune rash, not taking right now     vitamin B-12 (CYANOCOBALAMIN) 250 MCG tablet Take 250 mcg by mouth daily. Takes it once a week     acyclovir ointment (ZOVIRAX) 5 % Apply 1 application topically every 3 (three) hours. (Patient not taking: Reported on 12/10/2022) 30 g 1   BAQSIMI TWO PACK 3 MG/DOSE POWD Use as needed for hypoglycemia (Patient not taking: Reported on 12/10/2022) 1 each PRN   No current facility-administered medications for this visit.    Family History  Problem Relation Age of Onset   Osteoporosis Mother    Depression Mother        dealing with  some memory issues   Glaucoma Mother    Dementia Mother    Hemochromatosis Father    Alcoholism Father    Hearing loss Brother        otosclerosis   Hypertension Brother    Hyperlipidemia Brother    Diabetes Brother        ? PE as well- she will verify   Hyperlipidemia Brother    Stroke Other        grandparent   Hypertension Other        grandparent   Colon cancer Neg Hx    Colon polyps Neg Hx    Esophageal cancer Neg Hx    Rectal cancer Neg Hx    Stomach cancer Neg Hx     Review of Systems  All other systems reviewed and are negative.   Exam:   BP 112/68 (BP Location: Left Arm, Patient Position: Sitting, Cuff Size: Normal)   Pulse 78   Ht 5' 0.5" (1.537 m)   Wt 117 lb (53.1 kg)   LMP 09/09/2007 (Approximate) Comment: this was with her endometrial ablation  SpO2 99%   BMI 22.47 kg/m     General appearance: alert, cooperative and appears stated age Head: normocephalic, without obvious abnormality, atraumatic Neck: no adenopathy, supple, symmetrical, trachea midline and thyroid normal to inspection and palpation Lungs: clear to auscultation bilaterally Breasts: normal appearance, no masses or tenderness, No nipple retraction or dimpling, No nipple discharge or bleeding, No axillary adenopathy Heart: regular rate and rhythm Abdomen: soft, non-tender; no masses, no organomegaly Extremities: extremities normal, atraumatic, no cyanosis or edema Skin: skin color, texture, turgor normal. No rashes or lesions Lymph nodes: cervical, supraclavicular, and axillary nodes normal. Neurologic: grossly normal  Pelvic: External genitalia:  no lesions              No abnormal inguinal nodes palpated.              Urethra:  normal appearing urethra with no masses, tenderness or lesions              Bartholins and Skenes: normal                 Vagina: normal appearing vagina with normal color and discharge, no lesions.  First degree uterine prolapse.               Cervix: no  lesions              Pap taken: yes Bimanual Exam:  Uterus:  normal size, contour, position, consistency, mobility, non-tender  Adnexa: no mass, fullness, tenderness              Rectal exam: yes.  Confirms.              Anus:  normal sphincter tone, no lesions  Chaperone was present for exam:  Oneal Deputy., CMA  Assessment:   Well woman visit with gynecologic exam. LGSIL of cervix. Status post hysteroscopy with Myosure resection of fibroid. Incomplete uterovaginal prolapse.  Using a pessary ring with support. Hx endometrial ablation.  DM.  On insulin.   Plan: Mammogram screening discussed.  We reviewed guidelines for mammogram screening.  Self breast awareness reviewed. Pap and HR HPV collected. Guidelines for Calcium, Vitamin D, regular exercise program including cardiovascular and weight bearing exercise.   Follow up annually and prn.   After visit summary provided.

## 2022-12-10 ENCOUNTER — Encounter: Payer: Self-pay | Admitting: Obstetrics and Gynecology

## 2022-12-10 ENCOUNTER — Ambulatory Visit (INDEPENDENT_AMBULATORY_CARE_PROVIDER_SITE_OTHER): Payer: BC Managed Care – PPO | Admitting: Obstetrics and Gynecology

## 2022-12-10 ENCOUNTER — Other Ambulatory Visit (HOSPITAL_COMMUNITY)
Admission: RE | Admit: 2022-12-10 | Discharge: 2022-12-10 | Disposition: A | Discharge status: Home / Self Care | Payer: BC Managed Care – PPO | Source: Ambulatory Visit | Attending: Obstetrics and Gynecology | Admitting: Obstetrics and Gynecology

## 2022-12-10 VITALS — BP 112/68 | HR 78 | Ht 60.5 in | Wt 117.0 lb

## 2022-12-10 DIAGNOSIS — N870 Mild cervical dysplasia: Secondary | ICD-10-CM | POA: Diagnosis present

## 2022-12-10 DIAGNOSIS — Z01419 Encounter for gynecological examination (general) (routine) without abnormal findings: Secondary | ICD-10-CM

## 2022-12-10 NOTE — Patient Instructions (Signed)

## 2022-12-12 ENCOUNTER — Ambulatory Visit: Payer: BC Managed Care – PPO | Admitting: Internal Medicine

## 2022-12-15 LAB — CYTOLOGY - PAP
Comment: NEGATIVE
Diagnosis: NEGATIVE
High risk HPV: NEGATIVE

## 2023-02-13 LAB — HM DIABETES EYE EXAM

## 2023-03-02 ENCOUNTER — Other Ambulatory Visit: Payer: Self-pay | Admitting: Internal Medicine

## 2023-03-03 NOTE — Progress Notes (Unsigned)
Patient ID: Brittany Barton, female   DOB: 03-18-1961, 62 y.o.   MRN: [id number]   HPI: Brittany Barton is a 62 y.o.-year-old female, initially referred by her PCP, Dr. 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  Last visit 4 months ago.  Interim history: No increased urination, blurry vision, nausea, chest pain. Her husband will retire soon and planning to travel. She had COVID-19 again in 11/2022.  Reviewed HbA1c levels: Lab Results  Component Value Date   HGBA1C 6.0 (A) 10/15/2022   HGBA1C 5.9 (A) 06/13/2022   HGBA1C 5.6 02/10/2022   HGBA1C 6.1 (A) 10/21/2021   HGBA1C 5.7 (A) 06/18/2021   HGBA1C 5.8 (A) 02/19/2021   HGBA1C 5.9 (A) 10/16/2020   HGBA1C 5.8 (A) 06/12/2020   HGBA1C 5.7 (A) 02/08/2020   HGBA1C 5.7 (A) 10/20/2019   She is on: - Basaglar- added 10/2019: 4-6 ...>>  7-8 units at bedtime >> ... >> 3-4 units in am and 3-5 >> 4-5 >> 3 (when more active)-4 units at night - FiAsp 1:22-30 >> 1:20--30 (always use the 1:30 if going for a walk around dinnertime) - uses Chronometer appt for carb counting Inject FiAsp 10-15 min before coffee. Previously on Humalog and Lyumjev.  She checks her sugars more than 4 times a day with her Dexcom CGM:  Previously:   Prev.:  Lowest sugar was 40 >> ...   52 ; she has hypoglycemia awareness at 70.  She keeps glucose tablets on the nightstand and juice in the fridge. Highest sugar was 350 (COVID infection) >> ... 200s >> 330.  Glucometer: Accuchek   She continues on a plant-based diet without high glycemic index foods. In 02/2015 she started a plant-based diet and lost more than 80 pounds.  Pt's meals are: - Breakfast: Raw/soaked oatmeal, blueberries or other fruit, ground flaxseed, walnuts (2 tablespoons), soy milk - Lunch: Beans, whole grains, leafy greens, other vegetables and fruit - Dinner: Salad  with beans/peas or vegetable-based soup. - Snacks: 0-2-fruit, veggies, homemade bean dip, PB  -No CKD, last BUN/creatinine:  Lab Results  Component Value Date   BUN 7 08/22/2022   BUN 8 03/03/2022   CREATININE 0.49 08/22/2022   CREATININE 0.44 03/03/2022  Not on ACE inhibitor/ARB.  -+ History of HL, diet controlled; last set of lipids: Lab Results  Component Value Date   CHOL 163 08/22/2022   HDL 86.10 08/22/2022   LDLCALC 66 08/22/2022   TRIG 55.0 08/22/2022   CHOLHDL 2 08/22/2022  No statins.  - last eye exam was 08/05/2022: No DR  - NO numbness and tingling in her feet. Last foot exam: 08/22/2022.  Pt has FH of DM in MGM.  Hyponatremia: She has a history of low sodium: Lab Results  Component Value Date   NA 131 (L) 08/22/2022   NA 131 (L) 03/03/2022   NA 129 (L) 08/21/2021   NA 133 (L) 02/19/2021   NA 130 (L) 08/16/2020   NA 126 (L) 08/29/2019   NA 126 (L) 08/15/2019   NA 130 (L) 03/17/2019   NA 133 (L) 11/08/2018   NA 129 (L) 08/04/2018   NA 134 (L) 08/06/2017   NA 134 (L) 07/29/2016   NA 136 07/31/2015   NA 139 05/24/2013   Investigation for hyponatremia reviewed: Component     Latest Ref Rng & Units 08/16/2020  Glucose     65 - 99  mg/dL 60 (L)  BUN     7 - 25 mg/dL 8  Creatinine     9.62 - 0.99 mg/dL 9.52  GFR, Est Non African American     > OR = 60 mL/min/1.18m2 105  Sodium     135 - 146 mmol/L 130 (L)  Potassium     3.5 - 5.3 mmol/L 4.6  Chloride     98 - 110 mmol/L 94 (L)  Osmolality, Urine     50 - 1,200 mOsm/kg 142  Osmolality     278 - 305 mOsm/kg 268 (L)  Sodium, Urine     28 - 272 mmol/L 33   Plasma sodium was 130, slightly low >> hyponatremia Plasma osmolality was 268, lower than 275-280 >> hypoosmolar hyponatremia Urine sodium was 33, not very low >> ruling out excessive fluid losses (chronic diuretics, GI losses, excessive perspiration) or water overload Urine osmolality was 142, higher than 100 >> inappropriately high for  hyponatremia  We ruled out adrenal insufficiency by a normal cosyntropin stimulation test: Component     Latest Ref Rng & Units 11/30/2018 11/30/2018 11/30/2018         8:15 AM  8:59 AM  9:26 AM  Cortisol, Plasma     ug/dL 84.1 32.4 40.1  U272 ACTH     6 - 50 pg/mL 15     Also, she does not have a history of hypothyroidism: Lab Results  Component Value Date   TSH 1.07 11/08/2018   TSH 1.36 07/29/2016   TSH 2.41 07/31/2015   She is not on HCTZ, carbamazepine, amitriptyline, SSRIs, opiates. No history of brain injury. No history of generalized infections. No history of malignancy. No recent brain or chest imaging. No chronic kidney disease.  At previous visits, I advised her to try to reduce her fluid intake to 1 L a day.  She was able to limit her intake to 1.5 L a day, but this was difficult for her to do. She had a lot of fluid retention during her COVID-19 episode from 01/2021 and after taking the antiviral medication she eliminated this (approximately 7 to 8 pounds).  Mother also has a history of hyponatremia. Brother with otosclerosis.  ROS: + See HPI  I reviewed pt's medications, allergies, PMH, social hx, family hx, and changes were documented in the history of present illness. Otherwise, unchanged from my initial visit note.  Past Medical History:  Diagnosis Date   Anemia    Chicken pox    COVID    may 2022 cold s/s cough . blood sugars out of control.   COVID 10/2022   Depression    Resolved. in 60s- meds and counseling.   Diabetes mellitus (HCC)    Family history of hemochromatosis    father- screen iron   Gestational diabetes 09/08/1985   a1c q3 years   Hammer toe    Lichen planus    Sees [facility] dermatology - Dr. Sebastian Ache   Morton's neuroma    per records,pt.denies 10/15/21   Wears glasses    Past Surgical History:  Procedure Laterality Date   DILATATION & CURETTAGE/HYSTEROSCOPY WITH MYOSURE N/A 03/03/2022   Procedure: hysterscopic myomectomy dilation  and curretage;  Surgeon: Patton Salles, MD;  Location: [facility];  Service: Gynecology;  Laterality: N/A;  hysterscopic myomectomy dilation and curretage   ENDOMETRIAL ABLATION  2010   heavy menstrual bleeding   TONSILLECTOMY AND ADENOIDECTOMY  1965   Social History   Socioeconomic History  Marital status: Married    Spouse name: Not on file   Number of children: 2   Years of education: Not on file   Highest education level: Not on file  Occupational History    Librarian  Social Needs   Financial resource strain: Not on file   Food insecurity:    Worry: Not on file    Inability: Not on file   Transportation needs:    Medical: Not on file    Non-medical: Not on file  Tobacco Use   Smoking status: Never Smoker   Smokeless tobacco: Never Used  Substance and Sexual Activity   Alcohol use: Yes    Alcohol/week:  2-4 drinks per week    Types:  Wine/beer   Drug use: No   Sexual activity: Yes  Lifestyle   Physical activity: Walk/hike/spin class/weight resistance/stretching    Days per week: 7    Minutes per session: Not on file  Relationships  Social History Narrative   Married. (husband Fayrene Fearing, patient of Dr. Durene Cal). 2 children grown and married. B/c grandmother December 2016.    Has had 12 moves in 32 years, most recently [place] for 1.5 years, [place] 5 years prior, [place] prior      Cool. In nutrition. Masters in [employer]. In between jobs as Comptroller.       Current Outpatient Medications on File Prior to Visit  Medication Sig Dispense Refill   Accu-Chek FastClix Lancets MISC Use as instructed 204 each 3   acyclovir ointment (ZOVIRAX) 5 % Apply 1 application topically every 3 (three) hours. (Patient not taking: Reported on 12/10/2022) 30 g 1   BAQSIMI TWO PACK 3 MG/DOSE POWD Use as needed for hypoglycemia (Patient not taking: Reported on 12/10/2022) 1 each PRN   BD PEN NEEDLE NANO 2ND GEN 32G X 4 MM MISC USE 6-8 TIMES A DAY FOR  INSULIN 500 each 3   Blood Glucose Calibration (ACCU-CHEK GUIDE CONTROL) LIQD To calibrate Accu-Chek Guide meter 1 each 11   Cholecalciferol 50 MCG (2000 UT) CAPS Take 1 capsule by mouth daily.     Continuous Blood Gluc Sensor (DEXCOM G7 SENSOR) MISC 3 each by Does not apply route every 30 (thirty) days. Apply 1 sensor every 10 days 9 each 4   glucose blood test strip Use as instructed to check blood sugar 4 times a day - Accuchek Guide 100 each 3   Insulin Aspart, w/Niacinamide, (FIASP PENFILL) 100 UNIT/ML SOCT USE UP TO 20 UNITS A DAY AS ADVISED 15 mL 11   insulin glargine (LANTUS SOLOSTAR) 100 UNIT/ML Solostar Pen Check on the skin up to 10 units daily, as advised.  To replace Basaglar. 30 mL 3   NOVOPEN ECHO DEVI Use as advised 1 each 3   tetrahydrozoline-zinc (VISINE-AC) 0.05-0.25 % ophthalmic solution Place 1 drop into both eyes 3 (three) times daily as needed.     triamcinolone cream (KENALOG) 0.1 % For autoimmune rash, not taking right now     vitamin B-12 (CYANOCOBALAMIN) 250 MCG tablet Take 250 mcg by mouth daily. Takes it once a week     No current facility-administered medications on file prior to visit.   Allergies  Allergen Reactions   Penicillins Rash   Sulfa Antibiotics Rash   Family History  Problem Relation Age of Onset   Osteoporosis Mother    Depression Mother        dealing with some memory issues   Glaucoma Mother    Dementia Mother  Hemochromatosis Father    Alcoholism Father    Hearing loss Brother        otosclerosis   Hypertension Brother    Hyperlipidemia Brother    Diabetes Brother        ? PE as well- she will verify   Hyperlipidemia Brother    Stroke Other        grandparent   Hypertension Other        grandparent   Colon cancer Neg Hx    Colon polyps Neg Hx    Esophageal cancer Neg Hx    Rectal cancer Neg Hx    Stomach cancer Neg Hx    PE: LMP 09/09/2007 (Approximate) Comment: this was with her endometrial ablation Wt Readings from Last  3 Encounters:  12/10/22 117 lb (53.1 kg)  11/14/22 120 lb (54.4 kg)  10/15/22 120 lb (54.4 kg)   Constitutional: normal weight, in NAD Eyes:  EOMI, no exophthalmos ENT: no neck masses, no cervical lymphadenopathy Cardiovascular: RRR, No MRG Respiratory: CTA B Musculoskeletal: no deformities Skin:no rashes Neurological: no tremor with outstretched hands  ASSESSMENT: 1. LADA, insulin-dependent, controlled, without long-term complications but with occasional hyperglycemia  She had low C-peptide and high GAD antibodies: Component     Latest Ref Rng & Units 03/17/2019  Glucose     65 - 99 mg/dL 161 (H)  C-Peptide     0.80 - 3.85 ng/mL 0.55 (L)   Component     Latest Ref Rng & Units 11/08/2018  Glucose     70 - 99 mg/dL 096 (H)  C-Peptide     0.80 - 3.85 ng/mL 0.55 (L)  Glutamic Acid Decarb Ab     <5 IU/mL >250 (H)  Insulin Antibodies, Human     <0.4 U/mL 3.6 (H)   Component     Latest Ref Rng & Units 11/30/2018  ZNT8 Antibodies     U/mL <15  Islet Cell Ab     Neg:<1:1 Negative   2. HL  3. Hyponatremia -She is asymptomatic: no headaches, mental, confusion -Her sodium usually fluctuates between 126 and 133-mild hyponatremia.  Latest level was reviewed from last visit with PCP and this was slightly low, at 131, stable. 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 -After fluid restriction, if sodium level = normal >> most likely SIADH.  If sodium = still low >> possible reset osmostat.  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  PLAN:  1.  Patient with history of LADA, diagnosed in 2020, based on positive GAD antibodies and low C-peptide.  She continues on plant-based diet, with low glycemic index foods, and she exercises consistently.  She was able to avoid starting insulin for a long time but we ended up adding mealtime insulin in 2020 and long-acting insulin 2021.  She is very insulin sensitive and we are using very low doses of insulin for her.  We changed from Basaglar to Lantus at last visit per insurance preference.  She is on Fiasp. -At last visit, sugars appears to be fluctuating mostly within the  target range, with few hyperglycemic spikes.  In the previous 2 weeks before last visit, sugars were better, improved after the holidays.  She was having some low blood sugars after exercising in the evening and we discussed about possibly taking a lower dose of Lantus at bedtime and to correct less strictly the blood sugars after dinner whenever she exercises.  Otherwise, she was doing a wonderful job adjusting the dose of Fiasp based on the number of carbs, amount of fat in her meal, CGM trends.  We did not change her regimen otherwise.  HbA1c at last visit was 6.0%, excellent. CGM interpretation: -At today's visit, we reviewed her CGM downloads: It appears that *** of values are in target range (goal >70%), while *** are higher than 180 (goal <25%), and *** are lower than 70 (goal <4%).  The calculated average blood sugar is ***.  The projected HbA1c for the next 3 months (GMI) is ***. -Reviewing the CGM trends, ***  - I advised her to: Patient Instructions  Please continue: - Basaglar 4 units in am and 3 (when more active)-5 units at bedtime - FiAsp: insulin to carb ratio 1:20-30 (always use the 1:30 if going for a walk around dinnertime)  Use the following sliding scale:  Target: 150 Correction factor: 100 (at night, try to use only 1 unit for correction)  Please return in 4 months.  - we checked her HbA1c: 7%  - advised to  check sugars at different times of the day - 4x a day, rotating check times - advised for yearly eye exams >> she is UTD - return to clinic in 4 months  2. HL -Reviewed latest lipid panel from 08/2022: All fractions at goal: Lab Results  Component Value Date   CHOL 163 08/22/2022   HDL 86.10 08/22/2022   LDLCALC 66 08/22/2022   TRIG 55.0 08/22/2022   CHOLHDL 2 08/22/2022  -She is not on a statin but has excellent lipid fractions on a plant-based diet  Carlus Pavlov, MD PhD [facility] Endocrinology

## 2023-03-04 ENCOUNTER — Encounter: Payer: Self-pay | Admitting: Internal Medicine

## 2023-03-04 ENCOUNTER — Ambulatory Visit: Payer: BC Managed Care – PPO | Admitting: Internal Medicine

## 2023-03-04 VITALS — BP 118/72 | HR 69 | Ht 60.5 in | Wt 119.6 lb

## 2023-03-04 DIAGNOSIS — E139 Other specified diabetes mellitus without complications: Secondary | ICD-10-CM | POA: Diagnosis not present

## 2023-03-04 DIAGNOSIS — E109 Type 1 diabetes mellitus without complications: Secondary | ICD-10-CM

## 2023-03-04 DIAGNOSIS — E785 Hyperlipidemia, unspecified: Secondary | ICD-10-CM

## 2023-03-04 DIAGNOSIS — E119 Type 2 diabetes mellitus without complications: Secondary | ICD-10-CM

## 2023-03-04 DIAGNOSIS — Z794 Long term (current) use of insulin: Secondary | ICD-10-CM

## 2023-03-04 LAB — POCT GLYCOSYLATED HEMOGLOBIN (HGB A1C): Hemoglobin A1C: 6 % — AB (ref 4.0–5.6)

## 2023-03-04 NOTE — Patient Instructions (Addendum)
Please use: - Lantus 3-4 units in am and 3 (when more active)-3-4 units at bedtime - FiAsp: insulin to carb ratio 1:20-30 (always use the 1:30 if going for a walk around dinnertime)  Use the following sliding scale:  Target: 150 Correction factor: 100 (at night, try to use only 1 unit for correction)  You may need 1-2 units of insulin when you wake up.  Read "Think like a pancreas" - Mardee Postin.  Look up "Glucose Goddess" - Instagram.  Please return in 4 months.

## 2023-03-18 ENCOUNTER — Encounter: Payer: Self-pay | Admitting: Internal Medicine

## 2023-04-14 ENCOUNTER — Ambulatory Visit: Payer: BC Managed Care – PPO | Admitting: Family

## 2023-04-14 VITALS — BP 105/69 | HR 64 | Temp 97.7°F | Ht 60.05 in | Wt 123.6 lb

## 2023-04-14 DIAGNOSIS — J029 Acute pharyngitis, unspecified: Secondary | ICD-10-CM | POA: Diagnosis not present

## 2023-04-14 LAB — POCT RAPID STREP A (OFFICE): Rapid Strep A Screen: NEGATIVE

## 2023-04-14 NOTE — Progress Notes (Signed)
Patient ID: Brittany Barton, female    DOB: 24-Feb-1961, 62 y.o.   MRN: [id number]  Chief Complaint  Patient presents with   Sore Throat    sx for 1d   HPI:      Sore throat:  Pt c/o sore throat and fever of 100.0 since yesterday. Denies sinus sx or cough. Pt states she is feeling slightly better today. Exposed to strep by grandchildren. Pt has had tonsils removed in childhood.   Assessment & Plan:  1. Sore throat- rapid strep neg. Advised pt on taking Ibuprofen 600mg  tid prn for sore throat pain, swelling, and fever. Gargle with warm salt water several tid. OK to use OTC Chloraseptic spray and/or throat lozenges prn. Drink plenty of water.    - POCT rapid strep A  Subjective:    Outpatient Medications Prior to Visit  Medication Sig Dispense Refill   Accu-Chek FastClix Lancets MISC Use as instructed 204 each 3   acyclovir ointment (ZOVIRAX) 5 % Apply 1 application topically every 3 (three) hours. 30 g 1   BAQSIMI TWO PACK 3 MG/DOSE POWD Use as needed for hypoglycemia 1 each PRN   BD PEN NEEDLE NANO 2ND GEN 32G X 4 MM MISC USE 6-8 TIMES A DAY FOR INSULIN 500 each 3   Blood Glucose Calibration (ACCU-CHEK GUIDE CONTROL) LIQD To calibrate Accu-Chek Guide meter 1 each 11   Cholecalciferol 50 MCG (2000 UT) CAPS Take 1 capsule by mouth daily.     Continuous Blood Gluc Sensor (DEXCOM G7 SENSOR) MISC 3 each by Does not apply route every 30 (thirty) days. Apply 1 sensor every 10 days 9 each 4   glucose blood test strip Use as instructed to check blood sugar 4 times a day - Accuchek Guide 100 each 3   Insulin Aspart, w/Niacinamide, (FIASP PENFILL) 100 UNIT/ML SOCT USE UP TO 20 UNITS A DAY AS ADVISED 15 mL 11   insulin glargine (LANTUS SOLOSTAR) 100 UNIT/ML Solostar Pen Check on the skin up to 10 units daily, as advised.  To replace Basaglar. 30 mL 3   NOVOPEN ECHO DEVI Use as advised 1 each 3   tetrahydrozoline-zinc (VISINE-AC) 0.05-0.25 % ophthalmic solution Place 1 drop into both eyes 3  (three) times daily as needed.     triamcinolone cream (KENALOG) 0.1 % For autoimmune rash, not taking right now     vitamin B-12 (CYANOCOBALAMIN) 250 MCG tablet Take 250 mcg by mouth daily. Takes it once a week     XDEMVY 0.25 % SOLN Apply to eye.     No facility-administered medications prior to visit.   Past Medical History:  Diagnosis Date   Anemia    Chicken pox    COVID    may 2022 cold s/s cough . blood sugars out of control.   COVID 10/2022   Depression    Resolved. in 40s- meds and counseling.   Diabetes mellitus (HCC)    Family history of hemochromatosis    father- screen iron   Gestational diabetes 09/08/1985   a1c q3 years   Hammer toe    Lichen planus    Sees [facility] dermatology - Dr. Sebastian Ache   Morton's neuroma    per records,pt.denies 10/15/21   Wears glasses    Past Surgical History:  Procedure Laterality Date   DILATATION & CURETTAGE/HYSTEROSCOPY WITH MYOSURE N/A 03/03/2022   Procedure: hysterscopic myomectomy dilation and curretage;  Surgeon: Patton Salles, MD;  Location: [facility];  Service:  Gynecology;  Laterality: N/A;  hysterscopic myomectomy dilation and curretage   ENDOMETRIAL ABLATION  2010   heavy menstrual bleeding   TONSILLECTOMY AND ADENOIDECTOMY  1965   Allergies  Allergen Reactions   Penicillins Rash   Sulfa Antibiotics Rash      Objective:    Physical Exam Vitals and nursing note reviewed.  Constitutional:      Appearance: Normal appearance.  HENT:     Right Ear: Tympanic membrane and ear canal normal.     Left Ear: Tympanic membrane and ear canal normal.     Nose:     Right Sinus: No frontal sinus tenderness.     Left Sinus: No frontal sinus tenderness.     Mouth/Throat:     Mouth: Mucous membranes are moist.     Pharynx: Oropharynx is clear. Posterior oropharyngeal erythema (mild) present.     Tonsils: 0 on the right. 0 on the left.  Cardiovascular:     Rate and Rhythm: Normal rate and regular  rhythm.  Pulmonary:     Effort: Pulmonary effort is normal.     Breath sounds: Normal breath sounds.  Musculoskeletal:        General: Normal range of motion.  Skin:    General: Skin is warm and dry.  Neurological:     Mental Status: She is alert.  Psychiatric:        Mood and Affect: Mood normal.        Behavior: Behavior normal.    BP 105/69   Pulse 64   Temp 97.7 F (36.5 C) (Temporal)   Ht 5' 0.05" (1.525 m)   Wt 123 lb 9.6 oz (56.1 kg)   LMP 09/09/2007 (Approximate) Comment: this was with her endometrial ablation  SpO2 99%   BMI 24.10 kg/m  Wt Readings from Last 3 Encounters:  04/14/23 123 lb 9.6 oz (56.1 kg)  03/04/23 119 lb 9.6 oz (54.3 kg)  12/10/22 117 lb (53.1 kg)      Dulce Sellar, NP

## 2023-04-14 NOTE — Progress Notes (Deleted)
Patient ID: Randa Feltes, female    DOB: 1961-04-12, 62 y.o.   MRN: [id number]  No chief complaint on file.   HPI:      Sore throat:     Assessment & Plan:     Subjective:    Outpatient Medications Prior to Visit  Medication Sig Dispense Refill   Accu-Chek FastClix Lancets MISC Use as instructed 204 each 3   acyclovir ointment (ZOVIRAX) 5 % Apply 1 application topically every 3 (three) hours. 30 g 1   BAQSIMI TWO PACK 3 MG/DOSE POWD Use as needed for hypoglycemia 1 each PRN   BD PEN NEEDLE NANO 2ND GEN 32G X 4 MM MISC USE 6-8 TIMES A DAY FOR INSULIN 500 each 3   Blood Glucose Calibration (ACCU-CHEK GUIDE CONTROL) LIQD To calibrate Accu-Chek Guide meter 1 each 11   Cholecalciferol 50 MCG (2000 UT) CAPS Take 1 capsule by mouth daily.     Continuous Blood Gluc Sensor (DEXCOM G7 SENSOR) MISC 3 each by Does not apply route every 30 (thirty) days. Apply 1 sensor every 10 days 9 each 4   glucose blood test strip Use as instructed to check blood sugar 4 times a day - Accuchek Guide 100 each 3   Insulin Aspart, w/Niacinamide, (FIASP PENFILL) 100 UNIT/ML SOCT USE UP TO 20 UNITS A DAY AS ADVISED 15 mL 11   insulin glargine (LANTUS SOLOSTAR) 100 UNIT/ML Solostar Pen Check on the skin up to 10 units daily, as advised.  To replace Basaglar. 30 mL 3   NOVOPEN ECHO DEVI Use as advised 1 each 3   tetrahydrozoline-zinc (VISINE-AC) 0.05-0.25 % ophthalmic solution Place 1 drop into both eyes 3 (three) times daily as needed.     triamcinolone cream (KENALOG) 0.1 % For autoimmune rash, not taking right now     vitamin B-12 (CYANOCOBALAMIN) 250 MCG tablet Take 250 mcg by mouth daily. Takes it once a week     XDEMVY 0.25 % SOLN Apply to eye.     No facility-administered medications prior to visit.   Past Medical History:  Diagnosis Date   Anemia    Chicken pox    COVID    may 2022 cold s/s cough . blood sugars out of control.   COVID 10/2022   Depression    Resolved. in 54s- meds and  counseling.   Diabetes mellitus (HCC)    Family history of hemochromatosis    father- screen iron   Gestational diabetes 09/08/1985   a1c q3 years   Hammer toe    Lichen planus    Sees [facility] dermatology - Dr. Sebastian Ache   Morton's neuroma    per records,pt.denies 10/15/21   Wears glasses    Past Surgical History:  Procedure Laterality Date   DILATATION & CURETTAGE/HYSTEROSCOPY WITH MYOSURE N/A 03/03/2022   Procedure: hysterscopic myomectomy dilation and curretage;  Surgeon: Patton Salles, MD;  Location: [facility];  Service: Gynecology;  Laterality: N/A;  hysterscopic myomectomy dilation and curretage   ENDOMETRIAL ABLATION  2010   heavy menstrual bleeding   TONSILLECTOMY AND ADENOIDECTOMY  1965   Allergies  Allergen Reactions   Penicillins Rash   Sulfa Antibiotics Rash      Objective:    Physical Exam Vitals and nursing note reviewed.  Constitutional:      Appearance: Normal appearance.  Cardiovascular:     Rate and Rhythm: Normal rate and regular rhythm.  Pulmonary:     Effort: Pulmonary effort is  normal.     Breath sounds: Normal breath sounds.  Musculoskeletal:        General: Normal range of motion.  Skin:    General: Skin is warm and dry.  Neurological:     Mental Status: She is alert.  Psychiatric:        Mood and Affect: Mood normal.        Behavior: Behavior normal.    LMP 09/09/2007 (Approximate) Comment: this was with her endometrial ablation Wt Readings from Last 3 Encounters:  03/04/23 119 lb 9.6 oz (54.3 kg)  12/10/22 117 lb (53.1 kg)  11/14/22 120 lb (54.4 kg)       Dulce Sellar, NP

## 2023-04-30 ENCOUNTER — Ambulatory Visit: Payer: BC Managed Care – PPO | Admitting: Family

## 2023-04-30 NOTE — Progress Notes (Deleted)
Patient ID: Brittany Barton, female    DOB: [birth year], 62 y.o.   MRN: [id number]  No chief complaint on file.   HPI:           Assessment & Plan:     Subjective:    Outpatient Medications Prior to Visit  Medication Sig Dispense Refill   Accu-Chek FastClix Lancets MISC Use as instructed 204 each 3   acyclovir ointment (ZOVIRAX) 5 % Apply 1 application topically every 3 (three) hours. 30 g 1   BAQSIMI TWO PACK 3 MG/DOSE POWD Use as needed for hypoglycemia 1 each PRN   BD PEN NEEDLE NANO 2ND GEN 32G X 4 MM MISC USE 6-8 TIMES A DAY FOR INSULIN 500 each 3   Blood Glucose Calibration (ACCU-CHEK GUIDE CONTROL) LIQD To calibrate Accu-Chek Guide meter 1 each 11   Cholecalciferol 50 MCG (2000 UT) CAPS Take 1 capsule by mouth daily.     Continuous Blood Gluc Sensor (DEXCOM G7 SENSOR) MISC 3 each by Does not apply route every 30 (thirty) days. Apply 1 sensor every 10 days 9 each 4   glucose blood test strip Use as instructed to check blood sugar 4 times a day - Accuchek Guide 100 each 3   Insulin Aspart, w/Niacinamide, (FIASP PENFILL) 100 UNIT/ML SOCT USE UP TO 20 UNITS A DAY AS ADVISED 15 mL 11   insulin glargine (LANTUS SOLOSTAR) 100 UNIT/ML Solostar Pen Check on the skin up to 10 units daily, as advised.  To replace Basaglar. 30 mL 3   NOVOPEN ECHO DEVI Use as advised 1 each 3   tetrahydrozoline-zinc (VISINE-AC) 0.05-0.25 % ophthalmic solution Place 1 drop into both eyes 3 (three) times daily as needed.     triamcinolone cream (KENALOG) 0.1 % For autoimmune rash, not taking right now     vitamin B-12 (CYANOCOBALAMIN) 250 MCG tablet Take 250 mcg by mouth daily. Takes it once a week     No facility-administered medications prior to visit.   Past Medical History:  Diagnosis Date   Anemia    Chicken pox    COVID    may 2022 cold s/s cough . blood sugars out of control.   COVID 10/2022   Depression    Resolved. in 41s- meds and counseling.   Diabetes mellitus (HCC)    Family  history of hemochromatosis    father- screen iron   Gestational diabetes 09/08/1985   a1c q3 years   Hammer toe    Lichen planus    Sees [facility] dermatology - Dr. Sebastian Ache   Morton's neuroma    per records,pt.denies 10/15/21   Wears glasses    Past Surgical History:  Procedure Laterality Date   DILATATION & CURETTAGE/HYSTEROSCOPY WITH MYOSURE N/A 03/03/2022   Procedure: hysterscopic myomectomy dilation and curretage;  Surgeon: Patton Salles, MD;  Location: [facility];  Service: Gynecology;  Laterality: N/A;  hysterscopic myomectomy dilation and curretage   ENDOMETRIAL ABLATION  2010   heavy menstrual bleeding   TONSILLECTOMY AND ADENOIDECTOMY  1965   Allergies  Allergen Reactions   Penicillins Rash   Sulfa Antibiotics Rash      Objective:    Physical Exam Vitals and nursing note reviewed.  Constitutional:      Appearance: Normal appearance. She is ill-appearing.     Interventions: Face mask in place.  HENT:     Right Ear: Tympanic membrane and ear canal normal.     Left Ear: Tympanic membrane and ear  canal normal.     Nose:     Right Sinus: Frontal sinus tenderness present.     Left Sinus: Frontal sinus tenderness present.     Mouth/Throat:     Mouth: Mucous membranes are moist.     Pharynx: Posterior oropharyngeal erythema present. No pharyngeal swelling, oropharyngeal exudate or uvula swelling.     Tonsils: No tonsillar exudate or tonsillar abscesses.  Cardiovascular:     Rate and Rhythm: Normal rate and regular rhythm.  Pulmonary:     Effort: Pulmonary effort is normal.     Breath sounds: Normal breath sounds.  Musculoskeletal:        General: Normal range of motion.  Lymphadenopathy:     Head:     Right side of head: No preauricular or posterior auricular adenopathy.     Left side of head: No preauricular or posterior auricular adenopathy.     Cervical: No cervical adenopathy.  Skin:    General: Skin is warm and dry.  Neurological:      Mental Status: She is alert.  Psychiatric:        Mood and Affect: Mood normal.        Behavior: Behavior normal.    LMP 09/09/2007 (Approximate) Comment: this was with her endometrial ablation Wt Readings from Last 3 Encounters:  04/14/23 123 lb 9.6 oz (56.1 kg)  03/04/23 119 lb 9.6 oz (54.3 kg)  12/10/22 117 lb (53.1 kg)       Dulce Sellar, NP

## 2023-05-14 ENCOUNTER — Encounter: Payer: Self-pay | Admitting: Family

## 2023-05-14 ENCOUNTER — Telehealth (INDEPENDENT_AMBULATORY_CARE_PROVIDER_SITE_OTHER): Payer: BC Managed Care – PPO | Admitting: Family

## 2023-05-14 VITALS — Temp 99.0°F | Ht 60.0 in | Wt 123.0 lb

## 2023-05-14 DIAGNOSIS — U071 COVID-19: Secondary | ICD-10-CM | POA: Diagnosis not present

## 2023-05-14 NOTE — Progress Notes (Signed)
MyChart Video Visit    Virtual Visit via Video Note   This format is felt to be most appropriate for this patient at this time. Physical exam was limited by quality of the video and audio technology used for the visit. CMA was able to get the patient set up on a video visit.  Patient location: Home. Patient and provider in visit Provider location: Office  I discussed the limitations of evaluation and management by telemedicine and the availability of in person appointments. The patient expressed understanding and agreed to proceed.  Visit Date: 05/14/2023  Today's healthcare provider: Dulce Sellar, NP     Subjective:   Patient ID: Brittany Barton, female    DOB: 13-Aug-1961, 62 y.o.   MRN: [id number]  Chief Complaint  Patient presents with   Covid Positive    sx for 4d    HPI Covid positive: pt started with sx on Monday. Pt tested positive for Covid on 9/4, sx include cough, headache, chills, Nasal congestion, Low grade 99.0. She has tried mucinex DM which did help slightly. Pt also had covid back in March of this year and took Paxlovid. Pt reports her CBGs have not been too high.    Assessment & Plan:  COVID-19- Discussed Paxlovid and d/t pt having minimal sx, mutually decided to hold off taking this time.  Advised of CDC guidelines for masking if out in public. OK to continue taking OTC sinus or pain meds. Recommend Naproxen, 1-2 tabs bid for aches/pains/fever. Encouraged to monitor & notify office of any worsening symptoms: increased shortness of breath, weakness, and signs of dehydration. Instructed to rest and hydrate well.   Past Medical History:  Diagnosis Date   Anemia    Chicken pox    COVID    may 2022 cold s/s cough . blood sugars out of control.   COVID 10/2022   Depression    Resolved. in 2s- meds and counseling.   Diabetes mellitus (HCC)    Family history of hemochromatosis    father- screen iron   Gestational diabetes 09/08/1985   a1c q3  years   Hammer toe    Lichen planus    Sees [facility] dermatology - Dr. Sebastian Ache   Morton's neuroma    per records,pt.denies 10/15/21   Wears glasses     Past Surgical History:  Procedure Laterality Date   DILATATION & CURETTAGE/HYSTEROSCOPY WITH MYOSURE N/A 03/03/2022   Procedure: hysterscopic myomectomy dilation and curretage;  Surgeon: Patton Salles, MD;  Location: [facility];  Service: Gynecology;  Laterality: N/A;  hysterscopic myomectomy dilation and curretage   ENDOMETRIAL ABLATION  2010   heavy menstrual bleeding   TONSILLECTOMY AND ADENOIDECTOMY  1965    Outpatient Medications Prior to Visit  Medication Sig Dispense Refill   Accu-Chek FastClix Lancets MISC Use as instructed 204 each 3   acyclovir ointment (ZOVIRAX) 5 % Apply 1 application topically every 3 (three) hours. 30 g 1   BAQSIMI TWO PACK 3 MG/DOSE POWD Use as needed for hypoglycemia 1 each PRN   BD PEN NEEDLE NANO 2ND GEN 32G X 4 MM MISC USE 6-8 TIMES A DAY FOR INSULIN 500 each 3   Blood Glucose Calibration (ACCU-CHEK GUIDE CONTROL) LIQD To calibrate Accu-Chek Guide meter 1 each 11   Cholecalciferol 50 MCG (2000 UT) CAPS Take 1 capsule by mouth daily.     Continuous Blood Gluc Sensor (DEXCOM G7 SENSOR) MISC 3 each by Does not apply route every  30 (thirty) days. Apply 1 sensor every 10 days 9 each 4   glucose blood test strip Use as instructed to check blood sugar 4 times a day - Accuchek Guide 100 each 3   Insulin Aspart, w/Niacinamide, (FIASP PENFILL) 100 UNIT/ML SOCT USE UP TO 20 UNITS A DAY AS ADVISED 15 mL 11   insulin glargine (LANTUS SOLOSTAR) 100 UNIT/ML Solostar Pen Check on the skin up to 10 units daily, as advised.  To replace Basaglar. 30 mL 3   NOVOPEN ECHO DEVI Use as advised 1 each 3   tetrahydrozoline-zinc (VISINE-AC) 0.05-0.25 % ophthalmic solution Place 1 drop into both eyes 3 (three) times daily as needed.     triamcinolone cream (KENALOG) 0.1 % For autoimmune rash, not taking  right now     vitamin B-12 (CYANOCOBALAMIN) 250 MCG tablet Take 250 mcg by mouth daily. Takes it once a week     No facility-administered medications prior to visit.    Allergies  Allergen Reactions   Penicillins Rash   Sulfa Antibiotics Rash       Objective:   Physical Exam Vitals and nursing note reviewed.  Constitutional:      General: Pt is not in acute distress.    Appearance: Normal appearance.  HENT:     Head: Normocephalic.  Pulmonary:     Effort: No respiratory distress.  Musculoskeletal:     Cervical back: Normal range of motion.  Skin:    General: Skin is dry.     Coloration: Skin is not pale.  Neurological:     Mental Status: Pt is alert and oriented to person, place, and time.  Psychiatric:        Mood and Affect: Mood normal.   Temp 99 F (37.2 C) (Oral)   Ht 5' (1.524 m)   Wt 123 lb (55.8 kg)   LMP 09/09/2007 (Approximate) Comment: this was with her endometrial ablation  BMI 24.02 kg/m   Wt Readings from Last 3 Encounters:  05/14/23 123 lb (55.8 kg)  04/14/23 123 lb 9.6 oz (56.1 kg)  03/04/23 119 lb 9.6 oz (54.3 kg)        I discussed the assessment and treatment plan with the patient. The patient was provided an opportunity to ask questions and all were answered. The patient agreed with the plan and demonstrated an understanding of the instructions.   The patient was advised to call back or seek an in-person evaluation if the symptoms worsen or if the condition fails to improve as anticipated.  Dulce Sellar, NP [facility] [place] [phone] (phone) [phone] (fax)  [facility]

## 2023-05-15 NOTE — Addendum Note (Signed)
Addended by: Pollie Meyer on: 05/15/2023 11:34 AM   Modules accepted: Orders

## 2023-05-23 ENCOUNTER — Encounter: Payer: Self-pay | Admitting: Internal Medicine

## 2023-05-23 DIAGNOSIS — E139 Other specified diabetes mellitus without complications: Secondary | ICD-10-CM

## 2023-05-25 MED ORDER — FIASP PENFILL 100 UNIT/ML ~~LOC~~ SOCT: SUBCUTANEOUS | 11 refills | Status: DC

## 2023-07-03 ENCOUNTER — Other Ambulatory Visit: Payer: Self-pay | Admitting: Internal Medicine

## 2023-07-03 DIAGNOSIS — E139 Other specified diabetes mellitus without complications: Secondary | ICD-10-CM

## 2023-07-07 ENCOUNTER — Encounter: Payer: Self-pay | Admitting: Internal Medicine

## 2023-07-07 ENCOUNTER — Ambulatory Visit: Payer: BC Managed Care – PPO | Admitting: Internal Medicine

## 2023-07-07 VITALS — BP 120/70 | HR 57 | Ht 60.0 in | Wt 120.2 lb

## 2023-07-07 DIAGNOSIS — E139 Other specified diabetes mellitus without complications: Secondary | ICD-10-CM | POA: Diagnosis not present

## 2023-07-07 DIAGNOSIS — E109 Type 1 diabetes mellitus without complications: Secondary | ICD-10-CM

## 2023-07-07 DIAGNOSIS — E785 Hyperlipidemia, unspecified: Secondary | ICD-10-CM | POA: Diagnosis not present

## 2023-07-07 LAB — POCT GLYCOSYLATED HEMOGLOBIN (HGB A1C): Hemoglobin A1C: 6.2 % — AB (ref 4.0–5.6)

## 2023-07-07 MED ORDER — ACCU-CHEK FASTCLIX LANCETS MISC: 3 refills | Status: AC

## 2023-07-07 NOTE — Progress Notes (Signed)
Patient ID: Brittany Barton, female   DOB: [birth year], 62 y.o.   MRN: [id number]   HPI: Brittany Barton is a 62 y.o.-year-old female, initially referred by her PCP, Dr. 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  Last visit 4 months ago.  Interim history: No increased urination, blurry vision, chest pain. She had more stress in the last 2 weeks, taking care of her parents.  She was exercising less.  And had some dietary indiscretions.  Sugars have been slightly higher.  She has insomnia. Reviewed HbA1c levels: Lab Results  Component Value Date   HGBA1C 6.0 (A) 03/04/2023   HGBA1C 6.0 (A) 10/15/2022   HGBA1C 5.9 (A) 06/13/2022   HGBA1C 5.6 02/10/2022   HGBA1C 6.1 (A) 10/21/2021   HGBA1C 5.7 (A) 06/18/2021   HGBA1C 5.8 (A) 02/19/2021   HGBA1C 5.9 (A) 10/16/2020   HGBA1C 5.8 (A) 06/12/2020   HGBA1C 5.7 (A) 02/08/2020   She is on: - Lantus 3-4 units in am and 3 (when more active)-4 units at bedtime - FiAsp ICR 1:20-30 (always use the 1:30 if going for a walk around dinnertime) >>  B'fast: 1:10 Lunch: 1:20-30 Dinner: 1:20-30 - FiAsp sliding scale:  Target: 150 Correction factor: 100 (at night, try to use only 1 unit for correction) Inject FiAsp 10-15 min before coffee. Previously on Lantus, Humalog, and Lyumjev.  She checks her sugars more than 4 times a day with her Dexcom CGM:   Prev.   Previously:  Lowest sugar was 40 >> ...   52 >> 49 >> 52; she has hypoglycemia awareness at 70.  She keeps glucose tablets on the nightstand and juice in the fridge. Highest sugar was 330 >> 200s >> 300s.  Glucometer: Accuchek   She continues on a plant-based diet without high glycemic index foods. In 02/2015 she started a plant-based diet and lost more than 80 pounds.  Pt's meals are: - Breakfast: Raw/soaked oatmeal, blueberries or other fruit, ground  flaxseed, walnuts (2 tablespoons), soy milk - Lunch: Beans, whole grains, leafy greens, other vegetables and fruit - Dinner: Salad with beans/peas or vegetable-based soup. - Snacks: 0-2-fruit, veggies, homemade bean dip, PB She uses Chronometer app for carb counting.  -No CKD, last BUN/creatinine:  Lab Results  Component Value Date   BUN 7 08/22/2022   BUN 8 03/03/2022   CREATININE 0.49 08/22/2022   CREATININE 0.44 03/03/2022   Lab Results  Component Value Date   MICRALBCREAT 3.5 08/22/2022  Not on ACE inhibitor/ARB.  -+ History of HL, diet controlled; last set of lipids: Lab Results  Component Value Date   CHOL 163 08/22/2022   HDL 86.10 08/22/2022   LDLCALC 66 08/22/2022   TRIG 55.0 08/22/2022   CHOLHDL 2 08/22/2022  No statins.  - last eye exam was 02/13/2023: No DR  - NO numbness and tingling in her feet. Last foot exam: 08/22/2022.  Pt has FH of DM in MGM.  Hyponatremia: She has a history of low sodium: Lab Results  Component Value Date   NA 131 (L) 08/22/2022   NA 131 (L) 03/03/2022   NA 129 (L) 08/21/2021   NA 133 (L) 02/19/2021   NA 130 (L) 08/16/2020   NA 126 (L) 08/29/2019   NA 126 (L) 08/15/2019   NA 130 (L) 03/17/2019   NA 133 (L) 11/08/2018   NA 129 (L) 08/04/2018   NA 134 (  L) 08/06/2017   NA 134 (L) 07/29/2016   NA 136 07/31/2015   NA 139 05/24/2013   Investigation for hyponatremia reviewed: Component     Latest Ref Rng & Units 08/16/2020  Glucose     65 - 99 mg/dL 60 (L)  BUN     7 - 25 mg/dL 8  Creatinine     9.14 - 0.99 mg/dL 7.82  GFR, Est Non African American     > OR = 60 mL/min/1.58m2 105  Sodium     135 - 146 mmol/L 130 (L)  Potassium     3.5 - 5.3 mmol/L 4.6  Chloride     98 - 110 mmol/L 94 (L)  Osmolality, Urine     50 - 1,200 mOsm/kg 142  Osmolality     278 - 305 mOsm/kg 268 (L)  Sodium, Urine     28 - 272 mmol/L 33   Plasma sodium was 130, slightly low >> hyponatremia Plasma osmolality was 268, lower than 275-280 >>  hypoosmolar hyponatremia Urine sodium was 33, not very low >> ruling out excessive fluid losses (chronic diuretics, GI losses, excessive perspiration) or water overload Urine osmolality was 142, higher than 100 >> inappropriately high for hyponatremia  We ruled out adrenal insufficiency by a normal cosyntropin stimulation test: Component     Latest Ref Rng & Units 11/30/2018 11/30/2018 11/30/2018         8:15 AM  8:59 AM  9:26 AM  Cortisol, Plasma     ug/dL 95.6 21.3 08.6  V784 ACTH     6 - 50 pg/mL 15     She does not have hypothyroidism: Lab Results  Component Value Date   TSH 1.07 11/08/2018   TSH 1.36 07/29/2016   TSH 2.41 07/31/2015   She is not on HCTZ, carbamazepine, amitriptyline, SSRIs, opiates. No history of brain injury. No history of generalized infections. No history of malignancy. No recent brain or chest imaging. No chronic kidney disease.  At previous visits, I advised her to try to reduce her fluid intake to 1 L a day.  She was able to limit her intake to 1.5 L a day, but this was difficult for her to do. She had a lot of fluid retention during her COVID-19 episode from 01/2021 and after taking the antiviral medication she eliminated this (approximately 7 to 8 pounds).  Mother also has a history of hyponatremia. Brother with otosclerosis.  ROS: + See HPI  I reviewed pt's medications, allergies, PMH, social hx, family hx, and changes were documented in the history of present illness. Otherwise, unchanged from my initial visit note.  Past Medical History:  Diagnosis Date   Anemia    Chicken pox    COVID    may 2022 cold s/s cough . blood sugars out of control.   COVID 10/2022   Depression    Resolved. in 34s- meds and counseling.   Diabetes mellitus (HCC)    Family history of hemochromatosis    father- screen iron   Gestational diabetes 09/08/1985   a1c q3 years   Hammer toe    Lichen planus    Sees [facility] dermatology - Dr. Sebastian Ache   Morton's  neuroma    per records,pt.denies 10/15/21   Wears glasses    Past Surgical History:  Procedure Laterality Date   DILATATION & CURETTAGE/HYSTEROSCOPY WITH MYOSURE N/A 03/03/2022   Procedure: hysterscopic myomectomy dilation and curretage;  Surgeon: Patton Salles, MD;  Location: [facility];  Service: Gynecology;  Laterality: N/A;  hysterscopic myomectomy dilation and curretage   ENDOMETRIAL ABLATION  2010   heavy menstrual bleeding   TONSILLECTOMY AND ADENOIDECTOMY  1965   Social History   Socioeconomic History   Marital status: Married    Spouse name: Not on file   Number of children: 2   Years of education: Not on file   Highest education level: Not on file  Occupational History    Librarian  Social Needs   Financial resource strain: Not on file   Food insecurity:    Worry: Not on file    Inability: Not on file   Transportation needs:    Medical: Not on file    Non-medical: Not on file  Tobacco Use   Smoking status: Never Smoker   Smokeless tobacco: Never Used  Substance and Sexual Activity   Alcohol use: Yes    Alcohol/week:  2-4 drinks per week    Types:  Wine/beer   Drug use: No   Sexual activity: Yes  Lifestyle   Physical activity: Walk/hike/spin class/weight resistance/stretching    Days per week: 7    Minutes per session: Not on file  Relationships  Social History Narrative   Married. (husband Fayrene Fearing, patient of Dr. Durene Cal). 2 children grown and married. B/c grandmother December 2016.    Has had 12 moves in 32 years, most recently [place] for 1.5 years, [place] 5 years prior, [place] prior      [place]. In nutrition. Masters in [employer]. In between jobs as Comptroller.       Current Outpatient Medications on File Prior to Visit  Medication Sig Dispense Refill   Accu-Chek FastClix Lancets MISC Use as instructed 204 each 3   ACCU-CHEK GUIDE test strip USE AS INSTRUCTED TO CHECK BLOOD SUGAR 4 TIMES A DAY - ACCUCHEK GUIDE 100  strip 3   acyclovir ointment (ZOVIRAX) 5 % Apply 1 application topically every 3 (three) hours. 30 g 1   BAQSIMI TWO PACK 3 MG/DOSE POWD Use as needed for hypoglycemia 1 each PRN   BD PEN NEEDLE NANO 2ND GEN 32G X 4 MM MISC USE 6-8 TIMES A DAY FOR INSULIN 500 each 3   Blood Glucose Calibration (ACCU-CHEK GUIDE CONTROL) LIQD To calibrate Accu-Chek Guide meter 1 each 11   Cholecalciferol 50 MCG (2000 UT) CAPS Take 1 capsule by mouth daily.     Continuous Blood Gluc Sensor (DEXCOM G7 SENSOR) MISC 3 each by Does not apply route every 30 (thirty) days. Apply 1 sensor every 10 days 9 each 4   Insulin Aspart, w/Niacinamide, (FIASP PENFILL) 100 UNIT/ML SOCT Use up to 30 units a day as advised 30 mL 11   insulin glargine (LANTUS SOLOSTAR) 100 UNIT/ML Solostar Pen Check on the skin up to 10 units daily, as advised.  To replace Basaglar. 30 mL 3   NOVOPEN ECHO DEVI Use as advised 1 each 3   tetrahydrozoline-zinc (VISINE-AC) 0.05-0.25 % ophthalmic solution Place 1 drop into both eyes 3 (three) times daily as needed.     triamcinolone cream (KENALOG) 0.1 % For autoimmune rash, not taking right now     vitamin B-12 (CYANOCOBALAMIN) 250 MCG tablet Take 250 mcg by mouth daily. Takes it once a week     No current facility-administered medications on file prior to visit.   Allergies  Allergen Reactions   Penicillins Rash   Sulfa Antibiotics Rash   Family History  Problem Relation Age of Onset  Osteoporosis Mother    Depression Mother        dealing with some memory issues   Glaucoma Mother    Dementia Mother    Hemochromatosis Father    Alcoholism Father    Hearing loss Brother        otosclerosis   Hypertension Brother    Hyperlipidemia Brother    Diabetes Brother        ? PE as well- she will verify   Hyperlipidemia Brother    Stroke Other        grandparent   Hypertension Other        grandparent   Colon cancer Neg Hx    Colon polyps Neg Hx    Esophageal cancer Neg Hx    Rectal cancer  Neg Hx    Stomach cancer Neg Hx    PE: BP 120/70   Pulse (!) 57   Ht 5' (1.524 m)   Wt 120 lb 3.2 oz (54.5 kg)   LMP 09/09/2007 (Approximate) Comment: this was with her endometrial ablation  SpO2 99%   BMI 23.47 kg/m  Wt Readings from Last 3 Encounters:  07/07/23 120 lb 3.2 oz (54.5 kg)  05/14/23 123 lb (55.8 kg)  04/14/23 123 lb 9.6 oz (56.1 kg)   Constitutional: normal weight, in NAD Eyes:  EOMI, no exophthalmos ENT: no neck masses, no cervical lymphadenopathy Cardiovascular: RRR, No MRG Respiratory: CTA B Musculoskeletal: no deformities Skin:no rashes Neurological: no tremor with outstretched hands Diabetic Foot Exam - Simple   Simple Foot Form Diabetic Foot exam was performed with the following findings: Yes 07/07/2023 11:54 AM  Visual Inspection No deformities, no ulcerations, no other skin breakdown bilaterally: Yes Sensation Testing Intact to touch and monofilament testing bilaterally: Yes Pulse Check Posterior Tibialis and Dorsalis pulse intact bilaterally: Yes Comments    ASSESSMENT: 1.  LADA, controlled, without long-term complications but with occasional hyperglycemia  She had low C-peptide and high GAD antibodies: Component     Latest Ref Rng & Units 03/17/2019  Glucose     65 - 99 mg/dL 161 (H)  C-Peptide     0.80 - 3.85 ng/mL 0.55 (L)   Component     Latest Ref Rng & Units 11/08/2018  Glucose     70 - 99 mg/dL 096 (H)  C-Peptide     0.80 - 3.85 ng/mL 0.55 (L)  Glutamic Acid Decarb Ab     <5 IU/mL >250 (H)  Insulin Antibodies, Human     <0.4 U/mL 3.6 (H)   Component     Latest Ref Rng & Units 11/30/2018  ZNT8 Antibodies     U/mL <15  Islet Cell Ab     Neg:<1:1 Negative   2. HL  3. Hyponatremia -She is asymptomatic: no headaches, mental, confusion -Her sodium usually fluctuates between 126 and 133-mild hyponatremia.  Latest level was reviewed from last visit with PCP and this was slightly low, at 131, stable. 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 -After fluid restriction, if sodium level = normal >> most likely SIADH.  If sodium = still low >> possible reset  osmostat.  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  PLAN:  1. Patient with history of LADA, diagnosed in 2020 due to positive GAD antibodies and low C-peptide.  She continues on a plant-based diet with low glycemic index foods and she exercises consistently.  She was able to avoid starting insulin for a long time but we ended up adding mealtime insulin in 2020 and long-acting insulin in 2021.  She is very insulin sensitive and we are using very low doses of insulin for her.  At last visit, sugars were mostly fluctuating within the target range but with occasional hyperglycemic spikes especially after breakfast and more so after dinner.  Before dinner, she had mild lows.  We discussed about trying to reduce the Lantus in the morning from 4 units when she was more active and 5 units when she was less active to 3 and 4 units, respectively.  I was hoping that this may help her bounce back hyperglycemia after dinner.  We discussed that if sugars remain higher after dinner, she could increase the Fiasp dose slightly.  Sugars were higher in the morning after waking up even without drinking coffee or eating.  We discussed that she could use 1 to 2 units of insulin when she was waking up.  I also gave her references for diabetes management HbA1c at 6.0%, stable, at goal. CGM interpretation: -At today's visit, we reviewed her CGM downloads: It  appears that 83% of values are in target range (goal >70%), while 15% are higher than 180 (goal <25%), and 2% are lower than 70 (goal <4%).  The calculated average blood sugar is 136.  The projected HbA1c for the next 3 months (GMI) is 6.6%. -Reviewing the CGM trends, sugars appear to be fluctuating mostly within the target range, with hyperglycemic values occasionally after dinner (approximately a third of the time) and less frequently with the other meals.  We discussed at length about correct carb counting (I advised her to try to subtract the fiber, but for now I did not suggest to account for protein and fat, as she was trying to do).  She is used to calculating total amount of carbs and foods but I advised her that subtracting the fiber is more correct so I did suggest stronger insulin to carb ratios if she is only counting net carbs, without the fiber.  Also, we discussed about lag time (advised her t bolus slightly and advanced if the sugars are high before certain meal), I explained the parameters in the sliding scale upon her questioning (how to take this into account and also we decided to strengthen her sensitivity factor from 100 to 50), we discussed about continuing with the same doses of Lantus decreasing the dose when she is more active and increasing the dose when she is less active, but for now I did not feel that she needs different doses, by the review of her CGM data.  For now we will continue to bolus 1 to 2 units of insulin in the morning, before coffee, if the sugars are elevated especially.  Otherwise, I did not suggest changes in her regimen especially as she feels that the last 2 weeks of data were a little bit higher than she is used to due to increased stress and some dietary indiscretions. -I called in a prescription for her for lancets. -Given further references regarding type I diabetic management. - I advised her  to: Patient Instructions  Please use: - Lantus 3 (when more  active)-4 units in am and 3 (when more active)-4 units at bedtime - FiAsp: B'fast: 1:10  Lunch: 1:15 (-20) Dinner: 1:15 (-20) - FiAsp sliding scale:  Target: 150 Correction factor: 100 >> 50 (at night, try to use only 1 unit for correction)   You may need 1-2 units of insulin when you wake up.  Listen to the [employer] podcast.   Please return in 4 months.  - we checked her HbA1c: 6.2% (slightly higher) - advised to check sugars at different times of the day - 4x a day, rotating check times - advised for yearly eye exams >> she is UTD - foot exam performed today - labs will be due in 2 months.  She has an appointment with PCP coming up. - return to clinic in 4 months  2. HL -Reviewed latest lipid panel from 08/2022: All fractions at goal Lab Results  Component Value Date   CHOL 163 08/22/2022   HDL 86.10 08/22/2022   LDLCALC 66 08/22/2022   TRIG 55.0 08/22/2022   CHOLHDL 2 08/22/2022  -She is not on a statin but has excellent lipid fractions on a plant-based diet  - Total time spent for the visit (outside reviewing CGM data): 40 min, in precharting, postcharting, reviewing last note, obtaining medical information from the chart and from the pt, reviewing her  previous labs, evaluations, and treatments, reviewing her symptoms, counseling her about her diabetes and nutrition (please see the discussed topics above), and developing a plan to further treat her diabetes; she had a number of questions which I addressed.  Carlus Pavlov, MD PhD [facility] Endocrinology

## 2023-07-07 NOTE — Patient Instructions (Addendum)
Please use: - Lantus 3 (when more active)-4 units in am and 3 (when more active)-4 units at bedtime - FiAsp: B'fast: 1:10  Lunch: 1:15 (-20) Dinner: 1:15 (-20) - FiAsp sliding scale:  Target: 150 Correction factor: 100 >> 50 (at night, try to use only 1 unit for correction)   You may need 1-2 units of insulin when you wake up.  Listen to the [employer] podcast.   Please return in 4 months.

## 2023-07-10 NOTE — Addendum Note (Signed)
Addended by: Pollie Meyer on: 07/10/2023 08:13 AM   Modules accepted: Orders

## 2023-07-21 ENCOUNTER — Ambulatory Visit: Payer: BC Managed Care – PPO | Admitting: Family Medicine

## 2023-07-23 ENCOUNTER — Other Ambulatory Visit: Payer: Self-pay | Admitting: Internal Medicine

## 2023-07-23 DIAGNOSIS — E139 Other specified diabetes mellitus without complications: Secondary | ICD-10-CM

## 2023-07-28 ENCOUNTER — Other Ambulatory Visit: Payer: Self-pay | Admitting: Family Medicine

## 2023-07-28 DIAGNOSIS — Z1231 Encounter for screening mammogram for malignant neoplasm of breast: Secondary | ICD-10-CM

## 2023-08-25 ENCOUNTER — Encounter: Payer: Self-pay | Admitting: Family Medicine

## 2023-08-25 ENCOUNTER — Ambulatory Visit (INDEPENDENT_AMBULATORY_CARE_PROVIDER_SITE_OTHER): Payer: BC Managed Care – PPO | Admitting: Family Medicine

## 2023-08-25 VITALS — BP 100/62 | Temp 97.0°F | Ht 60.0 in | Wt 121.2 lb

## 2023-08-25 DIAGNOSIS — G629 Polyneuropathy, unspecified: Secondary | ICD-10-CM | POA: Diagnosis not present

## 2023-08-25 DIAGNOSIS — G2581 Restless legs syndrome: Secondary | ICD-10-CM

## 2023-08-25 DIAGNOSIS — Z789 Other specified health status: Secondary | ICD-10-CM

## 2023-08-25 DIAGNOSIS — E139 Other specified diabetes mellitus without complications: Secondary | ICD-10-CM | POA: Diagnosis not present

## 2023-08-25 DIAGNOSIS — Z0000 Encounter for general adult medical examination without abnormal findings: Secondary | ICD-10-CM

## 2023-08-25 LAB — CBC WITH DIFFERENTIAL/PLATELET
Basophils Absolute: 0 10*3/uL (ref 0.0–0.1)
Basophils Relative: 1 % (ref 0.0–3.0)
Eosinophils Absolute: 0 10*3/uL (ref 0.0–0.7)
Eosinophils Relative: 0.5 % (ref 0.0–5.0)
HCT: 40.7 % (ref 36.0–46.0)
Hemoglobin: 13.7 g/dL (ref 12.0–15.0)
Lymphocytes Relative: 16.8 % (ref 12.0–46.0)
Lymphs Abs: 0.5 10*3/uL — ABNORMAL LOW (ref 0.7–4.0)
MCHC: 33.7 g/dL (ref 30.0–36.0)
MCV: 91.7 fL (ref 78.0–100.0)
Monocytes Absolute: 0.3 10*3/uL (ref 0.1–1.0)
Monocytes Relative: 9.9 % (ref 3.0–12.0)
Neutro Abs: 2.1 10*3/uL (ref 1.4–7.7)
Neutrophils Relative %: 71.8 % (ref 43.0–77.0)
Platelets: 165 10*3/uL (ref 150.0–400.0)
RBC: 4.43 Mil/uL (ref 3.87–5.11)
RDW: 13.3 % (ref 11.5–15.5)
WBC: 2.9 10*3/uL — ABNORMAL LOW (ref 4.0–10.5)

## 2023-08-25 LAB — COMPREHENSIVE METABOLIC PANEL
ALT: 16 U/L (ref 0–35)
AST: 20 U/L (ref 0–37)
Albumin: 4.4 g/dL (ref 3.5–5.2)
Alkaline Phosphatase: 66 U/L (ref 39–117)
BUN: 8 mg/dL (ref 6–23)
CO2: 29 meq/L (ref 19–32)
Calcium: 9 mg/dL (ref 8.4–10.5)
Chloride: 93 meq/L — ABNORMAL LOW (ref 96–112)
Creatinine, Ser: 0.49 mg/dL (ref 0.40–1.20)
GFR: 100.38 mL/min (ref >60.00)
Glucose, Bld: 109 mg/dL — ABNORMAL HIGH (ref 70–99)
Potassium: 4.6 meq/L (ref 3.5–5.1)
Sodium: 129 meq/L — ABNORMAL LOW (ref 135–145)
Total Bilirubin: 0.5 mg/dL (ref 0.2–1.2)
Total Protein: 6.9 g/dL (ref 6.0–8.3)

## 2023-08-25 LAB — IBC + FERRITIN
Ferritin: 64.2 ng/mL (ref 10.0–291.0)
Iron: 118 ug/dL (ref 42–145)
Saturation Ratios: 37 % (ref 20.0–50.0)
TIBC: 319.2 ug/dL (ref 250.0–450.0)
Transferrin: 228 mg/dL (ref 212.0–360.0)

## 2023-08-25 LAB — LIPID PANEL
Cholesterol: 163 mg/dL (ref 0–200)
HDL: 88.4 mg/dL (ref >39.00)
LDL Cholesterol: 65 mg/dL (ref 0–99)
NonHDL: 74.12
Total CHOL/HDL Ratio: 2
Triglycerides: 47 mg/dL (ref 0.0–149.0)
VLDL: 9.4 mg/dL (ref 0.0–40.0)

## 2023-08-25 LAB — MICROALBUMIN / CREATININE URINE RATIO
Creatinine,U: 21.3 mg/dL
Microalb Creat Ratio: 3.3 mg/g (ref 0.0–30.0)
Microalb, Ur: <0.7 mg/dL (ref 0.0–1.9)

## 2023-08-25 LAB — VITAMIN B12: Vitamin B-12: 1380 pg/mL — ABNORMAL HIGH (ref 211–911)

## 2023-08-25 NOTE — Progress Notes (Signed)
Phone [phone]   Subjective:  Patient presents today for their annual physical. Chief complaint-noted.   See problem oriented charting- ROS- full  review of systems was completed and negative Per full ROS sheet completed by patient other than stress with caregiving, water like sensation in left ear intermittently- no hearing loss, moves legs at night and they can wake up fatigued  The following were reviewed and entered/updated in epic: Past Medical History:  Diagnosis Date   Anemia    Chicken pox    COVID    may 2022 cold s/s cough . blood sugars out of control.   COVID 10/2022   Depression    Resolved. in 36s- meds and counseling.   Diabetes mellitus (HCC)    Family history of hemochromatosis    father- screen iron   Gestational diabetes 09/08/1985   a1c q3 years   Hammer toe    Lichen planus    Sees [facility] dermatology - Dr. 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) 11/23/2018    Priority: High   Hyperlipidemia 08/04/2016    Priority: Medium    Fever blister 08/04/2016    Priority: Low   Depression     Priority: Low   Family history of hemochromatosis     Priority: Low   COVID-19 virus infection 01/25/2021   Hyponatremia 10/16/2020   Otalgia, right 06/27/2019   Rectocele 08/19/2018   Past Surgical History:  Procedure Laterality Date   DILATATION & CURETTAGE/HYSTEROSCOPY WITH MYOSURE N/A 03/03/2022   Procedure: hysterscopic myomectomy dilation and curretage;  Surgeon: Patton Salles, MD;  Location: [facility];  Service: Gynecology;  Laterality: N/A;  hysterscopic myomectomy dilation and curretage   ENDOMETRIAL ABLATION  2010   heavy menstrual bleeding   TONSILLECTOMY AND ADENOIDECTOMY  1965    Family History  Problem Relation Age of Onset   Osteoporosis Mother    Depression Mother         dealing with some memory issues   Glaucoma Mother    Dementia Mother    Hemochromatosis Father    Alcoholism Father    Hearing loss Brother        otosclerosis   Hypertension Brother    Hyperlipidemia Brother    Diabetes Brother        ? PE as well- she will verify   Hyperlipidemia Brother    Stroke Other        grandparent   Hypertension Other        grandparent   Colon cancer Neg Hx    Colon polyps Neg Hx    Esophageal cancer Neg Hx    Rectal cancer Neg Hx    Stomach cancer Neg Hx     Medications- reviewed and updated Current Outpatient Medications  Medication Sig Dispense Refill   Accu-Chek FastClix Lancets MISC Use as instructed 2x a day 204 each 3   ACCU-CHEK GUIDE test strip USE AS INSTRUCTED TO CHECK BLOOD SUGAR 4 TIMES A DAY - ACCUCHEK GUIDE 100 strip 3   acyclovir ointment (ZOVIRAX) 5 % Apply 1 application topically every 3 (three) hours. 30 g 1   BAQSIMI TWO PACK 3 MG/DOSE POWD Use as needed for hypoglycemia 1 each PRN   BD PEN NEEDLE NANO 2ND GEN 32G X 4 MM MISC USE 6-8 TIMES A DAY  FOR INSULIN 500 each 3   Blood Glucose Calibration (ACCU-CHEK GUIDE CONTROL) LIQD To calibrate Accu-Chek Guide meter 1 each 11   Cholecalciferol 50 MCG (2000 UT) CAPS Take 1 capsule by mouth daily.     Continuous Blood Gluc Sensor (DEXCOM G7 SENSOR) MISC 3 each by Does not apply route every 30 (thirty) days. Apply 1 sensor every 10 days 9 each 4   Insulin Aspart, w/Niacinamide, (FIASP PENFILL) 100 UNIT/ML SOCT USE UP TO 20 UNITS A DAY AS ADVISED 30 mL 0   insulin glargine (LANTUS SOLOSTAR) 100 UNIT/ML Solostar Pen Check on the skin up to 10 units daily, as advised.  To replace Basaglar. 30 mL 3   NOVOPEN ECHO DEVI Use as advised 1 each 3   triamcinolone cream (KENALOG) 0.1 % For autoimmune rash, not taking right now     vitamin B-12 (CYANOCOBALAMIN) 250 MCG tablet Take 250 mcg by mouth daily. Takes it once a week     tetrahydrozoline-zinc (VISINE-AC) 0.05-0.25 % ophthalmic solution  Place 1 drop into both eyes 3 (three) times daily as needed.     No current facility-administered medications for this visit.    Allergies-reviewed and updated Allergies  Allergen Reactions   Penicillins Rash   Sulfa Antibiotics Rash    Social History   Social History Narrative   Married. (husband [name] of Dr. Durene Cal). 2 children grown and married.   Son - 2 kids including oldest Willaim Rayas [address], son is 81 years old   Daughter- 46 year old granddaughter  youngest 2 in 2020 premature 2 months early (14 months old identical twin girls) 08/15/2019         Has had 12 moves in 32 years, most recently [place] for 1.5 years, [place] 5 years prior, [place] prior      B.A. In nutrition. Masters in [employer].  Has worked as a Comptroller.      Objective  Objective:  BP 100/62   Temp (!) 97 F (36.1 C)   Ht 5' (1.524 m)   Wt 121 lb 3.2 oz (55 kg)   LMP 09/09/2007 (Approximate) Comment: this was with her endometrial ablation  BMI 23.67 kg/m  Gen: NAD, resting comfortably HEENT: Mucous membranes are moist. Oropharynx normal Neck: no thyromegaly CV: RRR no murmurs rubs or gallops Lungs: CTAB no crackles, wheeze, rhonchi Abdomen: soft/nontender/nondistended/normal bowel sounds. No rebound or guarding.  Ext: no edema Skin: warm, dry Neuro: grossly normal, moves all extremities, PERRLA   Assessment and Plan   62 y.o. female presenting for annual physical.  Health Maintenance counseling: 1. Anticipatory guidance: Patient counseled regarding regular dental exams -q6 months, eye exams - yearly,  avoiding smoking and second hand smoke , limiting alcohol to 1 beverage per day- very rare social , no illicit drugs .   2. Risk factor reduction:  Advised patient of need for regular exercise and diet rich and fruits and vegetables to reduce risk of heart attack and stroke.  Exercise- got slowed down with some right shoulder pain and a pop but that has improved- daily walking  and twice a week for weight resistance for most part other than in last month with shoulder.  Diet/weight management-healthy weight within 4 lbs of last year, still healthy vegan diet- takes B12, vitamin D. Rich multicolored diet.  Wt Readings from Last 3 Encounters:  08/25/23 121 lb 3.2 oz (55 kg)  07/07/23 120 lb 3.2 oz (54.5 kg)  05/14/23 123 lb (55.8 kg)  3. Immunizations/screenings/ancillary studies-  opts out flu, COVID, Prevnar 20 - wait until 64  Immunization History  Administered Date(s) Administered   Influenza,inj,Quad PF,6+ Mos 07/31/2015, 08/04/2016, 08/05/2017, 08/11/2018, 06/20/2019, 08/21/2021   PFIZER Comirnaty(Gray Top)Covid-19 Tri-Sucrose Vaccine 12/24/2020   PFIZER(Purple Top)SARS-COV-2 Vaccination 11/18/2019, 12/09/2019, 06/07/2020   Pneumococcal Polysaccharide-23 03/09/2018   Tdap 07/31/2015   Zoster Recombinant(Shingrix) 03/09/2018, 08/11/2018  4. Cervical cancer screening- pap smear 12/10/2022 normal and HPV negative  5. Breast cancer screening-  breast exam with GYN typically and mammogram scheduled 08/27/2023 she prefers 2  typically  6. Colon cancer screening -colonoscopy 10/29/2021 with 5 year repeat planned  . Tortuous colon 7. Skin cancer screening-as needed dermatology follow up- was told even over 5 years Dr. Roderic Scarce. advised regular sunscreen use. Denies worrisome, changing, or new skin lesions.   8. Birth control/STD check- postmenopausal and monogamous   9. Osteoporosis screening at 53- discussed option of bone density before  64 as well- she prefers to wait   -Never smoker   Status of chronic or acute concerns   # Caregiver burden-last year was working with a therapist and reported helpful -currently has continued this. [place]   #Leg movement- has always noted at night- can bother husband. She is not aware- wakes up with some fatigue in her thights.   # LADA/insulin dependence-close follow-up with endocrinology and A1c has been  well-controlled-too soon for repeat but does need microalbumin to creatinine ratio today - also dealing with some neuropathy- will check B12 even on B12 Lab Results  Component Value Date   HGBA1C 6.2 (A) 07/07/2023   HGBA1C 6.0 (A) 03/04/2023   HGBA1C 6.0 (A) 10/15/2022   # Lipid discussion-excellent control LDL despite no statin-she prefers to remain off medicine but is aware of formal recommendations for patients with diabetes.  Previously discussed CT calcium scoring to look at Plaque deposition but she has opted out Lab Results  Component Value Date   CHOL 163 08/22/2022   HDL 86.10 08/22/2022   LDLCALC 66 08/22/2022   TRIG 55.0 08/22/2022   CHOLHDL 2 08/22/2022    # Mild memory issues last year-ongoing- wants to monitor- could be stress related   Recommended follow up: Return in about 1 year (around 08/24/2024) for physical or sooner if needed.Schedule b4 you leave. Can do earlier in year with switching insurance Future Appointments  Date Time Provider Department Center  08/27/2023  4:30 PM [facility] 3 [facility]  11/03/2023  1:00 PM Carlus Pavlov, MD [facility] None   Lab/Order associations:NOT fasting   ICD-10-CM   1. Preventative health care  Z00.00     2. Restless legs  G25.81 IBC + Ferritin    3. Vegan diet  Z78.9 Vitamin B12    4. Neuropathy  G62.9 Vitamin B12    5. 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 ratio    Comprehensive metabolic panel    CBC with Differential/Platelet    Lipid panel      No orders of the defined types were placed in this encounter.   Return precautions advised.  Tana Conch, MD

## 2023-08-25 NOTE — Patient Instructions (Addendum)
Please stop by lab before you go If you have mychart- we will send your results within 3 business days of [place] receiving them.  If you do not have mychart- we will call you about results within 5 business days of [place] receiving them.  *please also note that you will see labs on mychart as soon as they post. I will later go in and write notes on them- will say "notes from Dr. Durene Cal"    Recommended follow up: Return in about 1 year (around 08/24/2024) for physical or sooner if needed.Schedule b4 you leave. Can do earlier in year with switching insurance

## 2023-08-27 ENCOUNTER — Ambulatory Visit
Admission: RE | Admit: 2023-08-27 | Discharge: 2023-08-27 | Disposition: A | Discharge status: Home / Self Care | Payer: BC Managed Care – PPO | Source: Ambulatory Visit | Attending: Family Medicine | Admitting: Family Medicine

## 2023-08-27 DIAGNOSIS — Z1231 Encounter for screening mammogram for malignant neoplasm of breast: Secondary | ICD-10-CM

## 2023-09-05 ENCOUNTER — Other Ambulatory Visit: Payer: Self-pay | Admitting: Internal Medicine

## 2023-09-06 ENCOUNTER — Encounter: Payer: Self-pay | Admitting: Internal Medicine

## 2023-09-07 MED ORDER — DEXCOM G7 SENSOR MISC: 3.0000 | 4 refills | Status: AC

## 2023-09-14 ENCOUNTER — Telehealth: Payer: Self-pay

## 2023-09-14 ENCOUNTER — Other Ambulatory Visit (HOSPITAL_COMMUNITY): Payer: Self-pay

## 2023-09-14 NOTE — Telephone Encounter (Signed)
Patient picked samples.

## 2023-09-14 NOTE — Telephone Encounter (Signed)
 Sample  Device/Supplies: Dexcom G7 Quantity:3 MVH:[id number] EXP:08/07/24  Dicie Beam

## 2023-09-14 NOTE — Telephone Encounter (Signed)
 Pharmacy Patient Advocate Encounter   Received notification from Pt Calls Messages that prior authorization for Dexcom G7 sensor is required/requested.   Insurance verification completed.   The patient is insured through [facility] .   Per test claim: PA required; PA submitted to above mentioned insurance via CoverMyMeds Key/confirmation #/EOC [facility] Status is pending

## 2023-09-14 NOTE — Telephone Encounter (Signed)
 Pt needs a PA for Dexrom. Her insurance did change New Card is on file.

## 2023-09-18 NOTE — Telephone Encounter (Signed)
 Pharmacy Patient Advocate Encounter  Received notification from [facility] that Prior Authorization for Dexcom G7 sensor has been APPROVED through 09/12/2024   PA #/Case ID/Reference #: [id number]

## 2023-11-03 ENCOUNTER — Encounter: Payer: Self-pay | Admitting: Internal Medicine

## 2023-11-03 ENCOUNTER — Ambulatory Visit: Payer: 59 | Admitting: Internal Medicine

## 2023-11-03 VITALS — BP 120/70 | HR 59 | Ht 60.0 in | Wt 122.8 lb

## 2023-11-03 DIAGNOSIS — E785 Hyperlipidemia, unspecified: Secondary | ICD-10-CM | POA: Diagnosis not present

## 2023-11-03 DIAGNOSIS — E1065 Type 1 diabetes mellitus with hyperglycemia: Secondary | ICD-10-CM | POA: Diagnosis not present

## 2023-11-03 LAB — POCT GLYCOSYLATED HEMOGLOBIN (HGB A1C): Hemoglobin A1C: 6.1 % — AB (ref 4.0–5.6)

## 2023-11-03 NOTE — Patient Instructions (Addendum)
 Please use: - Lantus 3 (when more active)-4 units in am and 3 (when more active)-4 units at bedtime - FiAsp: B'fast: 1:10  Lunch: 1:15 (-20) Dinner: 1:15 (-20) - FiAsp sliding scale:  Target: 150 Correction factor: 50 (at night, try to use only 0.5-1 unit for correction) You may need 1-2 units of insulin when you wake up.   Try to reduce fluid intake by 12-24 oz.  Please return in 4 months.

## 2023-11-03 NOTE — Progress Notes (Signed)
 Patient ID: Brittany Barton, female   DOB: 1961/02/28, 63 y.o.   MRN: [id number]   HPI: Brittany Barton is a 63 y.o.-year-old female, initially referred by her PCP, Dr. Durene Cal, returning for follow-up for DM1, GDM in 1987, dx in 07/2017 as DM2 and in 11/2018 as DM1, insulin-dependent, well controlled, without long-term complications.  Last visit 4 months ago.  Interim history: No increased urination, blurry vision, chest pain. She has increased stress, taking care of her parents.  She does have some mild memory problems.  Also, she has restless leg syndrome. She had right shoulder pain and had less exercise since last visit but restarted.  Reviewed HbA1c levels: Lab Results  Component Value Date   HGBA1C 6.2 (A) 07/07/2023   HGBA1C 6.0 (A) 03/04/2023   HGBA1C 6.0 (A) 10/15/2022   HGBA1C 5.9 (A) 06/13/2022   HGBA1C 5.6 02/10/2022   HGBA1C 6.1 (A) 10/21/2021   HGBA1C 5.7 (A) 06/18/2021   HGBA1C 5.8 (A) 02/19/2021   HGBA1C 5.9 (A) 10/16/2020   HGBA1C 5.8 (A) 06/12/2020   She is on: - Lantus 3 (when more active)-4 units in am and 3 (when more active)-4 units at bedtime - FiAsp: B'fast: 1:10  Lunch: 1:15 (-20) Dinner: 1:15 (-20) - FiAsp sliding scale:  Target: 150 Correction factor: 100 >> 50 (at night, try to use only 1 unit for correction) You may need 1-2 units of insulin when you wake up. Inject FiAsp 10-15 min before coffee. Previously on Lantus, Humalog, and Lyumjev.  She checks her sugars more than 4 times a day with her Dexcom CGM:  Previously:   Prev.   Lowest sugar was 40 >> ...   52 >> 49 >> 52 >> 50s; she has hypoglycemia awareness at 70.  She keeps glucose tablets on the nightstand and juice in the fridge. Highest sugar was 330 >> 200s >> 300s >> 200s.  Glucometer: Accuchek   She continues on a plant-based diet without high glycemic index foods. In 02/2015 she started a plant-based diet and lost more than 80 pounds.  Pt's meals are: - Breakfast:  Raw/soaked oatmeal, blueberries or other fruit, ground flaxseed, walnuts (2 tablespoons), soy milk - Lunch: Beans, whole grains, leafy greens, other vegetables and fruit - Dinner: Salad with beans/peas or vegetable-based soup. - Snacks: 0-2-fruit, veggies, homemade bean dip, PB She uses Chronometer app for carb counting.  -No CKD, last BUN/creatinine:  Lab Results  Component Value Date   BUN 8 08/25/2023   BUN 7 08/22/2022   CREATININE 0.49 08/25/2023   CREATININE 0.49 08/22/2022   Lab Results  Component Value Date   MICRALBCREAT 3.3 08/25/2023  Not on ACE inhibitor/ARB.  -+ History of HL, diet controlled; last set of lipids: Lab Results  Component Value Date   CHOL 163 08/25/2023   HDL 88.40 08/25/2023   LDLCALC 65 08/25/2023   TRIG 47.0 08/25/2023   CHOLHDL 2 08/25/2023  She declined statins.  - last eye exam was 02/13/2023: No DR  - NO numbness and tingling in her feet. Last foot exam: 06/2023.  Pt has FH of DM in MGM.  Hyponatremia: She has a history of low sodium: Lab Results  Component Value Date   NA 129 (L) 08/25/2023   NA 131 (L) 08/22/2022   NA 131 (L) 03/03/2022   NA 129 (L) 08/21/2021   NA 133 (L) 02/19/2021   NA 130 (L) 08/16/2020   NA 126 (L) 08/29/2019   NA 126 (L) 08/15/2019   NA  130 (L) 03/17/2019   NA 133 (L) 11/08/2018   NA 129 (L) 08/04/2018   NA 134 (L) 08/06/2017   NA 134 (L) 07/29/2016   NA 136 07/31/2015   NA 139 05/24/2013   Investigation for hyponatremia reviewed: Component     Latest Ref Rng & Units 08/16/2020  Glucose     65 - 99 mg/dL 60 (L)  BUN     7 - 25 mg/dL 8  Creatinine     1.61 - 0.99 mg/dL 0.96  GFR, Est Non African American     > OR = 60 mL/min/1.78m2 105  Sodium     135 - 146 mmol/L 130 (L)  Potassium     3.5 - 5.3 mmol/L 4.6  Chloride     98 - 110 mmol/L 94 (L)  Osmolality, Urine     50 - 1,200 mOsm/kg 142  Osmolality     278 - 305 mOsm/kg 268 (L)  Sodium, Urine     28 - 272 mmol/L 33   Plasma sodium  was 130, slightly low >> hyponatremia Plasma osmolality was 268, lower than 275-280 >> hypoosmolar hyponatremia Urine sodium was 33, not very low >> ruling out excessive fluid losses (chronic diuretics, GI losses, excessive perspiration) or water overload Urine osmolality was 142, higher than 100 >> inappropriately high for hyponatremia  We ruled out adrenal insufficiency by a normal cosyntropin stimulation test: Component     Latest Ref Rng & Units 11/30/2018 11/30/2018 11/30/2018         8:15 AM  8:59 AM  9:26 AM  Cortisol, Plasma     ug/dL 04.5 40.9 81.1  B147 ACTH     6 - 50 pg/mL 15     She does not have hypothyroidism: Lab Results  Component Value Date   TSH 1.07 11/08/2018   TSH 1.36 07/29/2016   TSH 2.41 07/31/2015   She is not on HCTZ, carbamazepine, amitriptyline, SSRIs, opiates. No history of brain injury. No history of generalized infections. No history of malignancy. No recent brain or chest imaging. No chronic kidney disease.  At previous visits, I advised her to try to reduce her fluid intake to 1 L a day.  She was able to limit her intake to 1.5 L a day, but this was difficult for her to do. She had a lot of fluid retention during her COVID-19 episode from 01/2021 and after taking the antiviral medication she eliminated this (approximately 7 to 8 pounds).  Mother also has a history of hyponatremia. Brother with otosclerosis.  ROS: + See HPI  I reviewed pt's medications, allergies, PMH, social hx, family hx, and changes were documented in the history of present illness. Otherwise, unchanged from my initial visit note.  Past Medical History:  Diagnosis Date   Anemia    Chicken pox    COVID    may 2022 cold s/s cough . blood sugars out of control.   COVID 10/2022   Depression    Resolved. in 21s- meds and counseling.   Diabetes mellitus (HCC)    Family history of hemochromatosis    father- screen iron   Gestational diabetes 09/08/1985   a1c q3 years    Hammer toe    Lichen planus    Sees [facility] dermatology - Dr. Sebastian Ache   Morton's neuroma    per records,pt.denies 10/15/21   Wears glasses    Past Surgical History:  Procedure Laterality Date   DILATATION & CURETTAGE/HYSTEROSCOPY WITH MYOSURE N/A 03/03/2022  Procedure: hysterscopic myomectomy dilation and curretage;  Surgeon: Patton Salles, MD;  Location: [facility];  Service: Gynecology;  Laterality: N/A;  hysterscopic myomectomy dilation and curretage   ENDOMETRIAL ABLATION  2010   heavy menstrual bleeding   TONSILLECTOMY AND ADENOIDECTOMY  1965   Social History   Socioeconomic History   Marital status: Married    Spouse name: Not on file   Number of children: 2   Years of education: Not on file   Highest education level: Not on file  Occupational History    Librarian  Social Needs   Financial resource strain: Not on file   Food insecurity:    Worry: Not on file    Inability: Not on file   Transportation needs:    Medical: Not on file    Non-medical: Not on file  Tobacco Use   Smoking status: Never Smoker   Smokeless tobacco: Never Used  Substance and Sexual Activity   Alcohol use: Yes    Alcohol/week:  2-4 drinks per week    Types:  Wine/beer   Drug use: No   Sexual activity: Yes  Lifestyle   Physical activity: Walk/hike/spin class/weight resistance/stretching    Days per week: 7    Minutes per session: Not on file  Relationships  Social History Narrative   Married. (husband Fayrene Fearing, patient of Dr. Durene Cal). 2 children grown and married. B/c grandmother December 2016.    Has had 12 moves in 32 years, most recently [place] for 1.5 years, [place] 5 years prior, [place] prior      [place]. In nutrition. Masters in [employer]. In between jobs as Comptroller.       Current Outpatient Medications on File Prior to Visit  Medication Sig Dispense Refill   Accu-Chek FastClix Lancets MISC Use as instructed 2x a day 204 each 3   ACCU-CHEK  GUIDE test strip USE AS INSTRUCTED TO CHECK BLOOD SUGAR 4 TIMES A DAY - ACCUCHEK GUIDE 100 strip 3   acyclovir ointment (ZOVIRAX) 5 % Apply 1 application topically every 3 (three) hours. 30 g 1   BAQSIMI TWO PACK 3 MG/DOSE POWD Use as needed for hypoglycemia 1 each PRN   BD PEN NEEDLE NANO 2ND GEN 32G X 4 MM MISC USE 6-8 TIMES A DAY FOR INSULIN 500 each 3   Blood Glucose Calibration (ACCU-CHEK GUIDE CONTROL) LIQD To calibrate Accu-Chek Guide meter 1 each 11   Cholecalciferol 50 MCG (2000 UT) CAPS Take 1 capsule by mouth daily.     Continuous Glucose Sensor (DEXCOM G7 SENSOR) MISC 3 each by Does not apply route every 30 (thirty) days. Apply 1 sensor every 10 days 9 each 4   Insulin Aspart, w/Niacinamide, (FIASP PENFILL) 100 UNIT/ML SOCT USE UP TO 20 UNITS A DAY AS ADVISED 30 mL 0   insulin glargine (LANTUS SOLOSTAR) 100 UNIT/ML Solostar Pen Check on the skin up to 10 units daily, as advised.  To replace Basaglar. 30 mL 3   NOVOPEN ECHO DEVI Use as advised 1 each 3   triamcinolone cream (KENALOG) 0.1 % For autoimmune rash, not taking right now     vitamin B-12 (CYANOCOBALAMIN) 250 MCG tablet Take 250 mcg by mouth daily. Takes it once a week     No current facility-administered medications on file prior to visit.   Allergies  Allergen Reactions   Penicillins Rash   Sulfa Antibiotics Rash   Family History  Problem Relation Age of Onset   Osteoporosis Mother  Depression Mother        dealing with some memory issues   Glaucoma Mother    Dementia Mother    Hemochromatosis Father    Alcoholism Father    Hearing loss Brother        otosclerosis   Hypertension Brother    Hyperlipidemia Brother    Diabetes Brother        ? PE as well- she will verify   Hyperlipidemia Brother    Stroke Other        grandparent   Hypertension Other        grandparent   Colon cancer Neg Hx    Colon polyps Neg Hx    Esophageal cancer Neg Hx    Rectal cancer Neg Hx    Stomach cancer Neg Hx    PE: LMP  09/09/2007 (Approximate) Comment: this was with her endometrial ablation Wt Readings from Last 3 Encounters:  08/25/23 121 lb 3.2 oz (55 kg)  07/07/23 120 lb 3.2 oz (54.5 kg)  05/14/23 123 lb (55.8 kg)   Constitutional: normal weight, in NAD Eyes:  EOMI, no exophthalmos ENT: no neck masses, no cervical lymphadenopathy Cardiovascular: RRR, No MRG Respiratory: CTA B Musculoskeletal: no deformities Skin:no rashes Neurological: no tremor with outstretched hands  ASSESSMENT: 1.  LADA, controlled, without long-term complications but with occasional hyperglycemia  She had low C-peptide and high GAD antibodies: Component     Latest Ref Rng & Units 03/17/2019  Glucose     65 - 99 mg/dL 347 (H)  C-Peptide     0.80 - 3.85 ng/mL 0.55 (L)   Component     Latest Ref Rng & Units 11/08/2018  Glucose     70 - 99 mg/dL 425 (H)  C-Peptide     0.80 - 3.85 ng/mL 0.55 (L)  Glutamic Acid Decarb Ab     <5 IU/mL >250 (H)  Insulin Antibodies, Human     <0.4 U/mL 3.6 (H)   Component     Latest Ref Rng & Units 11/30/2018  ZNT8 Antibodies     U/mL <15  Islet Cell Ab     Neg:<1:1 Negative   2. HL  3. Hyponatremia -She is asymptomatic: no headaches, mental, confusion -Her sodium usually fluctuates between 126 and 133-mild hyponatremia.  Latest level was reviewed from last visit with PCP and this was slightly low, at 131, stable. 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 -After fluid restriction, if sodium level = normal >> most likely SIADH.  If sodium = still low >> possible reset osmostat.  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  PLAN:  1. Patient with history of type 1 diabetes, diagnosed in 2020.  She is on a plant-based diet with low glycemic index foods and she usually exercises consistently.She was able to avoid starting insulin for a long time but we ended up adding mealtime insulin in 2020 and long-acting insulin in 2021.  She is very insulin sensitive and we are using very low doses of insulin for her.   -At last  visit, sugars were fluctuating mostly within the target range with only occasional hyperglycemic values occasionally after dinner and less frequently with the other meals.  We discussed at length about correct carb counting (I advised her to try to subtract the fiber, but I did not recommend to account for protein and fat yet). Also, we discussed about lag time (advised her to bolus slightly and advanced if the sugars are high before certain meal), I explained the parameters in the sliding scale upon her questioning (how to take this into account and also we decided to strengthen her sensitivity factor from 100 to 50), we discussed about continuing with the same doses of Lantus decreasing the dose when she is more active and increasing the dose when she is less active, but for now I did not feel that she needs different doses, by the review of her CGM data.  I advised her to continue to bolus 1 to 2 units of insulin in the morning, before coffee, if the sugars are elevated especially.  Otherwise, I did not suggest changes in her regimen especially as she feels that the latest 2 weeks of data were a little bit higher than she is used to due to increased stress and some dietary indiscretions. -At last visit I sent a prescription for lancets.  I also gave her further references regarding type 1 diabetes management. CGM interpretation: -At today's visit, we reviewed her CGM downloads: It appears that 82% of values are in target range (goal >70%),  while 15% are higher than 180 (goal <25%), and 3% are lower than 70 (goal <4%).  The calculated average blood sugar is 131.  The projected HbA1c for the next 3 months (GMI) is 6.4%. -Reviewing the CGM trends, sugars are still fluctuating within the target range with only occasional hyperglycemic values especially after dinner, but they are not very frequent.  She does have occasional low blood sugars after dinner and in the middle of the night. -At today's visit, we review together individual tracings, also, and it appears that the low blood sugars during the night appear when she is bolusing too much for correction of a high blood sugar at night.  At last visit I advised her to use no more than 1 unit for correction at night but she sometimes uses 1.5 to 2 units and is almost invariably drops are too low.  I advised her not to use more than 0.5-1 unit for correction.  If the sugars remain low overnight after these changes, I advised her to reduce the Lantus at night to only 3 units.  Otherwise, we can continue the same Fiasp doses.  She does have a FiAsp pen with cartridges and she can use half units if needed. - I advised her to: Patient Instructions  Please use: - Lantus 3 (when more active)-4 units in am and 3 (when more active)-4 units at bedtime - FiAsp: B'fast: 1:10  Lunch: 1:15 (-20) Dinner: 1:15 (-20) - FiAsp sliding scale:  Target: 150 Correction factor: 50 (at night, try to use only 0.5-1 unit for correction) You may need 1-2 units of insulin when you wake up.   Try to reduce fluid intake by 12-24 oz.  Please return in 4 months.  - we checked her HbA1c: 6.1% (slightly lower) - advised to check sugars at different times of the day - 4x a day, rotating check times - advised for yearly eye exams >> she is UTD - return to clinic in 4 months  2. HL -Latest lipid panel was reviewed from 08/2023: All fractions at goal: Lab Results  Component Value Date   CHOL 163 08/25/2023   HDL  88.40 08/25/2023   LDLCALC 65 08/25/2023   TRIG 47.0 08/25/2023   CHOLHDL 2 08/25/2023  -She is not on a statin but has excellent lipid fractions, on a plant-based diet  3.  Hyponatremia -We reviewed together the latest sodium level and this was lower than before, at 129 -Upon questioning, she is drinking 72 ounces of fluids daily (2.1 L).  We discussed that ideally she would reduce this to 1 L but I advised her to start by reducing her daily intake by 12 ounces and then by 24 ounces, if possible.  She agrees to do so.  Carlus Pavlov, MD PhD [facility] Endocrinology

## 2023-11-18 ENCOUNTER — Emergency Department (HOSPITAL_BASED_OUTPATIENT_CLINIC_OR_DEPARTMENT_OTHER): Admitting: Radiology

## 2023-11-18 ENCOUNTER — Encounter (HOSPITAL_BASED_OUTPATIENT_CLINIC_OR_DEPARTMENT_OTHER): Payer: Self-pay | Admitting: Emergency Medicine

## 2023-11-18 ENCOUNTER — Ambulatory Visit: Payer: Self-pay | Admitting: Family Medicine

## 2023-11-18 ENCOUNTER — Emergency Department (HOSPITAL_BASED_OUTPATIENT_CLINIC_OR_DEPARTMENT_OTHER)
Admission: EM | Admit: 2023-11-18 | Discharge: 2023-11-18 | Disposition: A | Discharge status: Home / Self Care | Attending: Emergency Medicine | Admitting: Emergency Medicine

## 2023-11-18 ENCOUNTER — Other Ambulatory Visit: Payer: Self-pay

## 2023-11-18 DIAGNOSIS — R0789 Other chest pain: Secondary | ICD-10-CM | POA: Insufficient documentation

## 2023-11-18 DIAGNOSIS — E1140 Type 2 diabetes mellitus with diabetic neuropathy, unspecified: Secondary | ICD-10-CM | POA: Diagnosis not present

## 2023-11-18 DIAGNOSIS — Z794 Long term (current) use of insulin: Secondary | ICD-10-CM | POA: Diagnosis not present

## 2023-11-18 DIAGNOSIS — R079 Chest pain, unspecified: Secondary | ICD-10-CM

## 2023-11-18 DIAGNOSIS — G6289 Other specified polyneuropathies: Secondary | ICD-10-CM

## 2023-11-18 LAB — BASIC METABOLIC PANEL
Anion gap: 10 (ref 5–15)
BUN: 9 mg/dL (ref 8–23)
CO2: 28 mmol/L (ref 22–32)
Calcium: 8.9 mg/dL (ref 8.9–10.3)
Chloride: 95 mmol/L — ABNORMAL LOW (ref 98–111)
Creatinine, Ser: 0.55 mg/dL (ref 0.44–1.00)
GFR, Estimated: >60 mL/min (ref >60)
Glucose, Bld: 106 mg/dL — ABNORMAL HIGH (ref 70–99)
Potassium: 4.1 mmol/L (ref 3.5–5.1)
Sodium: 133 mmol/L — ABNORMAL LOW (ref 135–145)

## 2023-11-18 LAB — CBC
HCT: 40.1 % (ref 36.0–46.0)
Hemoglobin: 13.9 g/dL (ref 12.0–15.0)
MCH: 30.9 pg (ref 26.0–34.0)
MCHC: 34.7 g/dL (ref 30.0–36.0)
MCV: 89.1 fL (ref 80.0–100.0)
Platelets: 170 10*3/uL (ref 150–400)
RBC: 4.5 MIL/uL (ref 3.87–5.11)
RDW: 12.9 % (ref 11.5–15.5)
WBC: 3.7 10*3/uL — ABNORMAL LOW (ref 4.0–10.5)
nRBC: 0 % (ref 0.0–0.2)

## 2023-11-18 LAB — TROPONIN I (HIGH SENSITIVITY)
Troponin I (High Sensitivity): 2 ng/L (ref <18)
Troponin I (High Sensitivity): 3 ng/L (ref <18)

## 2023-11-18 NOTE — ED Provider Notes (Signed)
 [place] EMERGENCY DEPARTMENT AT [facility] Provider Note   CSN: [id number] Arrival date & time: 11/18/23  1116     History  Chief Complaint  Patient presents with   Chest Pain    Brittany Barton is a 63 y.o. female.  63 year old female with history of insulin-dependent diabetes who presents to the emergency department with chest discomfort and left hand numbness.  Patient reports that over the past 4 days she has had chest pressure.  4/10 in severity at its worst.  Currently 2/10 in severity.  Substernal.  No radiation.  Not worsened with exertion or movement.  Says that occasionally it is worse with palpation.  Also reports that during this time she has had numbness of her left hand.  Does not radiate up her arm at all.  No weakness, vision changes, facial droop.  No headache or neck pain.       Home Medications Prior to Admission medications   Medication Sig Start Date End Date Taking? Authorizing Provider  Accu-Chek FastClix Lancets MISC Use as instructed 2x a day 07/07/23   Carlus Pavlov, MD  ACCU-CHEK GUIDE test strip USE AS INSTRUCTED TO CHECK BLOOD SUGAR 4 TIMES A DAY - ACCUCHEK GUIDE 07/03/23   Carlus Pavlov, MD  acyclovir ointment (ZOVIRAX) 5 % Apply 1 application topically every 3 (three) hours. 08/05/17   Shelva Majestic, MD  BAQSIMI TWO PACK 3 MG/DOSE POWD Use as needed for hypoglycemia 06/18/21   Carlus Pavlov, MD  BD PEN NEEDLE NANO 2ND GEN 32G X 4 MM MISC USE 6-8 TIMES A DAY FOR INSULIN 03/02/23   Carlus Pavlov, MD  Blood Glucose Calibration (ACCU-CHEK GUIDE CONTROL) LIQD To calibrate Accu-Chek Guide meter 06/18/21   Carlus Pavlov, MD  Cholecalciferol 50 MCG (2000 UT) CAPS Take 1 capsule by mouth daily.    [provider]  Continuous Glucose Sensor (DEXCOM G7 SENSOR) MISC 3 each by Does not apply route every 30 (thirty) days. Apply 1 sensor every 10 days 09/07/23   Carlus Pavlov, MD  ferrous sulfate (FEROSUL) 325 (65  FE) MG tablet  08/30/23   [provider]  Insulin Aspart, w/Niacinamide, (FIASP PENFILL) 100 UNIT/ML SOCT USE UP TO 20 UNITS A DAY AS ADVISED 07/23/23   Carlus Pavlov, MD  insulin glargine (LANTUS SOLOSTAR) 100 UNIT/ML Solostar Pen Check on the skin up to 10 units daily, as advised.  To replace Basaglar. 09/10/22   Carlus Pavlov, MD  NOVOPEN ECHO DEVI Use as advised 06/13/22   Carlus Pavlov, MD  triamcinolone cream (KENALOG) 0.1 % For autoimmune rash, not taking right now 12/31/21   [provider]  vitamin B-12 (CYANOCOBALAMIN) 250 MCG tablet Take 250 mcg by mouth daily. Takes it once a week    [provider]      Allergies    Penicillins and Sulfa antibiotics    Review of Systems   Review of Systems  Physical Exam Updated Vital Signs BP 123/86 (BP Location: Left Arm)   Pulse (!) 59   Temp 99 F (37.2 C)   Resp 16   LMP 09/09/2007 (Approximate) Comment: this was with her endometrial ablation  SpO2 100%  Physical Exam Vitals and nursing note reviewed.  Constitutional:      General: She is not in acute distress.    Appearance: She is well-developed.  HENT:     Head: Normocephalic and atraumatic.     Right Ear: External ear normal.     Left Ear: External ear  normal.     Nose: Nose normal.  Eyes:     Extraocular Movements: Extraocular movements intact.     Conjunctiva/sclera: Conjunctivae normal.     Pupils: Pupils are equal, round, and reactive to light.  Cardiovascular:     Rate and Rhythm: Normal rate and regular rhythm.     Heart sounds: No murmur heard.    Comments: Chest pain not reproducible Pulmonary:     Effort: Pulmonary effort is normal. No respiratory distress.     Breath sounds: Normal breath sounds.  Musculoskeletal:     Cervical back: Normal range of motion and neck supple.     Right lower leg: No edema.     Left lower leg: No edema.  Skin:    General: Skin is warm and dry.  Neurological:     Mental Status: She is  alert and oriented to person, place, and time. Mental status is at baseline.     Comments: MENTAL STATUS: AAOx3 CRANIAL NERVES: II: Pupils equal and reactive 4 mm BL, no RAPD, no VF deficits III, IV, VI: EOM intact, no gaze preference or deviation, no nystagmus. V: normal sensation to light touch in V1, V2, and V3 segments bilaterally VII: no facial weakness or asymmetry, no nasolabial fold flattening VIII: normal hearing to speech and finger friction IX, X: normal palatal elevation, no uvular deviation XI: 5/5 head turn and 5/5 shoulder shrug bilaterally XII: midline tongue protrusion MOTOR: 5/5 strength in R shoulder flexion, elbow flexion and extension, and grip strength. 5/5 strength in L shoulder flexion, elbow flexion and extension, and grip strength.  5/5 strength in R hip and knee flexion, knee extension, ankle plantar and dorsiflexion. 5/5 strength in L hip and knee flexion, knee extension, ankle plantar and dorsiflexion. SENSORY: Normal sensation to light touch in all extremities but does endorse mildly decreased sensation to light touch in median, radial, and ulnar distribution of the left hand. COORD: Normal finger to nose and heel to shin, no tremor, no dysmetria STATION: normal stance, no truncal ataxia  Psychiatric:        Mood and Affect: Mood normal.     ED Results / Procedures / Treatments   Labs (all labs ordered are listed, but only abnormal results are displayed) Labs Reviewed  BASIC METABOLIC PANEL - Abnormal; Notable for the following components:      Result Value   Sodium 133 (*)    Chloride 95 (*)    Glucose, Bld 106 (*)    All other components within normal limits  CBC - Abnormal; Notable for the following components:   WBC 3.7 (*)    All other components within normal limits  TROPONIN I (HIGH SENSITIVITY)  TROPONIN I (HIGH SENSITIVITY)    EKG EKG Interpretation Date/Time:  Wednesday November 18 2023 11:22:15 EDT Ventricular Rate:  56 PR  Interval:  134 QRS Duration:  94 QT Interval:  400 QTC Calculation: 386 R Axis:   92  Text Interpretation: Sinus bradycardia Rightward axis Nonspecific ST and T wave abnormality Abnormal ECG Confirmed by Vonita Moss [phone]) on 11/18/2023 11:33:39 AM  Radiology DG Chest 2 View Result Date: 11/18/2023 CLINICAL DATA:  Chest pain. EXAM: CHEST - 2 VIEW COMPARISON:  None Available. FINDINGS: Bilateral lung fields are clear. Bilateral costophrenic angles are clear. Normal cardio-mediastinal silhouette. No acute osseous abnormalities. The soft tissues are within normal limits. IMPRESSION: No active cardiopulmonary disease. Electronically Signed   By: Jules Schick M.D.   On: 11/18/2023 13:47  Procedures Procedures    Medications Ordered in ED Medications - No data to display  ED Course/ Medical Decision Making/ A&P                                 Medical Decision Making Amount and/or Complexity of Data Reviewed Labs: ordered. Radiology: ordered.   Brittany Barton is a 63 y.o. female with comorbidities that complicate the patient evaluation including insulin-dependent diabetes and diabetic neuropathy who presents to the emergency department chest pain and left hand numbness  Initial Ddx:  MI, PE, pneumonia, dissection, pericarditis, costochondritis, reflux  MDM:  With the patient's chest discomfort will obtain EKG and troponins to evaluate for MI.  No SOB or cough to suggest PE.  Considered dissection but with their symmetric pulses, history, and description of the pain feel it is less likely.  If chest x-ray reveals widened mediastinum or any other concerning findings will consider CTA.  Also considered pericarditis but description is unlikely and they do not have risk factors for this diagnosis.  Chest pain not reproducible so feel it costochondritis less likely.  No infectious symptoms to suggest pneumonia at this time that would be causing pleuritic chest pain.   Plan:   Labs Troponin EKG Chest x-ray  ED Summary/Re-evaluation:  Chest x-ray without acute findings.  EKG with nonspecific ST abnormalities.  Initial troponin was 2 and repeat was 3.  Feel that ACS is highly unlikely.  Patient was complaining of some numbness in her left hand.  Does have history of peripheral neuropathy in her legs and with her diabetes feel that it may now be affecting her hand.  Does not appear to be in a dermatomal distribution.  On exam still does have sensation to light touch making stroke less likely.  No other neurologic deficits.  Will have her follow-up with her primary doctor and cardiology as an outpatient.  This patient presents to the ED for concern of complaints listed in HPI, this involves an extensive number of treatment options, and is a complaint that carries with it a high risk of complications and morbidity. Disposition including potential need for admission considered.   Dispo: DC Home. Return precautions discussed including, but not limited to, those listed in the AVS. Allowed pt time to ask questions which were answered fully prior to dc.  Additional history obtained from spouse Records reviewed Outpatient Clinic Notes The following labs were independently interpreted: Chemistry and show no acute abnormality I independently reviewed the following imaging with scope of interpretation limited to determining acute life threatening conditions related to emergency care: Chest x-ray and agree with the radiologist interpretation with the following exceptions: none I personally reviewed and interpreted cardiac monitoring: normal sinus rhythm  I personally reviewed and interpreted the pt's EKG: see above for interpretation  I have reviewed the patients home medications and made adjustments as needed   Final Clinical Impression(s) / ED Diagnoses Final diagnoses:  Chest pain, unspecified type  Other polyneuropathy    Rx / DC Orders ED Discharge Orders           Ordered    Ambulatory referral to Cardiology        11/18/23 1424              Rondel Baton, MD 11/18/23 1428

## 2023-11-18 NOTE — Telephone Encounter (Signed)
 Chief Complaint: Chest pain Symptoms: Numbness in left hand, fatigue Frequency: Intermittent Pertinent Negatives: Patient denies dizziness, headache, difficulty breathing Disposition: [x] ED  Additional Notes: Pt states she has intermittent chest pain/pressure for 3-4 days. Pt also has intermittent numbness in left hand for at least a week. Pt feels very fatigued. Pt did take a baby aspirin. Pt advised to go to ED and states her husband is almost home to take her. Pt agreeable to plan and stands understanding.   Copied from CRM [id number]. Topic: Clinical - Red Word Triage >> Nov 18, 2023 10:45 AM Denese Killings wrote: Red Word that prompted transfer to Nurse Triage: Patient has numbness in her left hand and chest pain(pressure). Reason for Disposition  [1] Chest pain lasts > 5 minutes AND [2] age > 34  Answer Assessment - Initial Assessment Questions 1. LOCATION: "Where does it hurt?"       Right in middle of chest, feels like pressure 2. RADIATION: "Does the pain go anywhere else?" (e.g., into neck, jaw, arms, back)     Denies 3. ONSET: "When did the chest pain begin?" (Minutes, hours or days)      3-4 days ago 4. PATTERN: "Does the pain come and go, or has it been constant since it started?"  "Does it get worse with exertion?"      Intermittent 5. DURATION: "How long does it last" (e.g., seconds, minutes, hours)     "This time about an hour" before maybe 15 minutes or so 6. SEVERITY: "How bad is the pain?"  (e.g., Scale 1-10; mild, moderate, or severe)    - MILD (1-3): doesn't interfere with normal activities     - MODERATE (4-7): interferes with normal activities or awakens from sleep    - SEVERE (8-10): excruciating pain, unable to do any normal activities       3-4; 1-2 right now 7.  OTHER SYMPTOMS: "Do you have any other symptoms?" (e.g., dizziness, nausea, vomiting, sweating, fever, difficulty breathing, cough)       Denies  Protocols used: Chest Pain-A-AH

## 2023-11-18 NOTE — Discharge Instructions (Addendum)
You were seen for your chest pain in the emergency department.   At home, please take tylenol for your pain.    Follow-up with your primary doctor in 2-3 days regarding your visit.  Cardiology will be calling you regarding an appointment within the next 72 hours.  You may contact them if you do not hear from them in that time using the information in this packet.  Return immediately to the emergency department if you experience any of the following: Worsening pain, difficulty breathing, unexplained vomiting or sweating, or any other concerning symptoms.    Thank you for visiting our Emergency Department. It was a pleasure taking care of you today.

## 2023-11-18 NOTE — Telephone Encounter (Signed)
 FYI

## 2023-11-18 NOTE — Telephone Encounter (Signed)
 Noted thanks

## 2023-11-18 NOTE — ED Triage Notes (Signed)
 Pt c/o central CP and LT arm numbness 1 hr pta. Reports similar symptoms x 4 on prior days. Pt denies shob

## 2024-01-07 ENCOUNTER — Encounter (HOSPITAL_BASED_OUTPATIENT_CLINIC_OR_DEPARTMENT_OTHER): Payer: Self-pay | Admitting: Cardiology

## 2024-01-07 ENCOUNTER — Ambulatory Visit (HOSPITAL_BASED_OUTPATIENT_CLINIC_OR_DEPARTMENT_OTHER): Admitting: Cardiology

## 2024-01-07 VITALS — BP 132/84 | HR 60 | Ht 60.6 in | Wt 123.4 lb

## 2024-01-07 DIAGNOSIS — Z7189 Other specified counseling: Secondary | ICD-10-CM

## 2024-01-07 DIAGNOSIS — E1065 Type 1 diabetes mellitus with hyperglycemia: Secondary | ICD-10-CM

## 2024-01-07 DIAGNOSIS — R072 Precordial pain: Secondary | ICD-10-CM

## 2024-01-07 DIAGNOSIS — Z8349 Family history of other endocrine, nutritional and metabolic diseases: Secondary | ICD-10-CM

## 2024-01-07 DIAGNOSIS — R079 Chest pain, unspecified: Secondary | ICD-10-CM | POA: Diagnosis not present

## 2024-01-07 NOTE — Progress Notes (Signed)
 Cardiology Office Note:  .   Date:  01/07/2024  ID:  Brittany Barton, DOB 1961/08/31, MRN [id number] PCP: Almira Jaeger, MD  [facility] Providers Cardiologist:  Sheryle Donning, MD {  History of Present Illness: .   Brittany Barton is a 63 y.o. female type I diabetes on insulin  with neuropathy. She is seen as a new consult at the request of Dr. Efraim Grange for chest pain.  Today: Referred from Dr. Efraim Grange (ER) due to ER visit for chest pain 11/18/23. Noted 4 day history of chest pressure and left hand numbness, substernal, no radiation, not affected by exertion, somewhat worse with palpation. ECG nonspecific. Hx Tn normal at 2, 3. Referred to cardiology for further evaluation.  Chest pain: -Initial onset: had been going on for a few days, then escalated and was associated with left arm/had numbness.  -Quality: central, pressure, nonradiating -Frequency: hadn't had a similar episode in many years, was told it might be pleurisy in the past -Duration: several days total -Associated symptoms: left arm/hand numbness. No SOB, nausea, diaphoresis -Aggravating/alleviating factors: better when she slept, woke up feeling fine -additional factors: family stress, they are caregiving for her parents -Prior cardiac history: none -Prior workup: none -Prior treatment: none -Alcohol: 1-1.5 drinks at a time, not every night -Tobacco: never -Comorbidities: type I diabetes, on insulin , with neuropathy. Followed by Dr. Ned Balint. Has declined statins, manages with lifestyle -Exercise level: hikes, walks, stays active -Diet: whole foods, plant based for last 7 years -Family history:  mother may have had TIAs in her 70s. Grandmother died age 58 of a stroke.  BP usually 110/60.   ROS: Denies shortness of breath at rest or with normal exertion. No PND, orthopnea, LE edema or unexpected weight gain. No syncope or palpitations. ROS otherwise negative except as noted.   Studies Reviewed:  Aaron Aas    EKG:       Physical Exam:   VS:  BP 132/84   Pulse 60   Ht 5' 0.6" (1.539 m)   Wt 123 lb 6.4 oz (56 kg)   LMP 09/09/2007 (Approximate) Comment: this was with her endometrial ablation  SpO2 99%   BMI 23.63 kg/m    Wt Readings from Last 3 Encounters:  01/07/24 123 lb 6.4 oz (56 kg)  11/03/23 122 lb 12.8 oz (55.7 kg)  08/25/23 121 lb 3.2 oz (55 kg)    GEN: Well nourished, well developed in no acute distress HEENT: Normal, moist mucous membranes NECK: No JVD CARDIAC: regular rhythm, normal S1 and S2, no rubs or gallops. No murmur. VASCULAR: Radial and DP pulses 2+ bilaterally. No carotid bruits RESPIRATORY:  Clear to auscultation without rales, wheezing or rhonchi  ABDOMEN: Soft, non-tender, non-distended MUSCULOSKELETAL:  Ambulates independently SKIN: Warm and dry, no edema NEUROLOGIC:  Alert and oriented x 3. No focal neuro deficits noted. PSYCHIATRIC:  Normal affect    ASSESSMENT AND PLAN: .    Chest discomfort -ER evaluation reassuring -given her diabetes, discussed further evaluation with testing -after shared decision making, will proceed with CT coronary -baseline HR typically low, will hold on oral metoprolol, can use IV if needed -discussed need for SL NG, encouraged hydration before the test  Type I diabetes (late onset, about 2021) -follows with Dr. Ned Balint -excellent lifestyle, has declined statins before. We discussed data at length, what we see on CT, recommendations for statins if plaque seen. Discussed her questions at length.  CV risk counseling and prevention -recommend heart healthy/Mediterranean diet, with whole  grains, fruits, vegetable, fish, lean meats, nuts, and olive oil. Limit salt. -recommend moderate walking, 3-5 times/week for 30-50 minutes each session. Aim for at least 150 minutes.week. Goal should be pace of 3 miles/hours, or walking 1.5 miles in 30 minutes -recommend avoidance of tobacco products. Avoid excess alcohol. -ASCVD risk  score: The 10-year ASCVD risk score (Arnett DK, et al., 2019) is: 6.3%   Values used to calculate the score:     Age: 45 years     Sex: Female     Is Non-Hispanic African American: No     Diabetic: Yes     Tobacco smoker: No     Systolic Blood Pressure: 132 mmHg     Is BP treated: No     HDL Cholesterol: 88.4 mg/dL     Total Cholesterol: 163 mg/dL    Dispo: to be determined based on results of testing  Signed, Sheryle Donning, MD   Sheryle Donning, MD, PhD, [facility]  [facility]  [place]  [facility] at [facility] at [facility] [address] [place], [place] [address] [phone]

## 2024-01-07 NOTE — Patient Instructions (Signed)
 Medication Instructions:  Your physician recommends that you continue on your current medications as directed. Please refer to the Current Medication list given to you today.   *If you need a refill on your cardiac medications before your next appointment, please call your pharmacy*  Lab Work: Your physician recommends that you return for lab work: BMET If you have labs (blood work) drawn today and your tests are completely normal, you will receive your results only by: MyChart Message (if you have MyChart) OR A paper copy in the mail If you have any lab test that is abnormal or we need to change your treatment, we will call you to review the results.  Testing/Procedures:   Your cardiac CT will be scheduled at one of the below locations:   [facility] [address] [place], [place] [address] [phone]  OR  [facility] [address] [place], [place] [address] [phone]  OR   [facility] [address] [place], [place] [address] [phone]  OR   [facility] [address] [place], [place] [address] [phone]  OR   [facility] and Vascular Tower [address]  [place], [place] [address] Opening January 04, 2024  If scheduled at [facility], please arrive at the [facility] and Children's Entrance (Entrance C2) of [facility] 30 minutes prior to test start time. You can use the FREE valet parking offered at entrance C (encouraged to control the heart rate for the test)  Proceed to the [facility] Radiology Department (first floor) to check-in and test prep.   All radiology patients and guests should use entrance C2 at [facility], accessed from [facility], even though the hospital's physical address listed is [address]    If scheduled at the Heart and Vascular Tower at [address], please enter  the parking lot using the [address] entrance and use the FREE valet service at the patient drop-off area. Enter the buidling and check-in with registration on the main floor.  If scheduled at [facility] or [facility], please arrive 15 mins early for check-in and test prep.  There is spacious parking and easy access to the radiology department from the [facility] and Vascular entrance. Please enter here and check-in with the desk attendant.   If scheduled at [facility], please arrive 30 minutes early for check-in and test prep.  Please follow these instructions carefully (unless otherwise directed):  An IV will be required for this test and Nitroglycerin will be given.  Hold all erectile dysfunction medications at least 3 days (72 hrs) prior to test. (Ie viagra, cialis, sildenafil, tadalafil, etc)   On the Night Before the Test: Be sure to Drink plenty of water. Do not consume any caffeinated/decaffeinated beverages or chocolate 12 hours prior to your test. Do not take any antihistamines 12 hours prior to your test.  On the Day of the Test: Drink plenty of water until 1 hour prior to the test. Do not eat any food 1 hour prior to test. You may take your regular medications prior to the test.  Take metoprolol (Lopressor) two hours prior to test. If you take Furosemide/Hydrochlorothiazide/Spironolactone/Chlorthalidone, please HOLD on the morning of the test. Patients who wear a continuous glucose monitor MUST remove the device prior to scanning. FEMALES- please wear underwire-free bra if available, avoid dresses & tight clothing  After the Test: Drink plenty of water. After receiving IV contrast, you may experience a mild flushed feeling. This is normal. On occasion, you may experience a mild rash up to 24 hours after the test. This is not dangerous. If this occurs, you can take Benadryl 25 mg, Zyrtec, Claritin, or Allegra and  increase your fluid intake. (Patients taking Tikosyn should avoid Benadryl, and may take Zyrtec, Claritin, or Allegra) If you experience trouble breathing, this can be serious. If it is severe call 911 IMMEDIATELY. If it is mild, please call our office.  We will call to schedule your test 2-4 weeks out understanding that some insurance companies will need an authorization prior to the service being performed.   For more information and frequently asked questions, please visit our website : [email or web address]  For non-scheduling related questions, please contact the cardiac imaging nurse navigator should you have any questions/concerns: Cardiac Imaging Nurse Navigators Direct Office Dial : [phone]   For scheduling needs, including cancellations and rescheduling, please call [place], [phone].   Follow-Up: At [facility], you and your health needs are our priority.  As part of our continuing mission to provide you with exceptional heart care, our providers are all part of one team.  This team includes your primary Cardiologist (physician) and Advanced Practice Providers or APPs (Physician Assistants and Nurse Practitioners) who all work together to provide you with the care you need, when you need it.  Please follow up TBD after testing with Dr. Veryl Gottron, Slater Duncan, NP or Neomi Banks, NP   We recommend signing up for the patient portal called "MyChart".  Sign up information is provided on this After Visit Summary.  MyChart is used to connect with patients for Virtual Visits (Telemedicine).  Patients are able to view lab/test results, encounter notes, upcoming appointments, etc.  Non-urgent messages can be sent to your provider as well.   To learn more about what you can do with MyChart, go to [email or web address].

## 2024-01-11 ENCOUNTER — Telehealth (HOSPITAL_COMMUNITY): Payer: Self-pay | Admitting: Emergency Medicine

## 2024-01-13 ENCOUNTER — Telehealth: Payer: Self-pay | Admitting: Cardiology

## 2024-01-13 NOTE — Telephone Encounter (Signed)
 Patient calling to see if the insurance has approve her CT for 5/19. Please advise

## 2024-01-20 LAB — BASIC METABOLIC PANEL WITH GFR
BUN/Creatinine Ratio: 16 (ref 12–28)
BUN: 9 mg/dL (ref 8–27)
CO2: 22 mmol/L (ref 20–29)
Calcium: 9.2 mg/dL (ref 8.7–10.3)
Chloride: 92 mmol/L — ABNORMAL LOW (ref 96–106)
Creatinine, Ser: 0.57 mg/dL (ref 0.57–1.00)
Glucose: 102 mg/dL — ABNORMAL HIGH (ref 70–99)
Potassium: 4.5 mmol/L (ref 3.5–5.2)
Sodium: 132 mmol/L — ABNORMAL LOW (ref 134–144)
eGFR: 102 mL/min/{1.73_m2} (ref >59)

## 2024-01-21 ENCOUNTER — Encounter (HOSPITAL_COMMUNITY): Payer: Self-pay

## 2024-01-25 ENCOUNTER — Ambulatory Visit (HOSPITAL_COMMUNITY)
Admission: RE | Admit: 2024-01-25 | Discharge: 2024-01-25 | Disposition: A | Discharge status: Home / Self Care | Source: Ambulatory Visit | Attending: Cardiology | Admitting: Cardiology

## 2024-01-25 DIAGNOSIS — R072 Precordial pain: Secondary | ICD-10-CM | POA: Insufficient documentation

## 2024-01-25 MED ORDER — IOHEXOL 350 MG/ML SOLN
100.0000 mL | Freq: Once | INTRAVENOUS | Status: AC | PRN
  Administered 2024-01-25: 100 mL via INTRAVENOUS

## 2024-01-25 MED ORDER — NITROGLYCERIN 0.4 MG SL SUBL
SUBLINGUAL_TABLET | SUBLINGUAL | Status: AC
  Filled 2024-01-25: qty 2

## 2024-01-25 MED ORDER — NITROGLYCERIN 0.4 MG SL SUBL
0.8000 mg | SUBLINGUAL_TABLET | Freq: Once | SUBLINGUAL | Status: AC
  Administered 2024-01-25: 0.8 mg via SUBLINGUAL

## 2024-01-28 ENCOUNTER — Ambulatory Visit (HOSPITAL_BASED_OUTPATIENT_CLINIC_OR_DEPARTMENT_OTHER): Payer: Self-pay | Admitting: Cardiology

## 2024-02-09 ENCOUNTER — Encounter: Payer: Self-pay | Admitting: Obstetrics and Gynecology

## 2024-02-09 NOTE — Progress Notes (Unsigned)
 63 y.o. G66P0 Married Caucasian female here for annual exam.    Removes pessary nightly usually.  Good bladder control.   Healthy diet.   Caregiving for elderly parents.  Her mother has dementia. Husband is retired.   PCP: Almira Jaeger, MD   Patient's last menstrual period was 09/09/2007 (approximate).           Sexually active: Yes.    The current method of family planning is postmenopausal status.    Menopausal hormone therapy:  n/a Exercising: Yes.    Walking/hiking, weight lifing Smoker:  no  OB History  Gravida Para Term Preterm AB Living  2     2  SAB IAB Ectopic Multiple Live Births          # Outcome Date GA Lbr Len/2nd Weight Sex Type Anes PTL Lv  2 Gravida      Vag-Spont     1 Gravida      Vag-Spont        HEALTH MAINTENANCE: Last 2 paps:  12/10/22 neg HR HPV neg, 12/09/21 atrophic pattern with epithelial atypia, HR HPV neg History of abnormal Pap or positive HPV:  yes.  Colposcopy LGSIL.  Mammogram:   08/27/23 Breast Density Cat B, BIRADS Cat 1 neg Colonoscopy:  10/29/21  Bone Density:  n/a  Result  n/a   Immunization History  Administered Date(s) Administered   Influenza,inj,Quad PF,6+ Mos 07/31/2015, 08/04/2016, 08/05/2017, 08/11/2018, 06/20/2019, 08/21/2021   PFIZER Comirnaty(Gray Top)Covid-19 Tri-Sucrose Vaccine 12/24/2020   PFIZER(Purple Top)SARS-COV-2 Vaccination 11/18/2019, 12/09/2019, 06/07/2020   Pneumococcal Polysaccharide-23 03/09/2018   Tdap 07/31/2015   Zoster Recombinant(Shingrix ) 03/09/2018, 08/11/2018      reports that she has never smoked. She has never used smokeless tobacco. She reports current alcohol use. She reports that she does not use drugs.  Past Medical History:  Diagnosis Date   Anemia    Chicken pox    COVID    may 2022 cold s/s cough . blood sugars out of control.   COVID 10/2022   Depression    Resolved. in 55s- meds and counseling.   Diabetes mellitus (HCC)    Family history of hemochromatosis    father-  screen iron   Gestational diabetes 09/08/1985   a1c q3 years   Hammer toe    Lichen planus    Sees [facility] dermatology - Dr. Izetta Marshall   Morton's neuroma    per records,pt.denies 10/15/21   Wears glasses     Past Surgical History:  Procedure Laterality Date   DILATATION & CURETTAGE/HYSTEROSCOPY WITH MYOSURE N/A 03/03/2022   Procedure: hysterscopic myomectomy dilation and curretage;  Surgeon: Greta Leatherwood, MD;  Location: [facility];  Service: Gynecology;  Laterality: N/A;  hysterscopic myomectomy dilation and curretage   ENDOMETRIAL ABLATION  2010   heavy menstrual bleeding   TONSILLECTOMY AND ADENOIDECTOMY  1965    Current Outpatient Medications  Medication Sig Dispense Refill   Accu-Chek FastClix Lancets MISC Use as instructed 2x a day 204 each 3   ACCU-CHEK GUIDE test strip USE AS INSTRUCTED TO CHECK BLOOD SUGAR 4 TIMES A DAY - ACCUCHEK GUIDE 100 strip 3   acyclovir  ointment (ZOVIRAX ) 5 % Apply 1 application topically every 3 (three) hours. 30 g 1   BAQSIMI  TWO PACK 3 MG/DOSE POWD Use as needed for hypoglycemia 1 each PRN   BD PEN NEEDLE NANO 2ND GEN 32G X 4 MM MISC USE 6-8 TIMES A DAY FOR INSULIN  500 each 3  Blood Glucose Calibration (ACCU-CHEK GUIDE CONTROL) LIQD To calibrate Accu-Chek Guide meter 1 each 11   Cholecalciferol 50 MCG (2000 UT) CAPS Take 1 capsule by mouth daily.     Continuous Glucose Sensor (DEXCOM G7 SENSOR) MISC 3 each by Does not apply route every 30 (thirty) days. Apply 1 sensor every 10 days 9 each 4   ferrous sulfate (FEROSUL) 325 (65 FE) MG tablet      Insulin  Aspart, w/Niacinamide, (FIASP  PENFILL) 100 UNIT/ML SOCT USE UP TO 20 UNITS A DAY AS ADVISED 30 mL 0   insulin  glargine (LANTUS SOLOSTAR) 100 UNIT/ML Solostar Pen Check on the skin up to 10 units daily, as advised.  To replace Basaglar . 30 mL 3   magnesium 30 MG tablet Activated magnesium     NOVOPEN ECHO DEVI Use as advised 1 each 3   triamcinolone cream (KENALOG) 0.1 % For  autoimmune rash, not taking right now     vitamin B-12 (CYANOCOBALAMIN ) 250 MCG tablet Take 250 mcg by mouth daily. Takes it once a week     No current facility-administered medications for this visit.    ALLERGIES: Penicillins and Sulfa antibiotics  Family History  Problem Relation Age of Onset   Osteoporosis Mother    Depression Mother        dealing with some memory issues   Glaucoma Mother    Dementia Mother    Hemochromatosis Father    Alcoholism Father    Hearing loss Brother        otosclerosis   Hypertension Brother    Hyperlipidemia Brother    Diabetes Brother        ? PE as well- she will verify   Hyperlipidemia Brother    Stroke Other        grandparent   Hypertension Other        grandparent   Colon cancer Neg Hx    Colon polyps Neg Hx    Esophageal cancer Neg Hx    Rectal cancer Neg Hx    Stomach cancer Neg Hx     Review of Systems  All other systems reviewed and are negative.   PHYSICAL EXAM:  BP 114/72 (BP Location: Left Arm, Patient Position: Sitting)   Pulse 65   Ht 5' 0.75" (1.543 m)   Wt 122 lb (55.3 kg)   LMP 09/09/2007 (Approximate) Comment: this was with her endometrial ablation  SpO2 99%   BMI 23.24 kg/m     General appearance: alert, cooperative and appears stated age Head: normocephalic, without obvious abnormality, atraumatic Neck: no adenopathy, supple, symmetrical, trachea midline and thyroid  normal to inspection and palpation Lungs: clear to auscultation bilaterally Breasts: normal appearance, no masses or tenderness, No nipple retraction or dimpling, No nipple discharge or bleeding, No axillary adenopathy Heart: regular rate and rhythm Abdomen: soft, non-tender; no masses, no organomegaly Extremities: extremities normal, atraumatic, no cyanosis or edema Skin: skin color, texture, turgor normal. No rashes or lesions Lymph nodes: cervical, supraclavicular, and axillary nodes normal. Neurologic: grossly normal  Pelvic: External  genitalia:  no lesions              No abnormal inguinal nodes palpated.              Urethra:  normal appearing urethra with no masses, tenderness or lesions              Bartholins and Skenes: normal  Vagina: normal appearing vagina with normal color and discharge, no lesions              Cervix: no lesions.  First degree uterine prolapse.               Pap taken: yes Bimanual Exam:  Uterus:  normal size, contour, position, consistency, mobility, non-tender              Adnexa: no mass, fullness, tenderness              Rectal exam: yes.  Confirms.              Anus:  normal sphincter tone, no lesions  Chaperone was present for exam:  Jonetta Nest, CMA  ASSESSMENT: Well woman visit with gynecologic exam. LGSIL of cervix 2023. Status post hysteroscopy with Myosure resection of fibroid. Incomplete uterovaginal prolapse.  Using a pessary ring with support. Hx endometrial ablation.  DM.  On insulin .  PHQ-9: 0  PLAN: Mammogram screening discussed. Self breast awareness reviewed. Pap and HRV collected:  yes Guidelines for Calcium, Vitamin D, regular exercise program including cardiovascular and weight bearing exercise. Medication refills:  NA Follow up:  yearly and prn.

## 2024-02-10 ENCOUNTER — Ambulatory Visit (INDEPENDENT_AMBULATORY_CARE_PROVIDER_SITE_OTHER): Payer: 59 | Admitting: Obstetrics and Gynecology

## 2024-02-10 ENCOUNTER — Encounter: Payer: Self-pay | Admitting: Obstetrics and Gynecology

## 2024-02-10 ENCOUNTER — Other Ambulatory Visit (HOSPITAL_COMMUNITY)
Admission: RE | Admit: 2024-02-10 | Discharge: 2024-02-10 | Disposition: A | Discharge status: Home / Self Care | Source: Ambulatory Visit | Attending: Obstetrics and Gynecology | Admitting: Obstetrics and Gynecology

## 2024-02-10 VITALS — BP 114/72 | HR 65 | Ht 60.75 in | Wt 122.0 lb

## 2024-02-10 DIAGNOSIS — Z01419 Encounter for gynecological examination (general) (routine) without abnormal findings: Secondary | ICD-10-CM

## 2024-02-10 DIAGNOSIS — Z1331 Encounter for screening for depression: Secondary | ICD-10-CM

## 2024-02-10 DIAGNOSIS — Z124 Encounter for screening for malignant neoplasm of cervix: Secondary | ICD-10-CM

## 2024-02-10 DIAGNOSIS — Z87410 Personal history of cervical dysplasia: Secondary | ICD-10-CM

## 2024-02-10 NOTE — Patient Instructions (Signed)

## 2024-02-11 ENCOUNTER — Other Ambulatory Visit: Payer: Self-pay | Admitting: Internal Medicine

## 2024-02-11 DIAGNOSIS — E139 Other specified diabetes mellitus without complications: Secondary | ICD-10-CM

## 2024-02-16 ENCOUNTER — Ambulatory Visit: Payer: Self-pay | Admitting: Obstetrics and Gynecology

## 2024-02-16 LAB — CYTOLOGY - PAP
Comment: NEGATIVE
Diagnosis: NEGATIVE
High risk HPV: NEGATIVE

## 2024-02-26 ENCOUNTER — Ambulatory Visit (INDEPENDENT_AMBULATORY_CARE_PROVIDER_SITE_OTHER): Payer: BC Managed Care – PPO | Admitting: Family Medicine

## 2024-02-26 ENCOUNTER — Ambulatory Visit: Payer: Self-pay | Admitting: Family Medicine

## 2024-02-26 ENCOUNTER — Encounter: Payer: Self-pay | Admitting: Family Medicine

## 2024-02-26 VITALS — BP 90/66 | HR 65 | Temp 98.6°F | Ht 60.0 in | Wt 124.0 lb

## 2024-02-26 DIAGNOSIS — E785 Hyperlipidemia, unspecified: Secondary | ICD-10-CM

## 2024-02-26 DIAGNOSIS — Z0000 Encounter for general adult medical examination without abnormal findings: Secondary | ICD-10-CM

## 2024-02-26 DIAGNOSIS — G2581 Restless legs syndrome: Secondary | ICD-10-CM | POA: Diagnosis not present

## 2024-02-26 DIAGNOSIS — R6889 Other general symptoms and signs: Secondary | ICD-10-CM

## 2024-02-26 DIAGNOSIS — E871 Hypo-osmolality and hyponatremia: Secondary | ICD-10-CM

## 2024-02-26 DIAGNOSIS — E139 Other specified diabetes mellitus without complications: Secondary | ICD-10-CM | POA: Diagnosis not present

## 2024-02-26 DIAGNOSIS — Z789 Other specified health status: Secondary | ICD-10-CM | POA: Diagnosis not present

## 2024-02-26 LAB — COMPREHENSIVE METABOLIC PANEL WITH GFR
ALT: 11 U/L (ref 0–35)
AST: 20 U/L (ref 0–37)
Albumin: 4.2 g/dL (ref 3.5–5.2)
Alkaline Phosphatase: 53 U/L (ref 39–117)
BUN: 7 mg/dL (ref 6–23)
CO2: 29 meq/L (ref 19–32)
Calcium: 8.9 mg/dL (ref 8.4–10.5)
Chloride: 96 meq/L (ref 96–112)
Creatinine, Ser: 0.51 mg/dL (ref 0.40–1.20)
GFR: 99.06 mL/min (ref >60.00)
Glucose, Bld: 72 mg/dL (ref 70–99)
Potassium: 4.4 meq/L (ref 3.5–5.1)
Sodium: 130 meq/L — ABNORMAL LOW (ref 135–145)
Total Bilirubin: 0.5 mg/dL (ref 0.2–1.2)
Total Protein: 6.5 g/dL (ref 6.0–8.3)

## 2024-02-26 LAB — CBC WITH DIFFERENTIAL/PLATELET
Basophils Absolute: 0 10*3/uL (ref 0.0–0.1)
Basophils Relative: 0.7 % (ref 0.0–3.0)
Eosinophils Absolute: 0 10*3/uL (ref 0.0–0.7)
Eosinophils Relative: 0.9 % (ref 0.0–5.0)
HCT: 39.3 % (ref 36.0–46.0)
Hemoglobin: 13.3 g/dL (ref 12.0–15.0)
Lymphocytes Relative: 20.3 % (ref 12.0–46.0)
Lymphs Abs: 0.8 10*3/uL (ref 0.7–4.0)
MCHC: 33.9 g/dL (ref 30.0–36.0)
MCV: 90.4 fl (ref 78.0–100.0)
Monocytes Absolute: 0.4 10*3/uL (ref 0.1–1.0)
Monocytes Relative: 10.1 % (ref 3.0–12.0)
Neutro Abs: 2.6 10*3/uL (ref 1.4–7.7)
Neutrophils Relative %: 68 % (ref 43.0–77.0)
Platelets: 160 10*3/uL (ref 150.0–400.0)
RBC: 4.35 Mil/uL (ref 3.87–5.11)
RDW: 12.9 % (ref 11.5–15.5)
WBC: 3.8 10*3/uL — ABNORMAL LOW (ref 4.0–10.5)

## 2024-02-26 LAB — IBC + FERRITIN
Ferritin: 51.4 ng/mL (ref 10.0–291.0)
Iron: 84 ug/dL (ref 42–145)
Saturation Ratios: 28.4 % (ref 20.0–50.0)
TIBC: 295.4 ug/dL (ref 250.0–450.0)
Transferrin: 211 mg/dL — ABNORMAL LOW (ref 212.0–360.0)

## 2024-02-26 LAB — TSH: TSH: 1.04 u[IU]/mL (ref 0.35–5.50)

## 2024-02-26 LAB — MICROALBUMIN / CREATININE URINE RATIO
Creatinine,U: 23.5 mg/dL
Microalb Creat Ratio: UNDETERMINED mg/g (ref 0.0–30.0)
Microalb, Ur: <0.7 mg/dL

## 2024-02-26 LAB — HEMOGLOBIN A1C: Hgb A1c MFr Bld: 5.8 % (ref 4.6–6.5)

## 2024-02-26 NOTE — Progress Notes (Signed)
 Phone [phone]   Subjective:  Patient presents today for their annual physical. Chief complaint-noted.   See problem oriented charting- ROS- full  review of systems was completed and negative except for: cold intolerance  The following were reviewed and entered/updated in epic: Past Medical History:  Diagnosis Date   Anemia    Anxiety 2023   Chicken pox    COVID    may 2022 cold s/s cough . blood sugars out of control.   COVID 10/2022   Depression    Resolved. in 67s- meds and counseling.   Diabetes mellitus (HCC)    Family history of hemochromatosis    father- screen iron   Gestational diabetes 09/08/1985   a1c q3 years   Hammer toe    Lichen planus    Sees [facility] - Dr. 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) 11/23/2018    Priority: High   Hyperlipidemia 08/04/2016    Priority: Medium    Fever blister 08/04/2016    Priority: Low   Depression     Priority: Low   Family history of hemochromatosis     Priority: Low   Type 1 diabetes mellitus with hyperglycemia (HCC) 11/03/2023   COVID-19 virus infection 01/25/2021   Hyponatremia 10/16/2020   Otalgia, right 06/27/2019   Rectocele 08/19/2018   Past Surgical History:  Procedure Laterality Date   DILATATION & CURETTAGE/HYSTEROSCOPY WITH MYOSURE N/A 03/03/2022   Procedure: hysterscopic myomectomy dilation and curretage;  Surgeon: Brittany Leatherwood, MD;  Location: [facility];  Service: Gynecology;  Laterality: N/A;  hysterscopic myomectomy dilation and curretage   ENDOMETRIAL ABLATION  2010   heavy menstrual bleeding   TONSILLECTOMY AND ADENOIDECTOMY  1965    Family History  Problem Relation Age of Onset   Osteoporosis Mother    Depression Mother        dealing with some memory issues   Glaucoma Mother    Dementia Mother     Hyperlipidemia Mother    Hemochromatosis Father    Alcoholism Father    Alcohol abuse Father    Hyperlipidemia Father    Hypertension Father    Hearing loss Brother        otosclerosis   Hypertension Brother    Hyperlipidemia Brother    Diabetes Brother        ? PE as well- she will verify   Hyperlipidemia Brother    Stroke Other        grandparent   Hypertension Other        grandparent   Diabetes Maternal Grandmother    Stroke Maternal Grandmother    Stroke Paternal Grandfather    Hypertension Paternal Grandmother    Hypertension Brother    Hyperlipidemia Brother    Hypertension Brother    Diabetes Maternal Uncle    Obesity Maternal Uncle    Colon cancer Neg Hx    Colon polyps Neg Hx    Esophageal cancer Neg Hx    Rectal cancer Neg Hx    Stomach cancer Neg Hx     Medications- reviewed and updated Current Outpatient Medications  Medication Sig Dispense Refill   Accu-Chek FastClix Lancets MISC Use as instructed 2x a day 204 each 3   ACCU-CHEK GUIDE test strip USE AS INSTRUCTED TO CHECK BLOOD SUGAR 4 TIMES A DAY -  ACCUCHEK GUIDE 100 strip 3   acyclovir  ointment (ZOVIRAX ) 5 % Apply 1 application topically every 3 (three) hours. 30 g 1   BAQSIMI  TWO PACK 3 MG/DOSE POWD Use as needed for hypoglycemia 1 each PRN   BD PEN NEEDLE NANO 2ND GEN 32G X 4 MM MISC USE 6-8 TIMES A DAY FOR INSULIN  500 each 3   Blood Glucose Calibration (ACCU-CHEK GUIDE CONTROL) LIQD To calibrate Accu-Chek Guide meter 1 each 11   Cholecalciferol 50 MCG (2000 UT) CAPS Take 1 capsule by mouth once a week.     Continuous Glucose Sensor (DEXCOM G7 SENSOR) MISC 3 each by Does not apply route every 30 (thirty) days. Apply 1 sensor every 10 days 9 each 4   ferrous sulfate (FEROSUL) 325 (65 FE) MG tablet      Insulin  Aspart, w/Niacinamide, (FIASP  PENFILL) 100 UNIT/ML SOCT USE UP TO 20 UNITS A DAY AS ADVISED 15 mL 1   insulin  glargine (LANTUS SOLOSTAR) 100 UNIT/ML Solostar Pen Check on the skin up to 10 units  daily, as advised.  To replace Basaglar . 30 mL 3   magnesium 30 MG tablet Activated magnesium     NOVOPEN ECHO DEVI Use as advised 1 each 3   triamcinolone cream (KENALOG) 0.1 % For autoimmune rash, not taking right now     vitamin B-12 (CYANOCOBALAMIN ) 250 MCG tablet Take 250 mcg by mouth daily. Takes it once a week     No current facility-administered medications for this visit.    Allergies-reviewed and updated Allergies  Allergen Reactions   Penicillins Rash   Sulfa Antibiotics Rash    Social History   Social History Narrative   Married. (husband [name] of Dr. [facility]). 2 children grown and married.   Son - 2 kids including oldest [facility] [id number], son is 45 years old   Daughter- 97 year old granddaughter  youngest 2 in 2020 premature 2 months early (59 months old identical twin girls) 08/15/2019         Has had 12 moves in 32 years, most recently [place]  for 1.5 years, [place] 5 years prior, [place] prior      B.A. In nutrition. Masters in [employer].  Has worked as a Comptroller.      Objective  Objective:  BP 90/66   Pulse 65   Temp 98.6 F (37 C) (Temporal)   Ht 5' (1.524 m)   Wt 124 lb (56.2 kg)   LMP 09/09/2007 (Approximate) Comment: this was with her endometrial ablation  SpO2 99%   BMI 24.22 kg/m  Gen: NAD, resting comfortably HEENT: Mucous membranes are moist. Oropharynx normal Neck: no thyromegaly CV: RRR no murmurs rubs or gallops Lungs: CTAB no crackles, wheeze, rhonchi Abdomen: soft/nontender/nondistended/normal bowel sounds. No rebound or guarding.  Ext: no edema Skin: warm, dry Neuro: grossly normal, moves all extremities, PERRLA   Assessment and Plan   63 y.o. female presenting for annual physical.  Health Maintenance counseling: 1. Anticipatory guidance: Patient counseled regarding regular dental exams -q6 months, eye exams - yearly,  avoiding smoking and second hand smoke , limiting alcohol to 1 beverage per day- very rare social  , no illicit drugs .   2. Risk factor reduction:  Advised patient of need for regular exercise and diet rich and fruits and vegetables to reduce risk of heart attack and stroke.  Exercise- goal daily .  Diet/weight management-within 3 lbs last year- healthy weight and very clean diet.  Wt Readings from Last 3 Encounters:  02/26/24 124 lb (56.2 kg)  02/10/24 122 lb (55.3 kg)  01/07/24 123 lb 6.4 oz (56 kg)  3. Immunizations/screenings/ancillary studies- wants to check MMR status- very reasobale to evaluate. Diabetes eye exam plans for next month. Holding off on COVID. Wants to hold off on Prevnar 20.  Immunization History  Administered Date(s) Administered   Influenza,inj,Quad PF,6+ Mos 07/31/2015, 08/04/2016, 08/05/2017, 08/11/2018, 06/20/2019, 08/21/2021   PFIZER Comirnaty(Gray Top)Covid-19 Tri-Sucrose Vaccine 12/24/2020   PFIZER(Purple Top)SARS-COV-2 Vaccination 11/18/2019, 12/09/2019, 06/07/2020   Pneumococcal Polysaccharide-23 03/09/2018   Tdap 07/31/2015   Zoster Recombinant(Shingrix ) 03/09/2018, 08/11/2018  4. Cervical cancer screening- 02/10/24 and reassuring- considering q2 year pelvic exams stil 5. Breast cancer screening-  breast exam with gyn and mammogram 08/27/23 6. Colon cancer screening - 10/29/21 colonoscopy with 5 year repeat 7. Skin cancer screening- dermatology said as needed only. advised regular sunscreen use. Denies worrisome, changing, or new skin lesions.  -history lichenplanus 8. Birth control/STD check- postmenopausal and monogamous  9. Osteoporosis screening at 71- prefers to wait until 64 10. Smoking associated screening - never smoker  Status of chronic or acute concerns   # latent autoimmune diabetes of adulthood (type 1 diabetes)-continues close follow-up with Dr. Aldona Amel and has been well-controlled with insulin -also uses Dexcom G7 . Lantus twice a day and fiasp  multiple shots in day Lab Results  Component Value Date   HGBA1C 6.1 (A) 11/03/2023   HGBA1C  6.2 (A) 07/07/2023   HGBA1C 6.0 (A) 03/04/2023   # Microalbuminuria-we discussed very mild microalbuminuria when adjusting for our lab error-blood pressure too low for ACE inhibitor or ARB.  Has actually improved in the last 4 years from 50 down to 33-we opted to recheck today. With latent autoimmune diabetes of adulthood (type 1 diabetes) would also defer medication like Jardiance likely  - I think the reporting may have been error with the low microalbumin   #reports cold intolerance- even sometimes foot warmers in summer- we will check her TSh  #hyperlipidemia-treated with whole food plant based vegan diet S: Medication: None -Testing with Dr. Veryl Gottron very reassuring with coronary artery calcium score of 0 and total plaque volume of 0 and normal coronary arteries on CT coronary morphology.  Initially presented to emergency department 11/18/2023 with chest discomfort and left hand numbness Lab Results  Component Value Date   CHOL 163 08/25/2023   HDL 88.40 08/25/2023   LDLCALC 65 08/25/2023   TRIG 47.0 08/25/2023   CHOLHDL 2 08/25/2023   A/P: lipids well controlled in December- hold off on repeat until yearly. No coronary artery disease thankfully   # B12-remains on B12 supplement- 2000 twice a week last B12 levels look excellent Lab Results  Component Value Date   VITAMINB12 1,380 (H) 08/25/2023   # Mild hyponatremia-likely due to low solute diet  #restless legs and cramps better on ferrous sulfate   Recommended follow up: Return in about 1 year (around 02/25/2025) for physical or sooner if needed.Schedule b4 you leave.  Future Appointments  Date Time Provider Department [facility]  02/29/2024 10:20 AM Emilie Harden, MD [facility] None  02/14/2025  9:30 AM Brittany Leatherwood, MD [facility] None   Lab/Order associations:NOT fasting   ICD-10-CM   1. Preventative health care  Z00.00     2. Cold intolerance  R68.89 TSH    3. Measles, mumps, rubella (MMR) vaccination status  unknown  Z78.9 Measles/Mumps/Rubella Immunity    4. 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 Hyperlipidemia, unspecified hyperlipidemia type  E78.5 Comprehensive metabolic panel with GFR    CBC with Differential/Platelet    6. Hyponatremia  E87.1     7. Restless legs  G25.81 IBC + Ferritin      No orders of the defined types were placed in this encounter.   Return precautions advised.  Brittany Crooked, MD

## 2024-02-26 NOTE — Patient Instructions (Addendum)
 Please stop by lab before you go If you have mychart- we will send your results within 3 business days of us  receiving them.  If you do not have mychart- we will call you about results within 5 business days of us  receiving them.  *please also note that you will see labs on mychart as soon as they post. I will later go in and write notes on them- will say notes from Dr. Arlene Ben   Recommended follow up: Return in about 1 year (around 02/25/2025) for physical or sooner if needed.Schedule b4 you leave.

## 2024-02-28 LAB — MEASLES/MUMPS/RUBELLA IMMUNITY
Mumps IgG: >300 [AU]/ml
Rubella: 2.5 {index}
Rubeola IgG: 218 [AU]/ml

## 2024-02-29 ENCOUNTER — Ambulatory Visit: Payer: 59 | Admitting: Internal Medicine

## 2024-02-29 ENCOUNTER — Encounter: Payer: Self-pay | Admitting: Internal Medicine

## 2024-02-29 VITALS — BP 120/70 | HR 64 | Ht 60.0 in | Wt 126.0 lb

## 2024-02-29 DIAGNOSIS — E871 Hypo-osmolality and hyponatremia: Secondary | ICD-10-CM | POA: Diagnosis not present

## 2024-02-29 DIAGNOSIS — E1065 Type 1 diabetes mellitus with hyperglycemia: Secondary | ICD-10-CM | POA: Diagnosis not present

## 2024-02-29 DIAGNOSIS — E785 Hyperlipidemia, unspecified: Secondary | ICD-10-CM | POA: Diagnosis not present

## 2024-02-29 DIAGNOSIS — E139 Other specified diabetes mellitus without complications: Secondary | ICD-10-CM

## 2024-02-29 MED ORDER — ACCU-CHEK GUIDE W/DEVICE KIT: PACK | 0 refills | Status: AC

## 2024-02-29 MED ORDER — LANTUS SOLOSTAR 100 UNIT/ML ~~LOC~~ SOPN: PEN_INJECTOR | SUBCUTANEOUS | 3 refills | Status: AC

## 2024-02-29 MED ORDER — GLUCOSE BLOOD VI STRP: ORAL_STRIP | 3 refills | Status: AC

## 2024-02-29 MED ORDER — FIASP PENFILL 100 UNIT/ML ~~LOC~~ SOCT: SUBCUTANEOUS | 3 refills | Status: AC

## 2024-02-29 MED ORDER — BAQSIMI TWO PACK 3 MG/DOSE NA POWD: NASAL | 99 refills | Status: AC

## 2024-02-29 NOTE — Patient Instructions (Addendum)
 Please use: - Lantus 3 (when more active)-4 units in am and 3 (when more active)-4 units at bedtime - FiAsp : B'fast: 1:10  Lunch: 1:15 (-20) Dinner: 1:15 (-20) - FiAsp  sliding scale:  Target: 150 Correction factor: 50 (at night, try to use only 0.5-1 unit for correction)  You can split the bolus before a high fat meal: starting with 70%-30% over ~2-4 hours.  Please return in 4-6 months.

## 2024-02-29 NOTE — Progress Notes (Signed)
 Patient ID: Brittany Barton, female   DOB: Aug 02, 1961, 63 y.o.   MRN: [id number]   HPI: Brittany Barton is a 63 y.o.-year-old female, initially referred by her PCP, Dr. Katrinka, returning for follow-up for DM1, GDM in 1987, dx in 07/2017 as DM2 and in 11/2018 as DM1, insulin -dependent, well controlled, without long-term complications.  Last visit 4 months ago.  Interim history: No increased urination, blurry vision, chest pain. She mentions weight gain.  Reviewed HbA1c levels: Lab Results  Component Value Date   HGBA1C 5.8 02/26/2024   HGBA1C 6.1 (A) 11/03/2023   HGBA1C 6.2 (A) 07/07/2023   HGBA1C 6.0 (A) 03/04/2023   HGBA1C 6.0 (A) 10/15/2022   HGBA1C 5.9 (A) 06/13/2022   HGBA1C 5.6 02/10/2022   HGBA1C 6.1 (A) 10/21/2021   HGBA1C 5.7 (A) 06/18/2021   HGBA1C 5.8 (A) 02/19/2021   She is on: - Lantus 3 (when more active)-4 units in am and 3 (when more active)-4 units at bedtime - FiAsp : B'fast: 1:10  Lunch: 1:15 (-20) Dinner: 1:15 (-20) - FiAsp  sliding scale:  Target: 150 Correction factor: 50 (at night, try to use only 0.5-1 unit for correction) You may need 1-2 units of insulin  when you wake up. You may need 1-2 units of insulin  when you wake up. Inject FiAsp  10-15 min before coffee. Previously on Lantus, Humalog , and Lyumjev .  She checks her sugars more than 4 times a day with her Dexcom CGM:  Previously:  Previously:    Lowest sugar was 40 >> ...   52 >> 50s; she has hypoglycemia awareness at 70.  She keeps glucose tablets on the nightstand and juice in the fridge. Highest sugar was 330 >> ... 200s.  Glucometer: Accuchek   She continues on a plant-based diet without high glycemic index foods. In 02/2015 she started a plant-based diet and lost more than 80 pounds.  Pt's meals are: - Breakfast: Raw/soaked oatmeal, blueberries or other fruit, ground flaxseed, walnuts (2 tablespoons), soy milk - Lunch: Beans, whole grains, leafy greens, other vegetables and  fruit - Dinner: Salad with beans/peas or vegetable-based soup. - Snacks: 0-2-fruit, veggies, homemade bean dip, PB She uses Chronometer app for carb counting.  -No CKD, last BUN/creatinine:  Lab Results  Component Value Date   BUN 7 02/26/2024   BUN 9 01/20/2024   CREATININE 0.51 02/26/2024   CREATININE 0.57 01/20/2024   Lab Results  Component Value Date   MICRALBCREAT Unable to calculate 02/26/2024  Not on ACE inhibitor/ARB.  -+ History of HL, diet controlled; last set of lipids: Lab Results  Component Value Date   CHOL 163 08/25/2023   HDL 88.40 08/25/2023   LDLCALC 65 08/25/2023   TRIG 47.0 08/25/2023   CHOLHDL 2 08/25/2023  She declined statins.  - last eye exam was 02/13/2023: No DR  - NO numbness and tingling in her feet. Last foot exam: 06/2023.  Pt has FH of DM in MGM.  Hyponatremia: She has a history of low sodium: Lab Results  Component Value Date   NA 130 (L) 02/26/2024   NA 132 (L) 01/20/2024   NA 133 (L) 11/18/2023   NA 129 (L) 08/25/2023   NA 131 (L) 08/22/2022   NA 131 (L) 03/03/2022   NA 129 (L) 08/21/2021   NA 133 (L) 02/19/2021   NA 130 (L) 08/16/2020   NA 126 (L) 08/29/2019   NA 126 (L) 08/15/2019   NA 130 (L) 03/17/2019   NA 133 (L) 11/08/2018   NA 129 (  L) 08/04/2018   NA 134 (L) 08/06/2017   NA 134 (L) 07/29/2016   NA 136 07/31/2015   NA 139 05/24/2013   Investigation for hyponatremia reviewed: Component     Latest Ref Rng & Units 08/16/2020  Glucose     65 - 99 mg/dL 60 (L)  BUN     7 - 25 mg/dL 8  Creatinine     9.49 - 0.99 mg/dL 9.49  GFR, Est Non African American     > OR = 60 mL/min/1.65m2 105  Sodium     135 - 146 mmol/L 130 (L)  Potassium     3.5 - 5.3 mmol/L 4.6  Chloride     98 - 110 mmol/L 94 (L)  Osmolality, Urine     50 - 1,200 mOsm/kg 142  Osmolality     278 - 305 mOsm/kg 268 (L)  Sodium, Urine     28 - 272 mmol/L 33   Plasma sodium was 130, slightly low >> hyponatremia Plasma osmolality was 268, lower  than 275-280 >> hypoosmolar hyponatremia Urine sodium was 33, not very low >> ruling out excessive fluid losses (chronic diuretics, GI losses, excessive perspiration) or water overload Urine osmolality was 142, higher than 100 >> inappropriately high for hyponatremia  We ruled out adrenal insufficiency by a normal cosyntropin  stimulation test: Component     Latest Ref Rng & Units 11/30/2018 11/30/2018 11/30/2018         8:15 AM  8:59 AM  9:26 AM  Cortisol, Plasma     ug/dL 83.1 75.7 71.9  R793 ACTH      6 - 50 pg/mL 15     She does not have hypothyroidism: Lab Results  Component Value Date   TSH 1.04 02/26/2024   TSH 1.07 11/08/2018   TSH 1.36 07/29/2016   TSH 2.41 07/31/2015   She is not on HCTZ, carbamazepine, amitriptyline, SSRIs, opiates. No history of brain injury. No history of generalized infections. No history of malignancy. No recent brain or chest imaging. No chronic kidney disease.  At previous visits, I advised her to try to reduce her fluid intake to 1 L a day.  She was able to limit her intake to 1.5 L a day, but this was difficult for her to do. She had a lot of fluid retention during her COVID-19 episode from 01/2021 and after taking the antiviral medication she eliminated this (approximately 7 to 8 pounds).  Mother also has a history of hyponatremia. Brother with otosclerosis.  ROS: + See HPI  I reviewed pt's medications, allergies, PMH, social hx, family hx, and changes were documented in the history of present illness. Otherwise, unchanged from my initial visit note.  Past Medical History:  Diagnosis Date   Anemia    Anxiety 2023   Chicken pox    COVID    may 2022 cold s/s cough . blood sugars out of control.   COVID 10/2022   Depression    Resolved. in 53s- meds and counseling.   Diabetes mellitus (HCC)    Family history of hemochromatosis    father- screen iron   Gestational diabetes 09/08/1985   a1c q3 years   Hammer toe    Lichen planus     Sees [facility] dermatology - Dr. Denver   Morton's neuroma    per records,pt.denies 10/15/21   Wears glasses    Past Surgical History:  Procedure Laterality Date   DILATATION & CURETTAGE/HYSTEROSCOPY WITH MYOSURE N/A 03/03/2022   Procedure: hysterscopic myomectomy  dilation and curretage;  Surgeon: Cathlyn JAYSON Nikki Bobie FORBES, MD;  Location: [facility];  Service: Gynecology;  Laterality: N/A;  hysterscopic myomectomy dilation and curretage   ENDOMETRIAL ABLATION  2010   heavy menstrual bleeding   TONSILLECTOMY AND ADENOIDECTOMY  1965   Social History   Socioeconomic History   Marital status: Married    Spouse name: Not on file   Number of children: 2   Years of education: Not on file   Highest education level: Not on file  Occupational History    Librarian  Social Needs   Financial resource strain: Not on file   Food insecurity:    Worry: Not on file    Inability: Not on file   Transportation needs:    Medical: Not on file    Non-medical: Not on file  Tobacco Use   Smoking status: Never Smoker   Smokeless tobacco: Never Used  Substance and Sexual Activity   Alcohol use: Yes    Alcohol/week:  2-4 drinks per week    Types:  Wine/beer   Drug use: No   Sexual activity: Yes  Lifestyle   Physical activity: Walk/hike/spin class/weight resistance/stretching    Days per week: 7    Minutes per session: Not on file  Relationships  Social History Narrative   Married. (husband Lynwood, patient of Dr. Katrinka). 2 children grown and married. B/c grandmother December 2016.    Has had 12 moves in 32 years, most recently [place]  for 1.5 years, [place]  5 years prior, [place] prior      [place]. In nutrition. Masters in [employer]. In between jobs as Comptroller.       Current Outpatient Medications on File Prior to Visit  Medication Sig Dispense Refill   Accu-Chek FastClix Lancets MISC Use as instructed 2x a day 204 each 3   ACCU-CHEK GUIDE test strip USE AS INSTRUCTED  TO CHECK BLOOD SUGAR 4 TIMES A DAY - ACCUCHEK GUIDE 100 strip 3   acyclovir  ointment (ZOVIRAX ) 5 % Apply 1 application topically every 3 (three) hours. 30 g 1   BAQSIMI  TWO PACK 3 MG/DOSE POWD Use as needed for hypoglycemia 1 each PRN   BD PEN NEEDLE NANO 2ND GEN 32G X 4 MM MISC USE 6-8 TIMES A DAY FOR INSULIN  500 each 3   Blood Glucose Calibration (ACCU-CHEK GUIDE CONTROL) LIQD To calibrate Accu-Chek Guide meter 1 each 11   Cholecalciferol 50 MCG (2000 UT) CAPS Take 1 capsule by mouth once a week.     Continuous Glucose Sensor (DEXCOM G7 SENSOR) MISC 3 each by Does not apply route every 30 (thirty) days. Apply 1 sensor every 10 days 9 each 4   ferrous sulfate (FEROSUL) 325 (65 FE) MG tablet      Insulin  Aspart, w/Niacinamide, (FIASP  PENFILL) 100 UNIT/ML SOCT USE UP TO 20 UNITS A DAY AS ADVISED 15 mL 1   insulin  glargine (LANTUS SOLOSTAR) 100 UNIT/ML Solostar Pen Check on the skin up to 10 units daily, as advised.  To replace Basaglar . 30 mL 3   magnesium 30 MG tablet Activated magnesium     NOVOPEN ECHO DEVI Use as advised 1 each 3   triamcinolone cream (KENALOG) 0.1 % For autoimmune rash, not taking right now     vitamin B-12 (CYANOCOBALAMIN ) 250 MCG tablet Take 250 mcg by mouth daily. Takes it once a week     No current facility-administered medications on file prior to visit.   Allergies  Allergen Reactions  Penicillins Rash   Sulfa Antibiotics Rash   Family History  Problem Relation Age of Onset   Osteoporosis Mother    Depression Mother        dealing with some memory issues   Glaucoma Mother    Dementia Mother    Hyperlipidemia Mother    Hemochromatosis Father    Alcoholism Father    Alcohol abuse Father    Hyperlipidemia Father    Hypertension Father    Hearing loss Brother        otosclerosis   Hypertension Brother    Hyperlipidemia Brother    Diabetes Brother        ? PE as well- she will verify   Hyperlipidemia Brother    Stroke Other        grandparent    Hypertension Other        grandparent   Diabetes Maternal Grandmother    Stroke Maternal Grandmother    Stroke Paternal Grandfather    Hypertension Paternal Grandmother    Hypertension Brother    Hyperlipidemia Brother    Hypertension Brother    Diabetes Maternal Uncle    Obesity Maternal Uncle    Colon cancer Neg Hx    Colon polyps Neg Hx    Esophageal cancer Neg Hx    Rectal cancer Neg Hx    Stomach cancer Neg Hx    PE: BP 120/70   Pulse 64   Ht 5' (1.524 m)   Wt 126 lb (57.2 kg)   LMP 09/09/2007 (Approximate) Comment: this was with her endometrial ablation  SpO2 98%   BMI 24.61 kg/m  Wt Readings from Last 15 Encounters:  02/29/24 126 lb (57.2 kg)  02/26/24 124 lb (56.2 kg)  02/10/24 122 lb (55.3 kg)  01/07/24 123 lb 6.4 oz (56 kg)  11/03/23 122 lb 12.8 oz (55.7 kg)  08/25/23 121 lb 3.2 oz (55 kg)  07/07/23 120 lb 3.2 oz (54.5 kg)  05/14/23 123 lb (55.8 kg)  04/14/23 123 lb 9.6 oz (56.1 kg)  03/04/23 119 lb 9.6 oz (54.3 kg)  12/10/22 117 lb (53.1 kg)  11/14/22 120 lb (54.4 kg)  10/15/22 120 lb (54.4 kg)  08/22/22 117 lb 3.2 oz (53.2 kg)  06/13/22 117 lb 12.8 oz (53.4 kg)   Constitutional: normal weight, in NAD Eyes:  EOMI, no exophthalmos ENT: no neck masses, no cervical lymphadenopathy Cardiovascular: RRR, No MRG Respiratory: CTA B Musculoskeletal: no deformities Skin:no rashes Neurological: no tremor with outstretched hands Diabetic Foot Exam - Simple   Simple Foot Form Diabetic Foot exam was performed with the following findings: Yes 02/29/2024 11:04 AM  Visual Inspection No deformities, no ulcerations, no other skin breakdown bilaterally: Yes Sensation Testing Intact to touch and monofilament testing bilaterally: Yes Pulse Check Posterior Tibialis and Dorsalis pulse intact bilaterally: Yes Comments    ASSESSMENT: 1.  LADA, controlled, without long-term complications but with occasional hyperglycemia  She had low C-peptide and high GAD  antibodies: Component     Latest Ref Rng & Units 03/17/2019  Glucose     65 - 99 mg/dL 807 (H)  C-Peptide     0.80 - 3.85 ng/mL 0.55 (L)   Component     Latest Ref Rng & Units 11/08/2018  Glucose     70 - 99 mg/dL 869 (H)  C-Peptide     0.80 - 3.85 ng/mL 0.55 (L)  Glutamic Acid Decarb Ab     <5 IU/mL >250 (H)  Insulin  Antibodies, Human     <  0.4 U/mL 3.6 (H)   Component     Latest Ref Rng & Units 11/30/2018  ZNT8 Antibodies     U/mL <15  Islet Cell Ab     Neg:<1:1 Negative   2. HL  3. Hyponatremia -She is asymptomatic: no headaches, mental, confusion -Her sodium usually fluctuates between 126 and 133-mild hyponatremia.  Latest level was reviewed from last visit with PCP and this was slightly low, at 131, stable. 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 -After fluid restriction, if sodium level = normal >> most likely SIADH.  If sodium = still low >> possible reset osmostat.  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  PLAN:  1. Patient with history of well-controlled type 1 diabetes, diagnosed in 2020.  She is on a plant-based diet, with low glycemic index foods and she usually exercises consistently.  She was able to avoid starting insulin  for a long time but we ended up adding mealtime insulin  in 2020 and long-acting insulin  in 2021.  She is very insulin  sensitive and we are using very low doses of insulin  for her.  At last visit, sugars were still fluctuating within the target range, with only occasional hyperglycemic values especially after dinner,  but they were not very frequent.  She had occasional low blood sugars after dinner and in the middle of the night and we discussed about trying to reduce the amount of insulin  used for correction of high blood sugars at night.  I advised her to use no more than 1 unit for correction at night as she sometimes was going up to 1.5 to 2 units and almost invariably she was dropping her blood sugars too low after this.  I did advise her that if the sugars remained low overnight after this change, to reduce the Lantus dose to only 3 units.  Otherwise, we did not change her regimen.  Of note, she does have a Fiasp  pen with cartridges and she can inject half units if needed. CGM interpretation: -At today's visit, we reviewed her CGM downloads: It appears that 86% of values are in target range (goal >70%), while 12% are higher than 180 (goal <25%), and 2% are lower than 70 (goal <4%).  The calculated average blood sugar is 128.  The projected HbA1c for the next 3 months (GMI) is 6.4%. -Reviewing the CGM trends, sugars appear to be fluctuating within the target range mostly, with only occasional hyperglycemic spikes but also occasional drops in blood sugars mostly after correcting high blood sugars.  Upon questioning, she was not using the sliding scale for Fiasp  correctly, and we discussed about targeting 150 with an insulin  sensitivity factor of 50.  However, I advised her that at night, to only correct with 0.5 to 1 unit of insulin  and not necessarily go by the sliding scale, to avoid low blood sugars during the night. -She has many questions at today's visit, of which the most pressing for her was how to deal with high-fat meals.  We discussed that she cannot do extended boluses without an insulin   pump, obviously, but we did discuss about different strategies to be able to inject insulin  based on all of the carbs without dropping soon after injecting.  I advised her to do a dual bolus, based on the entire amount of  carbs used, but to split this 70% - 30%, over 2 to 4 hours to start with.  I did advise her that she can modify this based on the pattern of blood sugars and also the trends of her CGM. -We also discussed about when to use more and less Lantus -recommended 3 units of insulin  when she has a period of time that she is more active, and do not adjust the dose based on the sugars at bedtime due to the risk of hypoglycemia in the next day. -I refilled her Lantus and Fiasp  prescriptions; also, I refilled her glucometer, as she feels that this is giving her inaccurate blood sugars - I advised her to: Patient Instructions  Please use: - Lantus 3 (when more active)-4 units in am and 3 (when more active)-4 units at bedtime - FiAsp : B'fast: 1:10  Lunch: 1:15 (-20) Dinner: 1:15 (-20) - FiAsp  sliding scale:  Target: 150 Correction factor: 50 (at night, try to use only 0.5-1 unit for correction)  You can split the bolus before a high fat meal: starting with 70%-30% over ~2-4 hours.  Please return in 4-6 months.  - advised to check sugars at different times of the day - 4x a day, rotating check times - advised for yearly eye exams >> she is UTD - return to clinic in 4-6 months  2. HL - Latest lipid panel was reviewed from 08/2023: All fractions at goal: Lab Results  Component Value Date   CHOL 163 08/25/2023   HDL 88.40 08/25/2023   LDLCALC 65 08/25/2023   TRIG 47.0 08/25/2023   CHOLHDL 2 08/25/2023  - She is not on a statin but has excellent lipid fractions, on a plant-based diet  3.  Hyponatremia - Latest sodium level was at baseline, at 130 - At last visit, we calculated that she was drinking 72 ounces of fluids daily (2.1 L) and we discussed that ideally, she would need to reduce this to 1 L a day but I advised her to start reducing her daily intake by 12 ounces and then by 24 ounces, if possible. - she does mention decreasing the amount of fluid she drinks, but she is not sure exactly how  much per day - Will continue to keep an eye on her sodium for now  Lela Fendt, MD PhD [facility]

## 2024-03-01 NOTE — Progress Notes (Signed)
Results seen in my chart 

## 2024-03-04 ENCOUNTER — Other Ambulatory Visit: Payer: Self-pay

## 2024-03-04 MED ORDER — BD PEN NEEDLE NANO 2ND GEN 32G X 4 MM MISC: 5 refills | Status: DC

## 2024-03-28 LAB — HM DIABETES EYE EXAM

## 2024-03-29 ENCOUNTER — Encounter: Payer: Self-pay | Admitting: Family Medicine

## 2024-05-12 ENCOUNTER — Other Ambulatory Visit: Payer: Self-pay | Admitting: Internal Medicine

## 2024-05-12 NOTE — Telephone Encounter (Signed)
 Refill request complete

## 2024-06-25 ENCOUNTER — Encounter: Payer: Self-pay | Admitting: Family Medicine

## 2024-06-25 DIAGNOSIS — M7660 Achilles tendinitis, unspecified leg: Secondary | ICD-10-CM

## 2024-07-05 ENCOUNTER — Other Ambulatory Visit: Payer: Self-pay

## 2024-07-05 ENCOUNTER — Ambulatory Visit: Admitting: Family Medicine

## 2024-07-05 VITALS — BP 128/84 | HR 61 | Ht 60.0 in | Wt 129.0 lb

## 2024-07-05 DIAGNOSIS — M7661 Achilles tendinitis, right leg: Secondary | ICD-10-CM | POA: Insufficient documentation

## 2024-07-05 NOTE — Patient Instructions (Addendum)
 Thank you for coming in today.   Please use Voltaren gel (Generic Diclofenac Gel) up to 4x daily for pain as needed.  This is available over-the-counter as both the name brand Voltaren gel and the generic diclofenac gel.   Please work on the home exercises the athletic trainer went over with you:  View at [email or web address] code [email or web address]  Try icing   Heel lift  Check back in 1 month

## 2024-07-05 NOTE — Progress Notes (Signed)
   LILLETTE Ileana Collet, PhD, LAT, ATC acting as a scribe for Artist Lloyd, MD.  Brittany Barton is a 63 y.o. female who presents to [facility] today for R Achilles pain ongoing since around 9/30. Pain started after a chiro adjustment for her plantar fascitis. Pt locates pain to middle of the Achilles and TTP.  Swelling: yes Aggravates: prolonged walking,  Treatments tried: ice, wearing shoes w/ a heel,   Pertinent review of systems: No fevers or chills  Relevant historical information: Type 1 diabetes   Exam:  BP 128/84   Pulse 61   Ht 5' (1.524 m)   Wt 129 lb (58.5 kg)   LMP 09/09/2007 (Approximate) Comment: this was with her endometrial ablation  SpO2 100%   BMI 25.19 kg/m  General: Well Developed, well nourished, and in no acute distress.   MSK: Right foot normal-appearing Tender palpation posterior calcaneus at Achilles tendon.  Normal foot and ankle motion. Intact strength.    Lab and Radiology Results  Diagnostic Limited MSK Ultrasound of: Right Achilles tendon Achilles tendon is thicker approximately 2 cm proximal to insertion on the calcaneus consistent with Achilles tendinitis.  No definitive tear is visible.  Increased vascular activity concerning for tendinopathy is present as well. Impression: Achilles tendinitis right     Assessment and Plan: 63 y.o. female with right Achilles tendinitis.  Plan to maximize conservative management.  Recommend strengthening exercises ice Voltaren gel and heel lift.  Recheck in a month   PDMP not reviewed this encounter. Orders Placed This Encounter  Procedures   US  LIMITED JOINT SPACE STRUCTURES LOW RIGHT(NO LINKED CHARGES)    Reason for Exam (SYMPTOM  OR DIAGNOSIS REQUIRED):   right Achilles pain    Preferred imaging location?:   [facility]   No orders of the defined types were placed in this encounter.    Discussed warning signs or symptoms. Please see  discharge instructions. Patient expresses understanding.   The above documentation has been reviewed and is accurate and complete Artist Lloyd, M.D.

## 2024-07-25 ENCOUNTER — Encounter: Payer: Self-pay | Admitting: Internal Medicine

## 2024-07-25 ENCOUNTER — Ambulatory Visit: Admitting: Internal Medicine

## 2024-07-25 VITALS — BP 106/64 | HR 69 | Ht 60.0 in | Wt 126.2 lb

## 2024-07-25 DIAGNOSIS — E1065 Type 1 diabetes mellitus with hyperglycemia: Secondary | ICD-10-CM

## 2024-07-25 DIAGNOSIS — E785 Hyperlipidemia, unspecified: Secondary | ICD-10-CM

## 2024-07-25 DIAGNOSIS — E871 Hypo-osmolality and hyponatremia: Secondary | ICD-10-CM

## 2024-07-25 LAB — POCT GLYCOSYLATED HEMOGLOBIN (HGB A1C): Hemoglobin A1C: 5.6 % (ref 4.0–5.6)

## 2024-07-25 NOTE — Patient Instructions (Addendum)
 Please use: - Lantus  3 (when more active)-4 units in am and 3-4 units at bedtime - FiAsp : B'fast: 1:10  Lunch: 1:15 (-20) Dinner: 1:15 (-20) - FiAsp  sliding scale:  Target: 150 Correction factor: 50 (at night, try to use only 0.5-1 unit for correction)  You can split the bolus before a high fat meal: starting with 70%-30% over ~2-4 hours.  Look up: - Diabetes sisters - [facility]ca [place] - T1DM support (4th floor)  Please return in 4-6 months.

## 2024-07-25 NOTE — Addendum Note (Signed)
 Addended by: CLEOTILDE ROLIN RAMAN on: 07/25/2024 11:33 AM   Modules accepted: Orders

## 2024-07-25 NOTE — Progress Notes (Signed)
 Patient ID: Brittany Barton, female   DOB: 06/19/1961, 63 y.o.   MRN: [id number]   HPI: Brittany Barton is a 63 y.o.-year-old female, initially referred by her PCP, Dr. Katrinka, returning for follow-up for DM1, GDM in 1987, dx in 07/2017 as DM2 and in 11/2018 as DM1, insulin -dependent, well controlled, without long-term complications.  Last visit 5 months ago.  Interim history: No increased urination, blurry vision, chest pain.  She continues to complain of weight gain.  She gained 4 pounds since last visit. However, she feels burnt out from the intense care that her diabetes requires.  She is interested in support groups.  Reviewed HbA1c levels: Lab Results  Component Value Date   HGBA1C 5.8 02/26/2024   HGBA1C 6.1 (A) 11/03/2023   HGBA1C 6.2 (A) 07/07/2023   HGBA1C 6.0 (A) 03/04/2023   HGBA1C 6.0 (A) 10/15/2022   HGBA1C 5.9 (A) 06/13/2022   HGBA1C 5.6 02/10/2022   HGBA1C 6.1 (A) 10/21/2021   HGBA1C 5.7 (A) 06/18/2021   HGBA1C 5.8 (A) 02/19/2021   She is on: - Lantus  3 (when more active)-4 units in am and 3 (when more active)-4 units at bedtime - FiAsp : B'fast: 1:10  Lunch: 1:15 (-20) Dinner: 1:15 (-20) - FiAsp  sliding scale:  Target: 150 Correction factor: 50 (at night, try to use only 0.5-1 unit for correction) You may need 1-2 units of insulin  when you wake up. You may need 1-2 units of insulin  when you wake up. Inject FiAsp  10-15 min before coffee. Previously on Lantus , Humalog , and Lyumjev .  She checks her sugars more than 4 times a day with her Dexcom CGM:  Previously:  Previously:  Lowest sugar was 40 >> ...   52 >> 50s >> 50s; she has hypoglycemia awareness at 70.  She keeps glucose tablets on the nightstand and juice in the fridge. Highest sugar was 330 >> ... 200s.  Glucometer: Accuchek   She continues on a plant-based diet without high glycemic index foods. In 02/2015 she started a plant-based diet and lost more than 80 pounds.  Pt's meals are: -  Breakfast: Raw/soaked oatmeal, blueberries or other fruit, ground flaxseed, walnuts (2 tablespoons), soy milk - Lunch: Beans, whole grains, leafy greens, other vegetables and fruit - Dinner: Salad with beans/peas or vegetable-based soup. - Snacks: 0-2-fruit, veggies, homemade bean dip, PB She uses Chronometer app for carb counting.  -No CKD, last BUN/creatinine:  Lab Results  Component Value Date   BUN 7 02/26/2024   BUN 9 01/20/2024   CREATININE 0.51 02/26/2024   CREATININE 0.57 01/20/2024   Lab Results  Component Value Date   MICRALBCREAT Unable to calculate 02/26/2024  Not on ACE inhibitor/ARB.  -+ History of HL, diet controlled; last set of lipids: Lab Results  Component Value Date   CHOL 163 08/25/2023   HDL 88.40 08/25/2023   LDLCALC 65 08/25/2023   TRIG 47.0 08/25/2023   CHOLHDL 2 08/25/2023  She declined statins.  - last eye exam was 03/28/2024: No DR  - NO numbness and tingling in her feet. Last foot exam: 02/29/2024.  Pt has FH of DM in MGM.  Hyponatremia: She has a history of low sodium: Lab Results  Component Value Date   NA 130 (L) 02/26/2024   NA 132 (L) 01/20/2024   NA 133 (L) 11/18/2023   NA 129 (L) 08/25/2023   NA 131 (L) 08/22/2022   NA 131 (L) 03/03/2022   NA 129 (L) 08/21/2021   NA 133 (L) 02/19/2021  NA 130 (L) 08/16/2020   NA 126 (L) 08/29/2019   NA 126 (L) 08/15/2019   NA 130 (L) 03/17/2019   NA 133 (L) 11/08/2018   NA 129 (L) 08/04/2018   NA 134 (L) 08/06/2017   NA 134 (L) 07/29/2016   NA 136 07/31/2015   NA 139 05/24/2013   Investigation for hyponatremia reviewed: Component     Latest Ref Rng & Units 08/16/2020  Glucose     65 - 99 mg/dL 60 (L)  BUN     7 - 25 mg/dL 8  Creatinine     9.49 - 0.99 mg/dL 9.49  GFR, Est Non African American     > OR = 60 mL/min/1.86m2 105  Sodium     135 - 146 mmol/L 130 (L)  Potassium     3.5 - 5.3 mmol/L 4.6  Chloride     98 - 110 mmol/L 94 (L)  Osmolality, Urine     50 - 1,200 mOsm/kg  142  Osmolality     278 - 305 mOsm/kg 268 (L)  Sodium, Urine     28 - 272 mmol/L 33   Plasma sodium was 130, slightly low >> hyponatremia Plasma osmolality was 268, lower than 275-280 >> hypoosmolar hyponatremia Urine sodium was 33, not very low >> ruling out excessive fluid losses (chronic diuretics, GI losses, excessive perspiration) or water overload Urine osmolality was 142, higher than 100 >> inappropriately high for hyponatremia  We ruled out adrenal insufficiency by a normal cosyntropin  stimulation test: Component     Latest Ref Rng & Units 11/30/2018 11/30/2018 11/30/2018         8:15 AM  8:59 AM  9:26 AM  Cortisol, Plasma     ug/dL 83.1 75.7 71.9  R793 ACTH      6 - 50 pg/mL 15     She does not have hypothyroidism: Lab Results  Component Value Date   TSH 1.04 02/26/2024   TSH 1.07 11/08/2018   TSH 1.36 07/29/2016   TSH 2.41 07/31/2015   She is not on HCTZ, carbamazepine, amitriptyline, SSRIs, opiates. No history of brain injury. No history of generalized infections. No history of malignancy. No recent brain or chest imaging. No chronic kidney disease.  At previous visits, I advised her to try to reduce her fluid intake to 1 L a day.  She was able to limit her intake to 1.5 L a day, but this was difficult for her to do. She had a lot of fluid retention during her COVID-19 episode from 01/2021 and after taking the antiviral medication she eliminated this (approximately 7 to 8 pounds).  Mother also has a history of hyponatremia. Brother with otosclerosis.  ROS: + See HPI  I reviewed pt's medications, allergies, PMH, social hx, family hx, and changes were documented in the history of present illness. Otherwise, unchanged from my initial visit note.  Past Medical History:  Diagnosis Date   Anemia    Anxiety 2023   Chicken pox    COVID    may 2022 cold s/s cough . blood sugars out of control.   COVID 10/2022   Depression    Resolved. in 13s- meds and counseling.    Diabetes mellitus (HCC)    Family history of hemochromatosis    father- screen iron   Gestational diabetes 09/08/1985   a1c q3 years   Hammer toe    Lichen planus    Sees [facility] dermatology - Dr. Denver   Morton's neuroma  per records,pt.denies 10/15/21   Wears glasses    Past Surgical History:  Procedure Laterality Date   DILATATION & CURETTAGE/HYSTEROSCOPY WITH MYOSURE N/A 03/03/2022   Procedure: hysterscopic myomectomy dilation and curretage;  Surgeon: Cathlyn JAYSON Nikki Bobie FORBES, MD;  Location: [facility];  Service: Gynecology;  Laterality: N/A;  hysterscopic myomectomy dilation and curretage   ENDOMETRIAL ABLATION  2010   heavy menstrual bleeding   TONSILLECTOMY AND ADENOIDECTOMY  1965   Social History   Socioeconomic History   Marital status: Married    Spouse name: Not on file   Number of children: 2   Years of education: Not on file   Highest education level: Not on file  Occupational History    Librarian  Social Needs   Financial resource strain: Not on file   Food insecurity:    Worry: Not on file    Inability: Not on file   Transportation needs:    Medical: Not on file    Non-medical: Not on file  Tobacco Use   Smoking status: Never Smoker   Smokeless tobacco: Never Used  Substance and Sexual Activity   Alcohol use: Yes    Alcohol/week:  2-4 drinks per week    Types:  Wine/beer   Drug use: No   Sexual activity: Yes  Lifestyle   Physical activity: Walk/hike/spin class/weight resistance/stretching    Days per week: 7    Minutes per session: Not on file  Relationships  Social History Narrative   Married. (husband Lynwood, patient of Dr. Katrinka). 2 children grown and married. B/c grandmother December 2016.    Has had 12 moves in 32 years, most recently [place]  for 1.5 years, [place]  5 years prior, [place] prior      [place]. In nutrition. Masters in [employer]. In between jobs as comptroller.       Current Outpatient Medications  on File Prior to Visit  Medication Sig Dispense Refill   Accu-Chek FastClix Lancets MISC Use as instructed 2x a day 204 each 3   acyclovir  ointment (ZOVIRAX ) 5 % Apply 1 application topically every 3 (three) hours. 30 g 1   BAQSIMI  TWO PACK 3 MG/DOSE POWD Use as needed for hypoglycemia 1 each PRN   BD PEN NEEDLE NANO 2ND GEN 32G X 4 MM MISC USE 6-8 TIMES A DAY FOR INSULIN  500 each 3   Blood Glucose Calibration (ACCU-CHEK GUIDE CONTROL) LIQD To calibrate Accu-Chek Guide meter 1 each 11   Blood Glucose Monitoring Suppl (ACCU-CHEK GUIDE) w/Device KIT Use as advised 1 kit 0   Cholecalciferol 50 MCG (2000 UT) CAPS Take 1 capsule by mouth once a week.     Continuous Glucose Sensor (DEXCOM G7 SENSOR) MISC 3 each by Does not apply route every 30 (thirty) days. Apply 1 sensor every 10 days 9 each 4   ferrous sulfate (FEROSUL) 325 (65 FE) MG tablet      glucose blood test strip Use as instructed 2x a day 100 each 3   Insulin  Aspart, w/Niacinamide, (FIASP  PENFILL) 100 UNIT/ML SOCT USE UP TO 20 UNITS A DAY AS ADVISED 30 mL 3   insulin  glargine (LANTUS  SOLOSTAR) 100 UNIT/ML Solostar Pen Check on the skin up to 10 units daily, as advised. 15 mL 3   magnesium 30 MG tablet Activated magnesium     NOVOPEN ECHO DEVI Use as advised 1 each 3   triamcinolone cream (KENALOG) 0.1 % For autoimmune rash, not taking right now     vitamin B-12 (  CYANOCOBALAMIN ) 250 MCG tablet Take 250 mcg by mouth daily. Takes it once a week     No current facility-administered medications on file prior to visit.   Allergies  Allergen Reactions   Penicillins Rash   Sulfa Antibiotics Rash   Family History  Problem Relation Age of Onset   Osteoporosis Mother    Depression Mother        dealing with some memory issues   Glaucoma Mother    Dementia Mother    Hyperlipidemia Mother    Hemochromatosis Father    Alcoholism Father    Alcohol abuse Father    Hyperlipidemia Father    Hypertension Father    Hearing loss Brother         otosclerosis   Hypertension Brother    Hyperlipidemia Brother    Diabetes Brother        ? PE as well- she will verify   Hyperlipidemia Brother    Stroke Other        grandparent   Hypertension Other        grandparent   Diabetes Maternal Grandmother    Stroke Maternal Grandmother    Stroke Paternal Grandfather    Hypertension Paternal Grandmother    Hypertension Brother    Hyperlipidemia Brother    Hypertension Brother    Diabetes Maternal Uncle    Obesity Maternal Uncle    Colon cancer Neg Hx    Colon polyps Neg Hx    Esophageal cancer Neg Hx    Rectal cancer Neg Hx    Stomach cancer Neg Hx    PE: BP 106/64   Pulse 69   Ht 5' (1.524 m)   Wt 126 lb 3.2 oz (57.2 kg)   LMP 09/09/2007 (Approximate) Comment: this was with her endometrial ablation  SpO2 99%   BMI 24.65 kg/m  Wt Readings from Last 15 Encounters:  07/25/24 126 lb 3.2 oz (57.2 kg)  07/05/24 129 lb (58.5 kg)  02/29/24 126 lb (57.2 kg)  02/26/24 124 lb (56.2 kg)  02/10/24 122 lb (55.3 kg)  01/07/24 123 lb 6.4 oz (56 kg)  11/03/23 122 lb 12.8 oz (55.7 kg)  08/25/23 121 lb 3.2 oz (55 kg)  07/07/23 120 lb 3.2 oz (54.5 kg)  05/14/23 123 lb (55.8 kg)  04/14/23 123 lb 9.6 oz (56.1 kg)  03/04/23 119 lb 9.6 oz (54.3 kg)  12/10/22 117 lb (53.1 kg)  11/14/22 120 lb (54.4 kg)  10/15/22 120 lb (54.4 kg)   Constitutional: normal weight, in NAD Eyes:  EOMI, no exophthalmos ENT: no neck masses, no cervical lymphadenopathy Cardiovascular: RRR, No MRG Respiratory: CTA B Musculoskeletal: no deformities Skin:no rashes Neurological: no tremor with outstretched hands  ASSESSMENT: 1.  LADA, controlled, without long-term complications but with occasional hyperglycemia  She had low C-peptide and high GAD antibodies: Component     Latest Ref Rng & Units 03/17/2019  Glucose     65 - 99 mg/dL 807 (H)  C-Peptide     0.80 - 3.85 ng/mL 0.55 (L)   Component     Latest Ref Rng & Units 11/08/2018  Glucose     70 - 99  mg/dL 869 (H)  C-Peptide     0.80 - 3.85 ng/mL 0.55 (L)  Glutamic Acid Decarb Ab     <5 IU/mL >250 (H)  Insulin  Antibodies, Human     <0.4 U/mL 3.6 (H)   Component     Latest Ref Rng & Units 11/30/2018  ZNT8 Antibodies  U/mL <15  Islet Cell Ab     Neg:<1:1 Negative   2. HL  3. Hyponatremia -She is asymptomatic: no headaches, dizziness, confusion -Her sodium usually fluctuates between 126 and 133-mild hyponatremia.  Latest level was reviewed from last visit with PCP and this was slightly low, at 131, stable. 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 -After fluid restriction, if sodium level = normal >> most likely SIADH.  If sodium = still low >> possible reset osmostat.  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  PLAN:  1. Patient with history of well-controlled type 1 diabetes, diagnosed in 2020.  She is on a plant-based diet with low glycemic index foods and she exercises consistently.  She was able to avoid starting insulin  for a long time but we ended up adding mealtime insulin  2020 and long-acting insulin  2021.  She is very insulin  sensitive and we are using very low doses of insulin  for her.  Sugars are usually fluctuating within the target range.  At last visit, she had occasional hyperglycemic spikes but also occasional drops in blood sugars mostly after correcting hyperglycemic values.  Upon questioning, she was not using the sliding scale correctly and we discussed about targeting 150 with an insulin  sensitivity factor of 50.  I advised her that  at night, she should only correct with 0.5 to 1 unit of insulin  and not necessarily go by the sliding scale, to avoid low blood sugars during the night.  At last visit, she had many questions especially regarding how to deal with high-fat meal.  She obviously could not extend boluses without an insulin  pump but we discussed different strategies to be able to inject insulin  based on all of the carbs without dropping soon after injecting.  I recommended to do all bolus, based on the entire amount of carbs used, but to split this 70%-30% over 2 to 4 hours to start with.  We discussed about modifying this based on the pattern of blood sugars and also the trends of her CGM.  We also discussed about when to use more less Lantus -recommended 3 units of insulin  when she had a period of time when she was more active, and to not adjust the dose of Lantus  based on sugars at bedtime.  At that time, I refilled her Lantus , Fiasp , glucometer prescriptions. CGM interpretation: -At today's visit, we reviewed her CGM downloads: It appears that 81% of values are in target range (goal >70%), while 13% are higher than 180 (goal <25%), and 6% are lower than 70 (goal <4%).  The calculated average blood sugar is 125.  The projected HbA1c for the next 3 months (GMI) is 6.3%. -Reviewing the CGM trends, sugars appear to be slightly more fluctuating in the last 2 weeks, but still mostly at goal.  In the previous 2 weeks they were more controlled.  Upon questioning, she tried to increase her Lantus  to 4 units every night but this appears to drop her too low towards early morning.  Due to this, she is trying to bring her blood sugars  when sugars are higher after lunch.  They are slightly more fluctuating after dinner.  At today's visit, I advised her to continue with 3 units of Lantus  at night and only use 4 units when she feels that she needs it, after a higher protein, higher fat meal.  And the rest of the day, I feel that she is doing an  excellent job with her insulin  injections and the doses are adequate. -We did discuss about possibly switching to an insulin  pump but she uses low doses of insulin  so this would be problematic.  One option is to use diluted insulin , U-50 or U-20, this will need to be compounded by the pharmacy.  [facility] and [facility] can supply the sterile diluent . - at today's visit I recommended to support groups for type 1 diabetes - I advised her to: Patient Instructions  Please use: - Lantus  3 (when more active)-4 units in am and 3-4 units at bedtime - FiAsp : B'fast: 1:10  Lunch: 1:15 (-20) Dinner: 1:15 (-20) - FiAsp  sliding scale:  Target: 150 Correction factor: 50 (at night, try to use only 0.5-1 unit for correction)  You can split the bolus before a high fat meal: starting with 70%-30% over ~2-4 hours.  Look up: - Diabetes sisters - [facility] [place] - T1DM support (4th floor)  Please return in 4-6 months.  - we checked her HbA1c: 5.6% (lower, excellent) - advised to check sugars at different times of the day - 4x a day, rotating check times - advised for yearly eye exams >> she is UTD - return to clinic in 4-6 months  2. HL - Latest lipid panel was reviewed from 08/2023: Fractions at goal: Lab Results  Component Value Date   CHOL 163 08/25/2023   HDL 88.40 08/25/2023   LDLCALC 65 08/25/2023   TRIG 47.0 08/25/2023   CHOLHDL 2 08/25/2023  - She is not on a statin but is on a plant-based diet  3.  Hyponatremia - Latest sodium level was slightly low, at 130, in 02/2024 - At previous visits, we calculated that she was drinking 72 ounces of fluids daily (2.1 L) and we discussed that ideally, she would need to reduce this to 1 L a day but I advised her to start reducing her daily intake by 12 ounces and then by 24 ounces, if possible. - she does mention decreasing the amount of fluid she drinks, but she is not sure exactly how much per day - Will continue to keep an eye on her sodium  for now  Lela Fendt, MD PhD [facility] Endocrinology

## 2024-08-09 ENCOUNTER — Ambulatory Visit: Admitting: Family Medicine

## 2024-12-07 ENCOUNTER — Ambulatory Visit: Admitting: Internal Medicine

## 2025-02-14 ENCOUNTER — Ambulatory Visit: Admitting: Obstetrics and Gynecology

## 2025-02-28 ENCOUNTER — Encounter: Admitting: Family Medicine
